# Patient Record
Sex: Female | Born: 1935 | Race: White | Hispanic: No | Smoking: Never smoker
Health system: Southern US, Community
[De-identification: ages and names within clinical notes are randomized; demographics above are authoritative.]

## PROBLEM LIST (undated history)

## (undated) DIAGNOSIS — J45909 Unspecified asthma, uncomplicated: Secondary | ICD-10-CM

## (undated) DIAGNOSIS — M1990 Unspecified osteoarthritis, unspecified site: Secondary | ICD-10-CM

## (undated) DIAGNOSIS — I509 Heart failure, unspecified: Secondary | ICD-10-CM

## (undated) DIAGNOSIS — M25561 Pain in right knee: Secondary | ICD-10-CM

## (undated) DIAGNOSIS — M549 Dorsalgia, unspecified: Secondary | ICD-10-CM

## (undated) DIAGNOSIS — D469 Myelodysplastic syndrome, unspecified: Secondary | ICD-10-CM

## (undated) DIAGNOSIS — E039 Hypothyroidism, unspecified: Secondary | ICD-10-CM

## (undated) DIAGNOSIS — I10 Essential (primary) hypertension: Secondary | ICD-10-CM

## (undated) DIAGNOSIS — I639 Cerebral infarction, unspecified: Secondary | ICD-10-CM

## (undated) DIAGNOSIS — Z8739 Personal history of other diseases of the musculoskeletal system and connective tissue: Secondary | ICD-10-CM

## (undated) DIAGNOSIS — R011 Cardiac murmur, unspecified: Secondary | ICD-10-CM

## (undated) DIAGNOSIS — R12 Heartburn: Secondary | ICD-10-CM

## (undated) DIAGNOSIS — G8929 Other chronic pain: Secondary | ICD-10-CM

## (undated) HISTORY — PX: OTHER SURGICAL HISTORY: SHX169

## (undated) HISTORY — PX: LUMBAR SPINE SURGERY: SHX701

## (undated) HISTORY — PX: TONSILLECTOMY: SUR1361

## (undated) HISTORY — PX: CORONARY ANGIOPLASTY: SHX604

## (undated) HISTORY — DX: Cerebral infarction, unspecified: I63.9

## (undated) HISTORY — PX: KNEE ARTHROSCOPY: SHX127

## (undated) HISTORY — DX: Heartburn: R12

## (undated) HISTORY — DX: Unspecified osteoarthritis, unspecified site: M19.90

---

## 1997-08-12 HISTORY — PX: CORONARY ARTERY BYPASS GRAFT: SHX141

## 1998-01-06 ENCOUNTER — Other Ambulatory Visit: Admission: RE | Admit: 1998-01-06 | Discharge: 1998-01-06 | Payer: Self-pay | Admitting: *Deleted

## 1998-01-09 ENCOUNTER — Inpatient Hospital Stay (HOSPITAL_COMMUNITY): Admission: AD | Admit: 1998-01-09 | Discharge: 1998-01-17 | Payer: Self-pay | Admitting: *Deleted

## 2004-04-09 ENCOUNTER — Encounter: Admission: RE | Admit: 2004-04-09 | Discharge: 2004-04-09 | Payer: Self-pay | Admitting: Neurosurgery

## 2004-04-23 ENCOUNTER — Encounter: Admission: RE | Admit: 2004-04-23 | Discharge: 2004-04-23 | Payer: Self-pay | Admitting: Neurosurgery

## 2004-07-10 ENCOUNTER — Ambulatory Visit: Payer: Self-pay

## 2004-07-13 ENCOUNTER — Ambulatory Visit: Payer: Self-pay

## 2004-08-14 ENCOUNTER — Inpatient Hospital Stay (HOSPITAL_COMMUNITY): Admission: RE | Admit: 2004-08-14 | Discharge: 2004-08-16 | Payer: Self-pay | Admitting: Neurosurgery

## 2005-01-10 ENCOUNTER — Ambulatory Visit: Payer: Self-pay | Admitting: Internal Medicine

## 2005-01-31 ENCOUNTER — Ambulatory Visit: Payer: Self-pay | Admitting: Internal Medicine

## 2005-03-29 ENCOUNTER — Ambulatory Visit: Payer: Self-pay | Admitting: Internal Medicine

## 2005-04-12 ENCOUNTER — Ambulatory Visit: Payer: Self-pay | Admitting: Internal Medicine

## 2005-06-11 ENCOUNTER — Ambulatory Visit: Payer: Self-pay | Admitting: *Deleted

## 2005-08-14 ENCOUNTER — Ambulatory Visit: Payer: Self-pay | Admitting: *Deleted

## 2006-03-25 ENCOUNTER — Ambulatory Visit: Payer: Self-pay | Admitting: Cardiology

## 2006-05-19 ENCOUNTER — Ambulatory Visit: Payer: Self-pay | Admitting: Vascular Surgery

## 2006-05-24 ENCOUNTER — Inpatient Hospital Stay: Payer: Self-pay | Admitting: Internal Medicine

## 2006-05-29 ENCOUNTER — Ambulatory Visit: Payer: Self-pay | Admitting: Internal Medicine

## 2006-07-09 ENCOUNTER — Ambulatory Visit: Payer: Self-pay | Admitting: Internal Medicine

## 2006-07-09 ENCOUNTER — Emergency Department: Payer: Self-pay | Admitting: Emergency Medicine

## 2006-07-09 ENCOUNTER — Other Ambulatory Visit: Payer: Self-pay

## 2006-11-25 ENCOUNTER — Ambulatory Visit: Payer: Self-pay | Admitting: Vascular Surgery

## 2007-05-19 ENCOUNTER — Ambulatory Visit: Payer: Self-pay | Admitting: Vascular Surgery

## 2007-08-07 ENCOUNTER — Ambulatory Visit: Payer: Self-pay | Admitting: Internal Medicine

## 2008-03-25 ENCOUNTER — Ambulatory Visit: Payer: Self-pay | Admitting: Internal Medicine

## 2008-04-25 ENCOUNTER — Encounter: Admission: RE | Admit: 2008-04-25 | Discharge: 2008-04-25 | Payer: Self-pay | Admitting: Neurosurgery

## 2008-06-07 ENCOUNTER — Ambulatory Visit: Payer: Self-pay | Admitting: Vascular Surgery

## 2008-12-17 ENCOUNTER — Emergency Department: Payer: Self-pay | Admitting: Unknown Physician Specialty

## 2009-01-03 ENCOUNTER — Ambulatory Visit: Payer: Self-pay | Admitting: Unknown Physician Specialty

## 2009-01-20 ENCOUNTER — Ambulatory Visit: Payer: Self-pay | Admitting: Internal Medicine

## 2009-01-23 ENCOUNTER — Ambulatory Visit: Payer: Self-pay | Admitting: Pain Medicine

## 2009-01-31 ENCOUNTER — Ambulatory Visit: Payer: Self-pay | Admitting: Pain Medicine

## 2009-02-15 ENCOUNTER — Ambulatory Visit: Payer: Self-pay | Admitting: Physician Assistant

## 2009-02-21 ENCOUNTER — Ambulatory Visit: Payer: Self-pay | Admitting: Pain Medicine

## 2009-03-08 ENCOUNTER — Ambulatory Visit: Payer: Self-pay | Admitting: Physician Assistant

## 2009-03-15 ENCOUNTER — Ambulatory Visit: Payer: Self-pay | Admitting: Pain Medicine

## 2009-03-21 ENCOUNTER — Ambulatory Visit: Payer: Self-pay | Admitting: Pain Medicine

## 2009-04-04 ENCOUNTER — Ambulatory Visit: Payer: Self-pay | Admitting: Physician Assistant

## 2009-04-11 ENCOUNTER — Ambulatory Visit: Payer: Self-pay | Admitting: Pain Medicine

## 2009-04-26 ENCOUNTER — Ambulatory Visit: Payer: Self-pay | Admitting: Physician Assistant

## 2009-06-23 ENCOUNTER — Ambulatory Visit: Payer: Self-pay | Admitting: Vascular Surgery

## 2010-01-17 ENCOUNTER — Ambulatory Visit: Payer: Self-pay | Admitting: Pain Medicine

## 2010-01-25 ENCOUNTER — Ambulatory Visit: Payer: Self-pay | Admitting: Pain Medicine

## 2010-02-21 ENCOUNTER — Ambulatory Visit: Payer: Self-pay | Admitting: Pain Medicine

## 2010-12-28 NOTE — H&P (Signed)
NAMEMARWA, FUHRMAN NO.:  [id number]   MEDICAL RECORD NO.:  [id number]          PATIENT TYPE:  INP   LOCATION:                               FACILITY:  [facility]   PHYSICIAN:  Payton Doughty, M.D.      DATE OF BIRTH:  03-Feb-1929   DATE OF ADMISSION:  08/14/2004  DATE OF DISCHARGE:                                HISTORY & PHYSICAL   ADMITTING DIAGNOSES:  Herniated disk on the right side at L5-S1.   This is a very nice 77 year old right-handed white lady who in early August  in the shower had a funny pain in her back.  Was in her back, down her right  buttock, down her right leg.  She had dorsiflexor weakness in her right leg,  posterior ankle because of foot everters.  Some pain in the left leg.  Bladder has not been affected.  Medical history is remarkable for coronary  artery disease, bypass in 1999.  COPD.  Because of this she underwent  epidural steroids.  Has had an operation which helped transiently.  She now  has back and right leg pain with a 5-1 disk with foraminal component.  Is  admitted for diskectomy.   ALLERGIES:  None.   MEDICATIONS:  1.  Albuterol two puffs per day.  2.  Advair one puff b.i.d.  3.  Astelin spray b.i.d.  4.  Nasonex two sprays.  5.  Singulair.  6.  Fosamax 10 mg a week.  7.  Maxzide once a day.  8.  Lotensin 20 mg a day.  9.  Metoprolol 25 mg a day.  10. Prilosec 20 mg a day.  11. Lipitor 80 mg a day.  12. Flexeril.  13. Percocet one q.6h.   REVIEW OF SYSTEMS:  Remarkable for back and right lower extremity pain.   PHYSICAL EXAMINATION:  HEENT:  Within normal limits.  NECK:  She has reasonable range of motion in her neck.  CHEST:  Clear.  CARDIAC:  Regular rate and rhythm.  ABDOMEN:  Nontender.  No hepatosplenomegaly.  EXTREMITIES:  Without clubbing, cyanosis, edema.  GENITOURINARY:  Deferred.  PERIPHERAL PULSES:  Good.  NEUROLOGIC:  She is awake, alert and oriented.  Cranial nerves are intact.  Motor examination shows  5/5 strength throughout the upper and lower  extremities save for the dorsiflexors of the right foot which are 5-/5 and  she has a right L5 sensory deficits.  Reflexes are 2 at the knees, 1 at the  ankles.  Toes are downgoing bilaterally.  Straight leg raise was negative,  is now positive on the right.   MR shows disk 5-1 with an inferiorly migrated fragment in the right L5  neuroforamen.   CLINICAL IMPRESSION:  Right L5 radiculopathy secondary to 5-1 disk with  foraminal component.  She has failed epidural steroids.  She has cardiac  clearance so plan is for lumbar laminectomy/diskectomy.  The risks and  benefits have been discussed with her and she wished to proceed.        ___________________________________________  Payton Doughty, M.D.  MWR/MEDQ  D:  08/14/2004  T:  08/14/2004  Job:  [id number]

## 2010-12-28 NOTE — Op Note (Signed)
NAMELIYA, STROLLO NO.:  [id number]   MEDICAL RECORD NO.:  [id number]          PATIENT TYPE:  INP   LOCATION:  2899                         FACILITY:  [facility]   PHYSICIAN:  Payton Doughty, M.D.      DATE OF BIRTH:  09/07/1928   DATE OF PROCEDURE:  08/14/2004  DATE OF DISCHARGE:                                 OPERATIVE REPORT   PREOPERATIVE DIAGNOSIS:  Herniated disk, L5-S1 on the right.   POSTOPERATIVE DIAGNOSIS:  Herniated disk, L5-S1 on the right.   PROCEDURE:  Right L5-S1 laminectomy, diskectomy.   SURGEON:  Payton Doughty, M.D.   ANESTHESIA:  General endotracheal.   PREPARATION:  Sterile Betadine prep and scrub with alcohol wipe.   COMPLICATIONS:  None.   BODY OF TEXT:  A 77 year old right-handed white lady with right L5-S1 disk.  She was taken to the operating room and smoothly anesthetized and intubated,  placed prone on the operating table.  Following shave, prep and drape in the  usual sterile fashion, the skin was infiltrated with 1% lidocaine with  1:400,000 epinephrine.  The skin was incised from mid-L4 to mid-S1.  The  underlying laminae were dissected free.  Intraoperative x-ray showed a  marker to be placed under L4.  The diskectomy was carried out in the next  lower lamina, that is, L5.  Hemisemilaminectomy of L5 was carried out on the  right side to the top of the ligamentum flavum that was removed in a  retrograde fashion.  The lateral margin of the S1 root was dissected free  and the disk space identified.  There were some bulging annular fibers that  were divided, and there was also a free fragment of disk lying in the right  neural foramen that was removed without difficulty.  The disk space itself  was explored and all graspable fragments removed.  The wound was irrigated,  hemostasis assured.  The L5 and the S1 nerve roots were explored, found to  be free.  Depo-Medrol-soaked fat was placed in the laminectomy defect.  The  fascia was  reapproximated with 0 Vicryl in interrupted fashion, the  subcutaneous tissue was reapproximated with 0 Vicryl in interrupted fashion.  The skin was closed with 4-0 Vicryl in a running subcuticular fashion.  Benzoin and Steri-Strips were placed, made occlusive with Telfa and OpSite,  and the patient returned to the re in good condition.       MWR/MEDQ  D:  08/14/2004  T:  08/14/2004  Job:  [id number]

## 2011-06-19 ENCOUNTER — Ambulatory Visit: Payer: Self-pay | Admitting: Vascular Surgery

## 2012-02-06 ENCOUNTER — Ambulatory Visit: Payer: Self-pay | Admitting: Pain Medicine

## 2012-03-02 ENCOUNTER — Ambulatory Visit: Payer: Self-pay | Admitting: Pain Medicine

## 2012-03-05 ENCOUNTER — Ambulatory Visit: Payer: Self-pay | Admitting: Pain Medicine

## 2012-03-25 ENCOUNTER — Ambulatory Visit: Payer: Self-pay | Admitting: Pain Medicine

## 2012-03-31 ENCOUNTER — Ambulatory Visit: Payer: Self-pay | Admitting: Pain Medicine

## 2012-04-22 ENCOUNTER — Ambulatory Visit: Payer: Self-pay | Admitting: Pain Medicine

## 2012-05-05 ENCOUNTER — Ambulatory Visit: Payer: Self-pay | Admitting: Pain Medicine

## 2012-05-20 ENCOUNTER — Ambulatory Visit: Payer: Self-pay | Admitting: Pain Medicine

## 2012-06-04 ENCOUNTER — Ambulatory Visit: Payer: Self-pay | Admitting: Pain Medicine

## 2012-06-23 ENCOUNTER — Ambulatory Visit: Payer: Self-pay | Admitting: Pain Medicine

## 2012-07-06 ENCOUNTER — Ambulatory Visit: Payer: Self-pay | Admitting: Pain Medicine

## 2013-04-07 ENCOUNTER — Ambulatory Visit: Payer: Self-pay | Admitting: Ophthalmology

## 2013-04-28 ENCOUNTER — Ambulatory Visit: Payer: Self-pay | Admitting: Ophthalmology

## 2014-07-05 DIAGNOSIS — E039 Hypothyroidism, unspecified: Secondary | ICD-10-CM | POA: Diagnosis present

## 2014-11-01 DIAGNOSIS — G8929 Other chronic pain: Secondary | ICD-10-CM | POA: Diagnosis present

## 2014-11-01 DIAGNOSIS — E7800 Pure hypercholesterolemia, unspecified: Secondary | ICD-10-CM | POA: Insufficient documentation

## 2014-11-01 DIAGNOSIS — M549 Dorsalgia, unspecified: Secondary | ICD-10-CM

## 2014-11-01 DIAGNOSIS — Z951 Presence of aortocoronary bypass graft: Secondary | ICD-10-CM

## 2016-01-10 DIAGNOSIS — I420 Dilated cardiomyopathy: Secondary | ICD-10-CM | POA: Diagnosis present

## 2016-01-10 DIAGNOSIS — Z9889 Other specified postprocedural states: Secondary | ICD-10-CM

## 2016-01-10 DIAGNOSIS — R0602 Shortness of breath: Secondary | ICD-10-CM | POA: Insufficient documentation

## 2016-07-19 ENCOUNTER — Ambulatory Visit (INDEPENDENT_AMBULATORY_CARE_PROVIDER_SITE_OTHER): Payer: Self-pay | Admitting: Vascular Surgery

## 2016-07-19 ENCOUNTER — Encounter (INDEPENDENT_AMBULATORY_CARE_PROVIDER_SITE_OTHER): Payer: Self-pay

## 2016-08-12 DIAGNOSIS — I639 Cerebral infarction, unspecified: Secondary | ICD-10-CM

## 2016-08-12 HISTORY — DX: Cerebral infarction, unspecified: I63.9

## 2016-08-20 ENCOUNTER — Emergency Department (HOSPITAL_COMMUNITY): Payer: Medicare Other

## 2016-08-20 ENCOUNTER — Observation Stay (HOSPITAL_COMMUNITY): Payer: Medicare Other

## 2016-08-20 ENCOUNTER — Encounter (HOSPITAL_COMMUNITY): Payer: Self-pay | Admitting: Emergency Medicine

## 2016-08-20 ENCOUNTER — Inpatient Hospital Stay (HOSPITAL_COMMUNITY)
Admission: EM | Admit: 2016-08-20 | Discharge: 2016-08-22 | DRG: 064 | Disposition: A | Discharge status: Rehabilitation Facility | Payer: Medicare Other | Attending: Internal Medicine | Admitting: Internal Medicine

## 2016-08-20 ENCOUNTER — Inpatient Hospital Stay (HOSPITAL_COMMUNITY): Payer: Medicare Other

## 2016-08-20 DIAGNOSIS — I110 Hypertensive heart disease with heart failure: Secondary | ICD-10-CM | POA: Diagnosis present

## 2016-08-20 DIAGNOSIS — E784 Other hyperlipidemia: Secondary | ICD-10-CM

## 2016-08-20 DIAGNOSIS — I2510 Atherosclerotic heart disease of native coronary artery without angina pectoris: Secondary | ICD-10-CM | POA: Diagnosis present

## 2016-08-20 DIAGNOSIS — I5042 Chronic combined systolic (congestive) and diastolic (congestive) heart failure: Secondary | ICD-10-CM | POA: Diagnosis present

## 2016-08-20 DIAGNOSIS — M4802 Spinal stenosis, cervical region: Secondary | ICD-10-CM | POA: Diagnosis present

## 2016-08-20 DIAGNOSIS — I639 Cerebral infarction, unspecified: Principal | ICD-10-CM

## 2016-08-20 DIAGNOSIS — E785 Hyperlipidemia, unspecified: Secondary | ICD-10-CM | POA: Diagnosis present

## 2016-08-20 DIAGNOSIS — I420 Dilated cardiomyopathy: Secondary | ICD-10-CM | POA: Diagnosis not present

## 2016-08-20 DIAGNOSIS — R402362 Coma scale, best motor response, obeys commands, at arrival to emergency department: Secondary | ICD-10-CM | POA: Diagnosis present

## 2016-08-20 DIAGNOSIS — R4701 Aphasia: Secondary | ICD-10-CM | POA: Diagnosis present

## 2016-08-20 DIAGNOSIS — D72829 Elevated white blood cell count, unspecified: Secondary | ICD-10-CM

## 2016-08-20 DIAGNOSIS — R4781 Slurred speech: Secondary | ICD-10-CM | POA: Diagnosis present

## 2016-08-20 DIAGNOSIS — M810 Age-related osteoporosis without current pathological fracture: Secondary | ICD-10-CM | POA: Insufficient documentation

## 2016-08-20 DIAGNOSIS — J441 Chronic obstructive pulmonary disease with (acute) exacerbation: Secondary | ICD-10-CM | POA: Diagnosis present

## 2016-08-20 DIAGNOSIS — J449 Chronic obstructive pulmonary disease, unspecified: Secondary | ICD-10-CM

## 2016-08-20 DIAGNOSIS — I10 Essential (primary) hypertension: Secondary | ICD-10-CM | POA: Diagnosis present

## 2016-08-20 DIAGNOSIS — R29810 Facial weakness: Secondary | ICD-10-CM | POA: Diagnosis present

## 2016-08-20 DIAGNOSIS — Z951 Presence of aortocoronary bypass graft: Secondary | ICD-10-CM | POA: Diagnosis not present

## 2016-08-20 DIAGNOSIS — Z7982 Long term (current) use of aspirin: Secondary | ICD-10-CM

## 2016-08-20 DIAGNOSIS — R402142 Coma scale, eyes open, spontaneous, at arrival to emergency department: Secondary | ICD-10-CM | POA: Diagnosis present

## 2016-08-20 DIAGNOSIS — Z955 Presence of coronary angioplasty implant and graft: Secondary | ICD-10-CM

## 2016-08-20 DIAGNOSIS — J45909 Unspecified asthma, uncomplicated: Secondary | ICD-10-CM | POA: Diagnosis present

## 2016-08-20 DIAGNOSIS — I509 Heart failure, unspecified: Secondary | ICD-10-CM | POA: Diagnosis not present

## 2016-08-20 DIAGNOSIS — R402222 Coma scale, best verbal response, incomprehensible words, at arrival to emergency department: Secondary | ICD-10-CM | POA: Diagnosis present

## 2016-08-20 DIAGNOSIS — I6350 Cerebral infarction due to unspecified occlusion or stenosis of unspecified cerebral artery: Secondary | ICD-10-CM | POA: Diagnosis not present

## 2016-08-20 DIAGNOSIS — E039 Hypothyroidism, unspecified: Secondary | ICD-10-CM | POA: Diagnosis present

## 2016-08-20 DIAGNOSIS — G8191 Hemiplegia, unspecified affecting right dominant side: Secondary | ICD-10-CM | POA: Diagnosis present

## 2016-08-20 DIAGNOSIS — G8929 Other chronic pain: Secondary | ICD-10-CM | POA: Diagnosis present

## 2016-08-20 DIAGNOSIS — I25810 Atherosclerosis of coronary artery bypass graft(s) without angina pectoris: Secondary | ICD-10-CM

## 2016-08-20 DIAGNOSIS — I5022 Chronic systolic (congestive) heart failure: Secondary | ICD-10-CM | POA: Diagnosis not present

## 2016-08-20 DIAGNOSIS — I69398 Other sequelae of cerebral infarction: Secondary | ICD-10-CM | POA: Diagnosis not present

## 2016-08-20 DIAGNOSIS — Z66 Do not resuscitate: Secondary | ICD-10-CM | POA: Diagnosis present

## 2016-08-20 DIAGNOSIS — R1310 Dysphagia, unspecified: Secondary | ICD-10-CM | POA: Diagnosis present

## 2016-08-20 DIAGNOSIS — R269 Unspecified abnormalities of gait and mobility: Secondary | ICD-10-CM | POA: Diagnosis not present

## 2016-08-20 DIAGNOSIS — D469 Myelodysplastic syndrome, unspecified: Secondary | ICD-10-CM | POA: Diagnosis present

## 2016-08-20 DIAGNOSIS — R471 Dysarthria and anarthria: Secondary | ICD-10-CM | POA: Diagnosis present

## 2016-08-20 DIAGNOSIS — R531 Weakness: Secondary | ICD-10-CM

## 2016-08-20 DIAGNOSIS — I63312 Cerebral infarction due to thrombosis of left middle cerebral artery: Secondary | ICD-10-CM

## 2016-08-20 DIAGNOSIS — D62 Acute posthemorrhagic anemia: Secondary | ICD-10-CM | POA: Diagnosis not present

## 2016-08-20 DIAGNOSIS — E038 Other specified hypothyroidism: Secondary | ICD-10-CM | POA: Diagnosis not present

## 2016-08-20 DIAGNOSIS — M549 Dorsalgia, unspecified: Secondary | ICD-10-CM

## 2016-08-20 DIAGNOSIS — Z9889 Other specified postprocedural states: Secondary | ICD-10-CM

## 2016-08-20 DIAGNOSIS — R739 Hyperglycemia, unspecified: Secondary | ICD-10-CM | POA: Diagnosis present

## 2016-08-20 DIAGNOSIS — I5032 Chronic diastolic (congestive) heart failure: Secondary | ICD-10-CM | POA: Diagnosis not present

## 2016-08-20 DIAGNOSIS — E876 Hypokalemia: Secondary | ICD-10-CM | POA: Diagnosis not present

## 2016-08-20 HISTORY — DX: Unspecified asthma, uncomplicated: J45.909

## 2016-08-20 HISTORY — DX: Other chronic pain: G89.29

## 2016-08-20 HISTORY — DX: Heart failure, unspecified: I50.9

## 2016-08-20 HISTORY — DX: Essential (primary) hypertension: I10

## 2016-08-20 HISTORY — DX: Cardiac murmur, unspecified: R01.1

## 2016-08-20 HISTORY — DX: Myelodysplastic syndrome, unspecified: D46.9

## 2016-08-20 HISTORY — DX: Dorsalgia, unspecified: M54.9

## 2016-08-20 HISTORY — DX: Personal history of other diseases of the musculoskeletal system and connective tissue: Z87.39

## 2016-08-20 HISTORY — DX: Hypothyroidism, unspecified: E03.9

## 2016-08-20 HISTORY — DX: Pain in right knee: M25.561

## 2016-08-20 LAB — RAPID URINE DRUG SCREEN, HOSP PERFORMED
Amphetamines: NOT DETECTED
Barbiturates: NOT DETECTED
Benzodiazepines: NOT DETECTED
Cocaine: NOT DETECTED
Opiates: NOT DETECTED
Tetrahydrocannabinol: NOT DETECTED

## 2016-08-20 LAB — COMPREHENSIVE METABOLIC PANEL
ALT: 12 U/L — ABNORMAL LOW (ref 14–54)
AST: 22 U/L (ref 15–41)
Albumin: 3.8 g/dL (ref 3.5–5.0)
Alkaline Phosphatase: 77 U/L (ref 38–126)
Anion gap: 9 (ref 5–15)
BUN: 8 mg/dL (ref 6–20)
CO2: 26 mmol/L (ref 22–32)
Calcium: 9.4 mg/dL (ref 8.9–10.3)
Chloride: 100 mmol/L — ABNORMAL LOW (ref 101–111)
Creatinine, Ser: 0.9 mg/dL (ref 0.44–1.00)
GFR calc Af Amer: >60 mL/min (ref >60)
GFR calc non Af Amer: 57 mL/min — ABNORMAL LOW (ref >60)
Glucose, Bld: 120 mg/dL — ABNORMAL HIGH (ref 65–99)
Potassium: 3.8 mmol/L (ref 3.5–5.1)
Sodium: 135 mmol/L (ref 135–145)
Total Bilirubin: 0.6 mg/dL (ref 0.3–1.2)
Total Protein: 7.4 g/dL (ref 6.5–8.1)

## 2016-08-20 LAB — URINALYSIS, ROUTINE W REFLEX MICROSCOPIC
Bilirubin Urine: NEGATIVE
Glucose, UA: NEGATIVE mg/dL
Ketones, ur: NEGATIVE mg/dL
Nitrite: NEGATIVE
Protein, ur: NEGATIVE mg/dL
Specific Gravity, Urine: 1.028 (ref 1.005–1.030)
pH: 7 (ref 5.0–8.0)

## 2016-08-20 LAB — ECHOCARDIOGRAM COMPLETE
Height: 61.5 in
Weight: 2288 oz

## 2016-08-20 LAB — CBC WITH DIFFERENTIAL/PLATELET
Basophils Absolute: 0.1 10*3/uL (ref 0.0–0.1)
Basophils Relative: 1 %
Eosinophils Absolute: 0.3 10*3/uL (ref 0.0–0.7)
Eosinophils Relative: 3 %
HCT: 36.1 % (ref 36.0–46.0)
Hemoglobin: 12.3 g/dL (ref 12.0–15.0)
Lymphocytes Relative: 19 %
Lymphs Abs: 2.1 10*3/uL (ref 0.7–4.0)
MCH: 32.9 pg (ref 26.0–34.0)
MCHC: 34.1 g/dL (ref 30.0–36.0)
MCV: 96.5 fL (ref 78.0–100.0)
Monocytes Absolute: 0.8 10*3/uL (ref 0.1–1.0)
Monocytes Relative: 7 %
Neutro Abs: 8 10*3/uL — ABNORMAL HIGH (ref 1.7–7.7)
Neutrophils Relative %: 72 %
Platelets: 209 10*3/uL (ref 150–400)
RBC: 3.74 MIL/uL — ABNORMAL LOW (ref 3.87–5.11)
RDW: 11.9 % (ref 11.5–15.5)
WBC: 11.2 10*3/uL — ABNORMAL HIGH (ref 4.0–10.5)

## 2016-08-20 LAB — APTT: aPTT: 30 seconds (ref 24–36)

## 2016-08-20 LAB — PROTIME-INR
INR: 1.02
INR: 1.01
Prothrombin Time: 13.4 seconds (ref 11.4–15.2)
Prothrombin Time: 13.4 seconds (ref 11.4–15.2)

## 2016-08-20 LAB — I-STAT TROPONIN, ED
Troponin i, poc: 0.01 ng/mL (ref 0.00–0.08)
Troponin i, poc: 0.01 ng/mL (ref 0.00–0.08)

## 2016-08-20 LAB — TROPONIN I: Troponin I: <0.03 ng/mL (ref <0.03)

## 2016-08-20 MED ORDER — ALBUTEROL SULFATE HFA 108 (90 BASE) MCG/ACT IN AERS: 1.0000 | INHALATION_SPRAY | RESPIRATORY_TRACT | Status: DC | PRN

## 2016-08-20 MED ORDER — HEPARIN SODIUM (PORCINE) 5000 UNIT/ML IJ SOLN
5000.0000 [IU] | Freq: Three times a day (TID) | INTRAMUSCULAR | Status: DC
  Administered 2016-08-20 – 2016-08-22 (×6): 5000 [IU] via SUBCUTANEOUS
  Filled 2016-08-20 (×6): qty 1

## 2016-08-20 MED ORDER — PERFLUTREN LIPID MICROSPHERE
1.0000 mL | INTRAVENOUS | Status: AC | PRN
  Administered 2016-08-20: 4 mL via INTRAVENOUS
  Filled 2016-08-20: qty 10

## 2016-08-20 MED ORDER — SODIUM CHLORIDE 0.9 % IV SOLN
INTRAVENOUS | Status: DC
  Administered 2016-08-20: 19:00:00 via INTRAVENOUS

## 2016-08-20 MED ORDER — SIMVASTATIN 40 MG PO TABS
40.0000 mg | ORAL_TABLET | Freq: Every day | ORAL | Status: DC
  Administered 2016-08-21 – 2016-08-22 (×2): 40 mg via ORAL
  Filled 2016-08-20 (×3): qty 1

## 2016-08-20 MED ORDER — LISINOPRIL 20 MG PO TABS
40.0000 mg | ORAL_TABLET | Freq: Every day | ORAL | Status: DC
  Administered 2016-08-21 – 2016-08-22 (×2): 40 mg via ORAL
  Filled 2016-08-20 (×2): qty 2

## 2016-08-20 MED ORDER — SENNOSIDES-DOCUSATE SODIUM 8.6-50 MG PO TABS: 1.0000 | ORAL_TABLET | Freq: Every evening | ORAL | Status: DC | PRN

## 2016-08-20 MED ORDER — LEVOTHYROXINE SODIUM 88 MCG PO TABS
88.0000 ug | ORAL_TABLET | Freq: Every day | ORAL | Status: DC
  Administered 2016-08-21 – 2016-08-22 (×2): 88 ug via ORAL
  Filled 2016-08-20 (×2): qty 1

## 2016-08-20 MED ORDER — ASPIRIN 325 MG PO TABS
325.0000 mg | ORAL_TABLET | Freq: Every day | ORAL | Status: DC
  Administered 2016-08-21: 325 mg via ORAL
  Filled 2016-08-20: qty 1

## 2016-08-20 MED ORDER — FAMOTIDINE 20 MG PO TABS
20.0000 mg | ORAL_TABLET | Freq: Two times a day (BID) | ORAL | Status: DC
  Administered 2016-08-21 – 2016-08-22 (×3): 20 mg via ORAL
  Filled 2016-08-20 (×3): qty 1

## 2016-08-20 MED ORDER — ACETAMINOPHEN 325 MG PO TABS
650.0000 mg | ORAL_TABLET | ORAL | Status: DC | PRN
  Administered 2016-08-22: 650 mg via ORAL
  Filled 2016-08-20: qty 2

## 2016-08-20 MED ORDER — ACETAMINOPHEN 650 MG RE SUPP: 650.0000 mg | RECTAL | Status: DC | PRN

## 2016-08-20 MED ORDER — ACETAMINOPHEN 160 MG/5ML PO SOLN: 650.0000 mg | ORAL | Status: DC | PRN

## 2016-08-20 MED ORDER — IOPAMIDOL (ISOVUE-370) INJECTION 76%
INTRAVENOUS | Status: AC
  Filled 2016-08-20: qty 50

## 2016-08-20 MED ORDER — HYDRALAZINE HCL 20 MG/ML IJ SOLN: 5.0000 mg | Freq: Three times a day (TID) | INTRAMUSCULAR | Status: DC | PRN

## 2016-08-20 MED ORDER — IOPAMIDOL (ISOVUE-370) INJECTION 76%
INTRAVENOUS | Status: AC
  Administered 2016-08-20: 100 mL
  Filled 2016-08-20: qty 50

## 2016-08-20 MED ORDER — STROKE: EARLY STAGES OF RECOVERY BOOK
Freq: Once | Status: AC
  Administered 2016-08-21: 03:00:00
  Filled 2016-08-20: qty 1

## 2016-08-20 MED ORDER — METOPROLOL SUCCINATE ER 50 MG PO TB24
50.0000 mg | ORAL_TABLET | Freq: Every day | ORAL | Status: DC
  Administered 2016-08-21 – 2016-08-22 (×2): 50 mg via ORAL
  Filled 2016-08-20 (×2): qty 1

## 2016-08-20 NOTE — ED Notes (Signed)
  Pt transported to ct 

## 2016-08-20 NOTE — ED Triage Notes (Signed)
To ED via [place] from home -- lives by herself-- called her daughter this am and had slurred speech. On EMS arrival-- right sided weakness, slurred speech, -

## 2016-08-20 NOTE — ED Notes (Signed)
Echo at bedside

## 2016-08-20 NOTE — Care Management Note (Signed)
Case Management Note  Patient Details  Name: Yvonne Parrish MRN: [id number] Date of Birth: 1928-08-13  Subjective/Objective:                  From home. /83 year old female with a past medical history of coronary artery disease status post CABG and stents, systolic heart failure, hypertension, hyperlipidemia who presents today with right-sided weakness and speech deficits.   Action/Plan: Follow for disposition needs. /Admit to INPATIENT (Acute-sided right sided  Weakness with dysarthria and dysphasia,  Suspect Acute CVA); anticipate discharge [place].    Expected Discharge Date:  08/24/16               Expected Discharge Plan:  [place]  In-House Referral:  NA  Discharge planning Services  CM Consult   Status of Service:  In process, will continue to follow    Additional Comments:  Fuller Mandril, RN 08/20/2016, 2:48 PM

## 2016-08-20 NOTE — ED Notes (Signed)
Pt back to CT for CTP/CTA.

## 2016-08-20 NOTE — ED Notes (Signed)
Patient transported to MRI 

## 2016-08-20 NOTE — Progress Notes (Signed)
Patient admitted to the unit from ED. Patient aqlerty and oriented x 4. Tele placed. Patient made comfortable.

## 2016-08-20 NOTE — ED Notes (Signed)
Returned from CT.

## 2016-08-20 NOTE — ED Notes (Addendum)
Placed patient into a gown and on the monitor did ekg and vitals

## 2016-08-20 NOTE — ED Notes (Signed)
Attempted report x1. 

## 2016-08-20 NOTE — ED Provider Notes (Signed)
[facility] DEPT Provider Note   CSN: [id number] Arrival date & time: 08/20/16  1012     History   Chief Complaint Chief Complaint  Patient presents with  . Aphasia  . right sided weakness    HPI Yvonne Parrish is a 83 y.o. female.  The history is provided by the patient and the EMS personnel. The history is limited by the condition of the patient.     83 year old female with a past medical history of coronary artery disease status post CABG and stents, systolic heart failure, hypertension, hyperlipidemia who presents today with right-sided weakness and speech deficits. Patient states symptom onset was this morning when she woke up. Patient's speech is difficult to understand but it sounds like she has had similar strokelike symptoms in the past that seem more consistent with TIA. She denies any chest pain, shortness of breath, numbness. Patient also had concerns for ST elevations in her lateral leads with depressions in 1 and aVL.  History is limited due to the patient's abnormal speech. Daughter is apparently in route and we will try to obtain more history from her on arrival.  Past Medical History:  Diagnosis Date  . Blood dyscrasia   . CHF (congestive heart failure) ([place])   . COPD (chronic obstructive pulmonary disease) ([place])   . Heart murmur   . Hypertension   . Hypothyroidism     Patient Active Problem List   Diagnosis Date Noted  . COPD exacerbation ([place]) 08/20/2016  . Aphasia 08/20/2016  . Asthma without status asthmaticus 08/20/2016  . CHF (congestive heart failure) ([place]) 08/20/2016  . Hyperlipidemia, unspecified 08/20/2016  . Hypertension 08/20/2016  . Myelodysplastic syndrome ([place]) 08/20/2016  . Osteoporosis, post-menopausal 08/20/2016  . Cardiomyopathy, dilated ([place]) 01/10/2016  . H/O cardiac catheterization 01/10/2016  . Chronic back pain 11/01/2014  . History of coronary artery bypass graft 11/01/2014  . Acquired hypothyroidism 07/05/2014    Past  Surgical History:  Procedure Laterality Date  . CORONARY ANGIOPLASTY    . CORONARY ARTERY BYPASS GRAFT  1999  . KNEE ARTHROSCOPY      OB History    No data available       Home Medications    Prior to Admission medications   Not on File    Family History Family History  Problem Relation Age of Onset  . Stroke Mother   . Heart disease Father     Social History Social History  Substance Use Topics  . Smoking status: Never Smoker  . Smokeless tobacco: Never Used  . Alcohol use No     Allergies   Patient has no allergy information on record.   Review of Systems Review of Systems  Constitutional: Negative for chills and fever.  Cardiovascular: Negative for chest pain and leg swelling.  Gastrointestinal: Negative for nausea and vomiting.  Neurological: Positive for speech difficulty and weakness (RUE, RLE). Negative for numbness and headaches.  All other systems reviewed and are negative.    Physical Exam Updated Vital Signs BP (!) 128/41   Pulse 77   Temp 99.1 F (37.3 C) (Oral)   Resp 13   Ht 5' 1.5" (1.562 m)   Wt 64.9 kg   SpO2 97%   BMI 26.58 kg/m   Physical Exam  Constitutional: No distress.  HENT:  Head: Normocephalic and atraumatic.  Right Ear: External ear normal.  Left Ear: External ear normal.  Eyes: Conjunctivae and EOM are normal.  Neck: Neck supple.  Cardiovascular: Normal rate and regular  rhythm.   Pulmonary/Chest: Effort normal and breath sounds normal. No respiratory distress.  Abdominal: Soft. There is no tenderness.  Musculoskeletal: She exhibits no edema.  Neurological: She is alert. No cranial nerve deficit (no clear CN deficit on my exam) or sensory deficit. Coordination (unsteady R finger to nose, L side normal) abnormal. GCS eye subscore is 4. GCS verbal subscore is 2. GCS motor subscore is 6.  2/5 strength in RLE, 3/5 in RUE  Skin: Skin is warm and dry. She is not diaphoretic.  Psychiatric: She has a normal mood and  affect.  Nursing note and vitals reviewed.    ED Treatments / Results  Labs (all labs ordered are listed, but only abnormal results are displayed) Labs Reviewed  CBC WITH DIFFERENTIAL/PLATELET - Abnormal; Notable for the following:       Result Value   WBC 11.2 (*)    RBC 3.74 (*)    Neutro Abs 8.0 (*)    All other components within normal limits  COMPREHENSIVE METABOLIC PANEL - Abnormal; Notable for the following:    Chloride 100 (*)    Glucose, Bld 120 (*)    ALT 12 (*)    GFR calc non Af Amer 57 (*)    All other components within normal limits  CULTURE, BLOOD (ROUTINE X 2)  CULTURE, BLOOD (ROUTINE X 2)  PROTIME-INR  PROTIME-INR  APTT  TROPONIN I  RAPID URINE DRUG SCREEN, HOSP PERFORMED  URINALYSIS, ROUTINE W REFLEX MICROSCOPIC  I-STAT TROPOININ, ED  I-STAT TROPOININ, ED    EKG  EKG Interpretation  Date/Time:  Tuesday August 20 2016 10:19:38 EST Ventricular Rate:  72 PR Interval:    QRS Duration: 138 QT Interval:  423 QTC Calculation: 463 R Axis:   -58 Text Interpretation:  Sinus or ectopic atrial rhythm Borderline prolonged PR interval Left bundle branch block No STEMI Confirmed by Sherry Ruffing MD, CHRISTOPHER [phone]) on 08/20/2016 12:04:34 PM       Radiology Ct Angio Head W Or Wo Contrast  Result Date: 08/20/2016 CLINICAL DATA:  83 year old hypertensive female with right-sided facial droop and right leg weakness. Last seen normal at midnight. Subsequent encounter. EXAM: CT ANGIOGRAPHY HEAD AND NECK. CT PERFUSION BRAIN TECHNIQUE: Multiphase CT imaging of the brain was performed following IV bolus contrast injection. Subsequent parametric perfusion maps were calculated using RAPID software. Multidetector CT imaging of the head and neck was performed using the standard protocol during bolus administration of intravenous contrast. Multiplanar CT image reconstructions and MIPs were obtained to evaluate the vascular anatomy. Carotid stenosis measurements (when applicable)  are obtained utilizing NASCET criteria, using the distal internal carotid diameter as the denominator. CONTRAST:  100 cc Isovue 370. COMPARISON:  08/20/2016 head CT. FINDINGS: CT Brain Perfusion Findings: CBF (<30%) Volume: 9mL Perfusion (Tmax>6.0s) volume: 78mL Mismatch Volume: 55mL Infarction Location:Not identified CT HEAD FINDINGS Brain: No intracranial hemorrhage or CT evidence of large acute infarct. No intracranial mass or abnormal enhancement. No hydrocephalus. Vascular: As below. Skull: Negative. Sinuses: Minimal ethmoid sinus air cell mucosal thickening. Orbits: Post lens replacement without acute abnormality. Review of the MIP images confirms the above findings CTA NECK FINDINGS Aortic arch: 4 vessel arch with left vertebral artery arising directly from the arch. Mild to slightly moderate narrowing origin of the left vertebral artery and left subclavian artery. Ascending thoracic aorta measures up to 3.9 cm. Post CABG. One of the jumped grafts is occluded. Mild dilation at the arch/proximal descending thoracic aortic junction with prominent complex plaque in small ulcerations.  Right carotid system: Plaque and ectasia involving portions of the right common carotid artery and internal carotid artery. Less than 60% diameter narrowing. Left carotid system: Plaque and ectasia involving portions of the left common carotid artery and internal carotid artery. Less than 60% diameter narrowing. Vertebral arteries: Left vertebral artery arises directly from the aortic arch with mild slightly moderate narrowing proximal aspect. Moderate narrowing proximal right subclavian artery with complex plaque. Moderate narrowing origin of the right vertebral artery. Mild narrowing vertebral arteries at the C1-2 level. Skeleton: Cervical spondylotic changes most notable C6-7 followed by the C3-4 level. Remote Schmorl's node deformity and 20% loss of height superior endplate T5. Left lobe of thyroid gland extends slightly  inferiorly but without worrisome mass. Mild symmetric prominence lingual tonsillar tissue with pulled secretions vallecula. Right carotid artery impresses upon the posterior aspect of the pharynx. Upper chest: Scattered small calcified granulomas. Review of the MIP images confirms the above findings CTA HEAD FINDINGS Anterior circulation: Anterior circulation without medium or large size vessel significant stenosis or occlusion. Cavernous segment internal carotid artery calcification ectasia with mild narrowing and dilation without high-grade stenosis. Posterior circulation: Calcified vertebral arteries with mild slightly moderate narrowing at the level the foramen magnum. No significant stenosis of the basilar artery. Moderate narrowing proximal right posterior cerebral artery. Bilateral posterior cerebral artery distal branch vessel mild narrowing and irregularity. Venous sinuses: Patent. Anatomic variants: Fetal contribution to the right posterior cerebral artery. Delayed phase: As above. Review of the MIP images confirms the above findings IMPRESSION: CT Brain Perfusion CBF (<30%) Volume: 97mL Perfusion (Tmax>6.0s) volume: 55mL Mismatch Volume: 95mL CT HEAD No intracranial hemorrhage or CT evidence of large acute infarct. CTA NECK FINDINGS Ascending thoracic aorta measures up to 3.9 cm. Recommend annual imaging followup by CTA or MRA. This recommendation follows 2010 ACCF/AHA/AATS/ACR/ASA/SCA/SCAI/SIR/STS/SVM Guidelines for the Diagnosis and Management of Patients with Thoracic Aortic Disease. Circulation.2010; [id number] Post CABG. One of the jumped grafts is occluded. Mild dilation at the arch/proximal descending thoracic aortic junction with prominent complex plaque with small ULCERATIONS. Plaque and ectasia involving portions both common carotid arteries and both internal carotid arteries. Less than 60% diameter narrowing. Left vertebral artery arises directly from the aortic arch with mild to slightly  moderate narrowing proximal aspect. Moderate narrowing proximal right subclavian artery with complex plaque. Moderate narrowing origin of the right vertebral artery. Mild narrowing vertebral arteries at the C1-2 level. CTA HEAD Anterior circulation without medium or large size vessel significant stenosis or occlusion. Calcified vertebral arteries with mild to slightly moderate narrowing at the level the foramen magnum. No significant stenosis of the basilar artery. Moderate narrowing proximal right posterior cerebral artery. Bilateral posterior cerebral artery distal branch vessel mild narrowing and irregularity. Electronically Signed   By: Genia Del M.D.   On: 08/20/2016 12:34   Ct Head Wo Contrast  Result Date: 08/20/2016 CLINICAL DATA:  Right-sided weakness, slurred speech. EXAM: CT HEAD WITHOUT CONTRAST TECHNIQUE: Contiguous axial images were obtained from the base of the skull through the vertex without intravenous contrast. COMPARISON:  None. FINDINGS: Brain: Minimal chronic ischemic white matter disease is noted. No mass effect or midline shift is noted. Ventricular size is within normal limits. There is no evidence of mass lesion, hemorrhage or acute infarction. Vascular: Atherosclerosis of carotid siphons is noted. Skull: Normal. Negative for fracture or focal lesion. Sinuses/Orbits: No acute finding. Other: None. IMPRESSION: Minimal chronic ischemic white matter disease. No acute intracranial abnormality seen. Electronically Signed   By: Jeneen Rinks  Murlean Caller, M.D.   On: 08/20/2016 10:55   Ct Angio Neck W And/or Wo Contrast  Result Date: 08/20/2016 CLINICAL DATA:  83 year old hypertensive female with right-sided facial droop and right leg weakness. Last seen normal at midnight. Subsequent encounter. EXAM: CT ANGIOGRAPHY HEAD AND NECK. CT PERFUSION BRAIN TECHNIQUE: Multiphase CT imaging of the brain was performed following IV bolus contrast injection. Subsequent parametric perfusion maps were calculated  using RAPID software. Multidetector CT imaging of the head and neck was performed using the standard protocol during bolus administration of intravenous contrast. Multiplanar CT image reconstructions and MIPs were obtained to evaluate the vascular anatomy. Carotid stenosis measurements (when applicable) are obtained utilizing NASCET criteria, using the distal internal carotid diameter as the denominator. CONTRAST:  100 cc Isovue 370. COMPARISON:  08/20/2016 head CT. FINDINGS: CT Brain Perfusion Findings: CBF (<30%) Volume: 21mL Perfusion (Tmax>6.0s) volume: 67mL Mismatch Volume: 43mL Infarction Location:Not identified CT HEAD FINDINGS Brain: No intracranial hemorrhage or CT evidence of large acute infarct. No intracranial mass or abnormal enhancement. No hydrocephalus. Vascular: As below. Skull: Negative. Sinuses: Minimal ethmoid sinus air cell mucosal thickening. Orbits: Post lens replacement without acute abnormality. Review of the MIP images confirms the above findings CTA NECK FINDINGS Aortic arch: 4 vessel arch with left vertebral artery arising directly from the arch. Mild to slightly moderate narrowing origin of the left vertebral artery and left subclavian artery. Ascending thoracic aorta measures up to 3.9 cm. Post CABG. One of the jumped grafts is occluded. Mild dilation at the arch/proximal descending thoracic aortic junction with prominent complex plaque in small ulcerations. Right carotid system: Plaque and ectasia involving portions of the right common carotid artery and internal carotid artery. Less than 60% diameter narrowing. Left carotid system: Plaque and ectasia involving portions of the left common carotid artery and internal carotid artery. Less than 60% diameter narrowing. Vertebral arteries: Left vertebral artery arises directly from the aortic arch with mild slightly moderate narrowing proximal aspect. Moderate narrowing proximal right subclavian artery with complex plaque. Moderate narrowing  origin of the right vertebral artery. Mild narrowing vertebral arteries at the C1-2 level. Skeleton: Cervical spondylotic changes most notable C6-7 followed by the C3-4 level. Remote Schmorl's node deformity and 20% loss of height superior endplate T5. Left lobe of thyroid gland extends slightly inferiorly but without worrisome mass. Mild symmetric prominence lingual tonsillar tissue with pulled secretions vallecula. Right carotid artery impresses upon the posterior aspect of the pharynx. Upper chest: Scattered small calcified granulomas. Review of the MIP images confirms the above findings CTA HEAD FINDINGS Anterior circulation: Anterior circulation without medium or large size vessel significant stenosis or occlusion. Cavernous segment internal carotid artery calcification ectasia with mild narrowing and dilation without high-grade stenosis. Posterior circulation: Calcified vertebral arteries with mild slightly moderate narrowing at the level the foramen magnum. No significant stenosis of the basilar artery. Moderate narrowing proximal right posterior cerebral artery. Bilateral posterior cerebral artery distal branch vessel mild narrowing and irregularity. Venous sinuses: Patent. Anatomic variants: Fetal contribution to the right posterior cerebral artery. Delayed phase: As above. Review of the MIP images confirms the above findings IMPRESSION: CT Brain Perfusion CBF (<30%) Volume: 79mL Perfusion (Tmax>6.0s) volume: 40mL Mismatch Volume: 58mL CT HEAD No intracranial hemorrhage or CT evidence of large acute infarct. CTA NECK FINDINGS Ascending thoracic aorta measures up to 3.9 cm. Recommend annual imaging followup by CTA or MRA. This recommendation follows 2010 ACCF/AHA/AATS/ACR/ASA/SCA/SCAI/SIR/STS/SVM Guidelines for the Diagnosis and Management of Patients with Thoracic Aortic Disease. Circulation.2010; 121: [id number]  Post CABG. One of the jumped grafts is occluded. Mild dilation at the arch/proximal descending  thoracic aortic junction with prominent complex plaque with small ULCERATIONS. Plaque and ectasia involving portions both common carotid arteries and both internal carotid arteries. Less than 60% diameter narrowing. Left vertebral artery arises directly from the aortic arch with mild to slightly moderate narrowing proximal aspect. Moderate narrowing proximal right subclavian artery with complex plaque. Moderate narrowing origin of the right vertebral artery. Mild narrowing vertebral arteries at the C1-2 level. CTA HEAD Anterior circulation without medium or large size vessel significant stenosis or occlusion. Calcified vertebral arteries with mild to slightly moderate narrowing at the level the foramen magnum. No significant stenosis of the basilar artery. Moderate narrowing proximal right posterior cerebral artery. Bilateral posterior cerebral artery distal branch vessel mild narrowing and irregularity. Electronically Signed   By: Genia Del M.D.   On: 08/20/2016 12:34   Ct Cerebral Perfusion W Contrast  Result Date: 08/20/2016 CLINICAL DATA:  83 year old hypertensive female with right-sided facial droop and right leg weakness. Last seen normal at midnight. Subsequent encounter. EXAM: CT ANGIOGRAPHY HEAD AND NECK. CT PERFUSION BRAIN TECHNIQUE: Multiphase CT imaging of the brain was performed following IV bolus contrast injection. Subsequent parametric perfusion maps were calculated using RAPID software. Multidetector CT imaging of the head and neck was performed using the standard protocol during bolus administration of intravenous contrast. Multiplanar CT image reconstructions and MIPs were obtained to evaluate the vascular anatomy. Carotid stenosis measurements (when applicable) are obtained utilizing NASCET criteria, using the distal internal carotid diameter as the denominator. CONTRAST:  100 cc Isovue 370. COMPARISON:  08/20/2016 head CT. FINDINGS: CT Brain Perfusion Findings: CBF (<30%) Volume: 51mL  Perfusion (Tmax>6.0s) volume: 28mL Mismatch Volume: 64mL Infarction Location:Not identified CT HEAD FINDINGS Brain: No intracranial hemorrhage or CT evidence of large acute infarct. No intracranial mass or abnormal enhancement. No hydrocephalus. Vascular: As below. Skull: Negative. Sinuses: Minimal ethmoid sinus air cell mucosal thickening. Orbits: Post lens replacement without acute abnormality. Review of the MIP images confirms the above findings CTA NECK FINDINGS Aortic arch: 4 vessel arch with left vertebral artery arising directly from the arch. Mild to slightly moderate narrowing origin of the left vertebral artery and left subclavian artery. Ascending thoracic aorta measures up to 3.9 cm. Post CABG. One of the jumped grafts is occluded. Mild dilation at the arch/proximal descending thoracic aortic junction with prominent complex plaque in small ulcerations. Right carotid system: Plaque and ectasia involving portions of the right common carotid artery and internal carotid artery. Less than 60% diameter narrowing. Left carotid system: Plaque and ectasia involving portions of the left common carotid artery and internal carotid artery. Less than 60% diameter narrowing. Vertebral arteries: Left vertebral artery arises directly from the aortic arch with mild slightly moderate narrowing proximal aspect. Moderate narrowing proximal right subclavian artery with complex plaque. Moderate narrowing origin of the right vertebral artery. Mild narrowing vertebral arteries at the C1-2 level. Skeleton: Cervical spondylotic changes most notable C6-7 followed by the C3-4 level. Remote Schmorl's node deformity and 20% loss of height superior endplate T5. Left lobe of thyroid gland extends slightly inferiorly but without worrisome mass. Mild symmetric prominence lingual tonsillar tissue with pulled secretions vallecula. Right carotid artery impresses upon the posterior aspect of the pharynx. Upper chest: Scattered small calcified  granulomas. Review of the MIP images confirms the above findings CTA HEAD FINDINGS Anterior circulation: Anterior circulation without medium or large size vessel significant stenosis or occlusion. Cavernous segment internal  carotid artery calcification ectasia with mild narrowing and dilation without high-grade stenosis. Posterior circulation: Calcified vertebral arteries with mild slightly moderate narrowing at the level the foramen magnum. No significant stenosis of the basilar artery. Moderate narrowing proximal right posterior cerebral artery. Bilateral posterior cerebral artery distal branch vessel mild narrowing and irregularity. Venous sinuses: Patent. Anatomic variants: Fetal contribution to the right posterior cerebral artery. Delayed phase: As above. Review of the MIP images confirms the above findings IMPRESSION: CT Brain Perfusion CBF (<30%) Volume: 14mL Perfusion (Tmax>6.0s) volume: 62mL Mismatch Volume: 30mL CT HEAD No intracranial hemorrhage or CT evidence of large acute infarct. CTA NECK FINDINGS Ascending thoracic aorta measures up to 3.9 cm. Recommend annual imaging followup by CTA or MRA. This recommendation follows 2010 ACCF/AHA/AATS/ACR/ASA/SCA/SCAI/SIR/STS/SVM Guidelines for the Diagnosis and Management of Patients with Thoracic Aortic Disease. Circulation.2010; [id number] Post CABG. One of the jumped grafts is occluded. Mild dilation at the arch/proximal descending thoracic aortic junction with prominent complex plaque with small ULCERATIONS. Plaque and ectasia involving portions both common carotid arteries and both internal carotid arteries. Less than 60% diameter narrowing. Left vertebral artery arises directly from the aortic arch with mild to slightly moderate narrowing proximal aspect. Moderate narrowing proximal right subclavian artery with complex plaque. Moderate narrowing origin of the right vertebral artery. Mild narrowing vertebral arteries at the C1-2 level. CTA HEAD Anterior  circulation without medium or large size vessel significant stenosis or occlusion. Calcified vertebral arteries with mild to slightly moderate narrowing at the level the foramen magnum. No significant stenosis of the basilar artery. Moderate narrowing proximal right posterior cerebral artery. Bilateral posterior cerebral artery distal branch vessel mild narrowing and irregularity. Electronically Signed   By: Genia Del M.D.   On: 08/20/2016 12:34   Dg Chest Portable 1 View  Result Date: 08/20/2016 CLINICAL DATA:  Stroke-like symptoms EXAM: PORTABLE CHEST 1 VIEW COMPARISON:  None. FINDINGS: Cardiac shadow is mildly enlarged. Postsurgical changes are noted. The lungs are well aerated bilaterally without focal infiltrate or sizable effusion. No acute bony abnormality is noted. IMPRESSION: No acute abnormality seen. Electronically Signed   By: Inez Catalina M.D.   On: 08/20/2016 12:08    Procedures Procedures (including critical care time)  Medications Ordered in ED Medications  iopamidol (ISOVUE-370) 76 % injection (not administered)   stroke: mapping our early stages of recovery book (not administered)  0.9 %  sodium chloride infusion (not administered)  acetaminophen (TYLENOL) tablet 650 mg (not administered)    Or  acetaminophen (TYLENOL) solution 650 mg (not administered)    Or  acetaminophen (TYLENOL) suppository 650 mg (not administered)  senna-docusate (Senokot-S) tablet 1 tablet (not administered)  heparin injection 5,000 Units (not administered)  hydrALAZINE (APRESOLINE) injection 5-10 mg (not administered)  albuterol (PROVENTIL HFA;VENTOLIN HFA) 108 (90 Base) MCG/ACT inhaler 1-2 puff (not administered)  aspirin tablet 325 mg (not administered)  lisinopril (PRINIVIL,ZESTRIL) tablet 40 mg (not administered)  levothyroxine (SYNTHROID, LEVOTHROID) tablet 88 mcg (not administered)  metoprolol succinate (TOPROL-XL) 24 hr tablet 50 mg (not administered)  simvastatin (ZOCOR) tablet 40 mg  (not administered)  famotidine (PEPCID) tablet 20 mg (not administered)  iopamidol (ISOVUE-370) 76 % injection (100 mLs  Contrast Given 08/20/16 1126)     Initial Impression / Assessment and Plan / ED Course  I have reviewed the triage vital signs and the nursing notes.  Pertinent labs & imaging results that were available during my care of the patient were reviewed by me and considered in my medical decision making (  see chart for details).  Clinical Course     83 year old female presents today with a aphasia and right-sided weakness. On exam the patient has significant speech deficits with right lower worse than right upper extremity weakness. She is outside the TPA window given her last seen normal was last night. CT head was obtained that did not show any acute findings. Discussed with neurology who recommends CT angiogram and perfusion studies to evaluate whether or not patient is an embolectomy candidate.  Reviewed CT angiogram results, no obvious clots that could be retrieved. At this point neurology recommends inpatient stroke workup including MR studies. Discussed with medicine and they're agreeable to admit the patient. She'll go and its inpatient as she will likely need rehabilitation. Patient is stable at the time of admission.  Final Clinical Impressions(s) / ED Diagnoses   Final diagnoses:  Slurred speech  Right sided weakness    New Prescriptions New Prescriptions   No medications on file     Theodosia Quay, MD 08/20/16 [place], MD 08/21/16 2253

## 2016-08-20 NOTE — Consult Note (Signed)
NEURO HOSPITALIST CONSULT NOTE   Requestig physician: Dr. Marily Memos   Reason for Consult: dysarthria and R sided weakness   History obtained from:  Patient   And family  HPI:                                                                                                                                          Yvonne Parrish is an 83 yo female with hx of HTN and CHF presents for wake up stroke an symptoms of difficulty speaking and R sided weakness. Last known normal yesterday evening.  Past Medical History:  Diagnosis Date  . Hypertension     Past Surgical History:  Procedure Laterality Date  . CORONARY ARTERY BYPASS GRAFT  1999  . KNEE ARTHROSCOPY      No family history on file.    Social History:  reports that she has never smoked. She has never used smokeless tobacco. She reports that she does not drink alcohol or use drugs.  Not on File  MEDICATIONS:                                                                                                                     I have reviewed the patient's current medications.   ROS:                                                                                                                                       History obtained from chart review and the patient  General ROS: negative for - chills, fatigue, fever, night sweats, weight gain or weight loss Psychological ROS: negative for - behavioral disorder, hallucinations, memory difficulties, mood swings or suicidal ideation  Ophthalmic ROS: negative for - blurry vision, double vision, eye pain or loss of vision ENT ROS: negative for - epistaxis, nasal discharge, oral lesions, sore throat, tinnitus or vertigo Allergy and Immunology ROS: negative for - hives or itchy/watery eyes Hematological and Lymphatic ROS: negative for - bleeding problems, bruising or swollen lymph nodes Endocrine ROS: negative for - galactorrhea, hair pattern changes,  polydipsia/polyuria or temperature intolerance Respiratory ROS: negative for - cough, hemoptysis, shortness of breath or wheezing Cardiovascular ROS: negative for - chest pain, dyspnea on exertion, edema or irregular heartbeat Gastrointestinal ROS: negative for - abdominal pain, diarrhea, hematemesis, nausea/vomiting or stool incontinence Genito-Urinary ROS: negative for - dysuria, hematuria, incontinence or urinary frequency/urgency Musculoskeletal ROS: negative for - joint swelling or muscular weakness Neurological ROS: as noted in HPI Dermatological ROS: negative for rash and skin lesion changes   Blood pressure 159/62, pulse 70, temperature 99.1 F (37.3 C), temperature source Oral, resp. rate 18, height 5' 1.5" (1.562 m), weight 64.9 kg (143 lb), SpO2 96 %.   Neurologic Examination:                                                                                                      HEENT-  Normocephalic, no lesions, without obvious abnormality.  Normal external eye and conjunctiva.  Normal TM's bilaterally.  Normal auditory canals and external ears. Normal external nose, mucus membranes and septum.  Normal pharynx. Cardiovascular- regular rate and rhythm, S1, S2 normal, no murmur, click, rub or gallop, pulses palpable throughout   Lungs- chest clear, no wheezing, rales, normal symmetric air entry, Heart exam - S1, S2 normal, no murmur, no gallop, rate regular Abdomen- soft, non-tender; bowel sounds normal; no masses,  no organomegaly   Neurological Examination Mental Status: Alert, oriented, thought content appropriate.  Follows all commands. Mild dysarthria with some word finding difficulty/expressive aphasia?  Cranial Nerves: II: Discs flat bilaterally; Visual fields grossly normal, pupils equal, round, reactive to light and accommodation III,IV, VI: ptosis not present, extra-ocular motions intact bilaterally V,VII: R facial droop VIII: hearing normal bilaterally IX,X: uvula  rises symmetrically XI: bilateral shoulder shrug XII: midline tongue extension Motor: Right : Upper extremity   3/5    Left:     Upper extremity   5/5  Lower extremity   2/5     Lower extremity   5/5 Tone and bulk:normal tone throughout; no atrophy noted Sensory: Pinprick and light touch intact throughout, bilaterally        Lab Results: Basic Metabolic Panel:  Recent Labs Lab 08/20/16 1023  NA 135  K 3.8  CL 100*  CO2 26  GLUCOSE 120*  BUN 8  CREATININE 0.90  CALCIUM 9.4    Liver Function Tests:  Recent Labs Lab 08/20/16 1023  AST 22  ALT 12*  ALKPHOS 77  BILITOT 0.6  PROT 7.4  ALBUMIN 3.8   No results for input(s): LIPASE, AMYLASE in the last 168 hours. No results for input(s): AMMONIA in the last 168 hours.  CBC:  Recent Labs Lab 08/20/16 1023  WBC 11.2*  NEUTROABS 8.0*  HGB 12.3  HCT 36.1  MCV 96.5  PLT 209    Cardiac Enzymes: No results for input(s): CKTOTAL, CKMB, CKMBINDEX, TROPONINI in the last 168 hours.  Lipid Panel: No results for input(s): CHOL, TRIG, HDL, CHOLHDL, VLDL, LDLCALC in the last 168 hours.  CBG: No results for input(s): GLUCAP in the last 168 hours.  Microbiology: No results found for this or any previous visit.  Coagulation Studies:  Recent Labs  08/20/16 1023  LABPROT 13.4  INR 1.02    Imaging: Ct Head Wo Contrast  Result Date: 08/20/2016 CLINICAL DATA:  Right-sided weakness, slurred speech. EXAM: CT HEAD WITHOUT CONTRAST TECHNIQUE: Contiguous axial images were obtained from the base of the skull through the vertex without intravenous contrast. COMPARISON:  None. FINDINGS: Brain: Minimal chronic ischemic white matter disease is noted. No mass effect or midline shift is noted. Ventricular size is within normal limits. There is no evidence of mass lesion, hemorrhage or acute infarction. Vascular: Atherosclerosis of carotid siphons is noted. Skull: Normal. Negative for fracture or focal lesion. Sinuses/Orbits: No  acute finding. Other: None. IMPRESSION: Minimal chronic ischemic white matter disease. No acute intracranial abnormality seen. Electronically Signed   By: Marijo Conception, M.D.   On: 08/20/2016 10:55        Assessment/Plan:  83 yo female with hx of HTN and CHF presents for wake up stroke an symptoms of difficulty speaking and R sided weakness. Last known normal yesterday evening.  - CTH negative for bleed  - Not TPA candidate due to wake up stroke  - STAT CTA and CTP for possible intervention in case of large vessel occlusion. If negative proceed as below  1. HgbA1c, fasting lipid panel 2. MRI, MRA  of the brain without contrast 3. PT consult, OT consult, Speech consult 4. Echocardiogram 5. Carotid dopplers 6. Prophylactic therapy-Antiplatelet med: Aspirin - dose 325mg  7. Risk factor modification 8. Telemetry monitoring 9. Frequent neuro checks 10 NPO until passes stroke swallow screen 11 please page stroke NP  Or  PA  Or MD from 8am -4 pm  as this patient from this time will be  followed by the stroke.   You can look them up on [email or web address]  Password [email or web address]

## 2016-08-20 NOTE — ED Notes (Signed)
Patient physically moved from Trauma B to D34; patient placed on monitor, continuous pulse oximetry and blood pressure cuff; visitors at bedside

## 2016-08-20 NOTE — Progress Notes (Signed)
83 yo female with hx of HTN and CHF presents via [facility] for wake up stroke and symptoms of difficulty speaking and R sided weakness. Pt went to sleep last night around midnight in her normal state of health. She states that around 0100 she woke up to go to the bathroom and was unable to get out of bed d/t RLE weakness. Upon arrival to [facility] ED, the pt was taken for a head CT which showed no acute intracranial abnormality. CTA and CTP were also obtained. tPA not given d/t outside of the window. IR considered but ruled out d/t NIHSS of 5 and lack of evidence of an occlusion per CTA and CTP. Upon assessment at [facility], mild dysarthria with some word finding difficulty/expressive aphasia was appreciated. The patient also had some drift in her RLE with difficulty moving it against gravity and some mild right lower facial droop. NIHSS 5. See EMR for NIHSS details. Bedside handoff with ED RN Santiago Glad.

## 2016-08-20 NOTE — ED Notes (Signed)
Hooked patient back up to the monitor patient is resting 

## 2016-08-20 NOTE — H&P (Signed)
History and Physical    Yvonne Parrish [id number] DOB: [birth year] DOA: 08/20/2016   PCP: Tracie Harrier, MD   Patient coming from:  Home    Chief Complaint: Right sided weakness   HPI: Yvonne Parrish is a 83 y.o. female with medical history significant for HTN,CAD s/p CABG in 1995,with stents, HLD,  Systolic CHF presenting with right sided weakness and dysphasia. Patient  reports waking up with this symptoms, preceded by R eye pain 30 minutes prior to attempting to getting up from bed. Patient reports having some TIA symptoms about 6 months ago, but was quickly resolved and did not seek medical attention, Last known normal yesterday evening.  Denies vertigo dizziness or vision changes. Denies headaches, or dysphagia. No confusion or seizures. Denies any chest pain, or shortness of breath. Denies any fever or chills, or night sweats. No tobacco. No new meds or hormonal supplements. Does take a regular ASA a day, with no other antiplatelets or anticoagulants. Denies any recent long distance trips or recent surgeries. No sick contacts. No new stressors present in personal life Patient is compliant with his medications. .Patient is very sedentary, ambulates with a walker prior to this event.  She is not a diabetic. Family history of stroke in her mother Patient was not administered TPA as is beyond time window for treatment consideration. Will admit for further evaluation and treatment. No tobacco and ETOH or recreational drugs.    ED Course:  BP (!) 128/41   Pulse 77   Temp 99.1 F (37.3 C) (Oral)   Resp 13   Ht 5' 1.5" (1.562 m)   Wt 64.9 kg (143 lb)   SpO2 97%   BMI 26.58 kg/m  Tmax 99   CT angiography and CTP were also obtained,IR considered but ruled out due to and I H SSO 5, and lack of evidence of an occlusion  CT of the head showed no acute intracranial abnormality EKG showed Sinus or ectopic atrial rhythm Tn negative  WBC 11.2   Glu 120  CXR negative for acute findings    Review of Systems: As per HPI otherwise 10 point review of systems negative.   Past Medical History:  Diagnosis Date  . Blood dyscrasia   . CHF (congestive heart failure) ([place])   . COPD (chronic obstructive pulmonary disease) ([place])   . Heart murmur   . Hypertension   . Hypothyroidism     Past Surgical History:  Procedure Laterality Date  . CORONARY ANGIOPLASTY    . CORONARY ARTERY BYPASS GRAFT  1999  . KNEE ARTHROSCOPY      Social History Social History   Social History  . Marital status: Widowed    Spouse name: N/A  . Number of children: N/A  . Years of education: N/A   Occupational History  . Not on file.   Social History Main Topics  . Smoking status: Never Smoker  . Smokeless tobacco: Never Used  . Alcohol use No  . Drug use: No  . Sexual activity: No   Other Topics Concern  . Not on file   Social History Narrative  . No narrative on file     Not on File  Family History  Problem Relation Age of Onset  . Stroke Mother   . Heart disease Father       Prior to Admission medications   Not on File    Physical Exam:    Vitals:   08/20/16 1400 08/20/16 1415 08/20/16 1427 08/20/16 1430  BP: (!) 147/112 112/80 112/80 (!) 128/41  Pulse: 78 78 78 77  Resp: 21 16 16 13   Temp:      TempSrc:      SpO2: 99% 100% 100% 97%  Weight:      Height:           Constitutional: NAD, calm, comfortable   Vitals:   08/20/16 1400 08/20/16 1415 08/20/16 1427 08/20/16 1430  BP: (!) 147/112 112/80 112/80 (!) 128/41  Pulse: 78 78 78 77  Resp: 21 16 16 13   Temp:      TempSrc:      SpO2: 99% 100% 100% 97%  Weight:      Height:       Eyes: PERRL, lids and conjunctivae normal ENMT: Mucous membranes are moist. Posterior pharynx clear of any exudate or lesions.Normal dentition.  Neck: normal, supple, no masses, no thyromegaly Respiratory: clear to auscultation bilaterally, no wheezing, no crackles. Normal respiratory effort. No accessory muscle use.   Cardiovascular: Regular rate and rhythm, 2/6 harsh  murmurs / rubs / gallops. No extremity edema. 2+ pedal pulses. No carotid bruits.  Abdomen: no tenderness, no masses palpated. No hepatosplenomegaly. Bowel sounds positive.  Musculoskeletal: no clubbing / cyanosis. No joint deformity upper and lower extremities. Good ROM, no contractures. Normal muscle tone.  Skin: no rashes, lesions, ulcers.  Neurologic: dysarthria, aphasia on the Right, right leg 2/5, RUE 3/5, left side normal. Sensation normal      Labs on Admission: I have personally reviewed following labs and imaging studies  CBC:  Recent Labs Lab 08/20/16 1023  WBC 11.2*  NEUTROABS 8.0*  HGB 12.3  HCT 36.1  MCV 96.5  PLT [id number]    Basic Metabolic Panel:  Recent Labs Lab 08/20/16 1023  NA 135  K 3.8  CL 100*  CO2 26  GLUCOSE 120*  BUN 8  CREATININE 0.90  CALCIUM 9.4    GFR: Estimated Creatinine Clearance: 40 mL/min (by C-G formula based on SCr of 0.9 mg/dL).  Liver Function Tests:  Recent Labs Lab 08/20/16 1023  AST 22  ALT 12*  ALKPHOS 77  BILITOT 0.6  PROT 7.4  ALBUMIN 3.8   No results for input(s): LIPASE, AMYLASE in the last 168 hours. No results for input(s): AMMONIA in the last 168 hours.  Coagulation Profile:  Recent Labs Lab 08/20/16 1023  INR 1.02    Cardiac Enzymes: No results for input(s): CKTOTAL, CKMB, CKMBINDEX, TROPONINI in the last 168 hours.  BNP (last 3 results) No results for input(s): PROBNP in the last 8760 hours.  HbA1C: No results for input(s): HGBA1C in the last 72 hours.  CBG: No results for input(s): GLUCAP in the last 168 hours.  Lipid Profile: No results for input(s): CHOL, HDL, LDLCALC, TRIG, CHOLHDL, LDLDIRECT in the last 72 hours.  Thyroid Function Tests: No results for input(s): TSH, T4TOTAL, FREET4, T3FREE, THYROIDAB in the last 72 hours.  Anemia Panel: No results for input(s): VITAMINB12, FOLATE, FERRITIN, TIBC, IRON, RETICCTPCT in the last 72  hours.  Urine analysis: No results found for: COLORURINE, APPEARANCEUR, LABSPEC, PHURINE, GLUCOSEU, HGBUR, BILIRUBINUR, KETONESUR, PROTEINUR, UROBILINOGEN, NITRITE, LEUKOCYTESUR  Sepsis Labs: @LABRCNTIP (procalcitonin:4,lacticidven:4) )No results found for this or any previous visit (from the past 240 hour(s)).   Radiological Exams on Admission: Ct Angio Head W Or Wo Contrast  Result Date: 08/20/2016 CLINICAL DATA:  83 year old hypertensive female with right-sided facial droop and right leg weakness. Last seen normal at midnight. Subsequent encounter. EXAM: CT ANGIOGRAPHY HEAD AND NECK.  CT PERFUSION BRAIN TECHNIQUE: Multiphase CT imaging of the brain was performed following IV bolus contrast injection. Subsequent parametric perfusion maps were calculated using RAPID software. Multidetector CT imaging of the head and neck was performed using the standard protocol during bolus administration of intravenous contrast. Multiplanar CT image reconstructions and MIPs were obtained to evaluate the vascular anatomy. Carotid stenosis measurements (when applicable) are obtained utilizing NASCET criteria, using the distal internal carotid diameter as the denominator. CONTRAST:  100 cc Isovue 370. COMPARISON:  08/20/2016 head CT. FINDINGS: CT Brain Perfusion Findings: CBF (<30%) Volume: 81mL Perfusion (Tmax>6.0s) volume: 29mL Mismatch Volume: 84mL Infarction Location:Not identified CT HEAD FINDINGS Brain: No intracranial hemorrhage or CT evidence of large acute infarct. No intracranial mass or abnormal enhancement. No hydrocephalus. Vascular: As below. Skull: Negative. Sinuses: Minimal ethmoid sinus air cell mucosal thickening. Orbits: Post lens replacement without acute abnormality. Review of the MIP images confirms the above findings CTA NECK FINDINGS Aortic arch: 4 vessel arch with left vertebral artery arising directly from the arch. Mild to slightly moderate narrowing origin of the left vertebral artery and left  subclavian artery. Ascending thoracic aorta measures up to 3.9 cm. Post CABG. One of the jumped grafts is occluded. Mild dilation at the arch/proximal descending thoracic aortic junction with prominent complex plaque in small ulcerations. Right carotid system: Plaque and ectasia involving portions of the right common carotid artery and internal carotid artery. Less than 60% diameter narrowing. Left carotid system: Plaque and ectasia involving portions of the left common carotid artery and internal carotid artery. Less than 60% diameter narrowing. Vertebral arteries: Left vertebral artery arises directly from the aortic arch with mild slightly moderate narrowing proximal aspect. Moderate narrowing proximal right subclavian artery with complex plaque. Moderate narrowing origin of the right vertebral artery. Mild narrowing vertebral arteries at the C1-2 level. Skeleton: Cervical spondylotic changes most notable C6-7 followed by the C3-4 level. Remote Schmorl's node deformity and 20% loss of height superior endplate T5. Left lobe of thyroid gland extends slightly inferiorly but without worrisome mass. Mild symmetric prominence lingual tonsillar tissue with pulled secretions vallecula. Right carotid artery impresses upon the posterior aspect of the pharynx. Upper chest: Scattered small calcified granulomas. Review of the MIP images confirms the above findings CTA HEAD FINDINGS Anterior circulation: Anterior circulation without medium or large size vessel significant stenosis or occlusion. Cavernous segment internal carotid artery calcification ectasia with mild narrowing and dilation without high-grade stenosis. Posterior circulation: Calcified vertebral arteries with mild slightly moderate narrowing at the level the foramen magnum. No significant stenosis of the basilar artery. Moderate narrowing proximal right posterior cerebral artery. Bilateral posterior cerebral artery distal branch vessel mild narrowing and  irregularity. Venous sinuses: Patent. Anatomic variants: Fetal contribution to the right posterior cerebral artery. Delayed phase: As above. Review of the MIP images confirms the above findings IMPRESSION: CT Brain Perfusion CBF (<30%) Volume: 54mL Perfusion (Tmax>6.0s) volume: 70mL Mismatch Volume: 71mL CT HEAD No intracranial hemorrhage or CT evidence of large acute infarct. CTA NECK FINDINGS Ascending thoracic aorta measures up to 3.9 cm. Recommend annual imaging followup by CTA or MRA. This recommendation follows 2010 ACCF/AHA/AATS/ACR/ASA/SCA/SCAI/SIR/STS/SVM Guidelines for the Diagnosis and Management of Patients with Thoracic Aortic Disease. Circulation.2010; [id number] Post CABG. One of the jumped grafts is occluded. Mild dilation at the arch/proximal descending thoracic aortic junction with prominent complex plaque with small ULCERATIONS. Plaque and ectasia involving portions both common carotid arteries and both internal carotid arteries. Less than 60% diameter narrowing. Left vertebral artery arises  directly from the aortic arch with mild to slightly moderate narrowing proximal aspect. Moderate narrowing proximal right subclavian artery with complex plaque. Moderate narrowing origin of the right vertebral artery. Mild narrowing vertebral arteries at the C1-2 level. CTA HEAD Anterior circulation without medium or large size vessel significant stenosis or occlusion. Calcified vertebral arteries with mild to slightly moderate narrowing at the level the foramen magnum. No significant stenosis of the basilar artery. Moderate narrowing proximal right posterior cerebral artery. Bilateral posterior cerebral artery distal branch vessel mild narrowing and irregularity. Electronically Signed   By: Genia Del M.D.   On: 08/20/2016 12:34   Ct Head Wo Contrast  Result Date: 08/20/2016 CLINICAL DATA:  Right-sided weakness, slurred speech. EXAM: CT HEAD WITHOUT CONTRAST TECHNIQUE: Contiguous axial images were  obtained from the base of the skull through the vertex without intravenous contrast. COMPARISON:  None. FINDINGS: Brain: Minimal chronic ischemic white matter disease is noted. No mass effect or midline shift is noted. Ventricular size is within normal limits. There is no evidence of mass lesion, hemorrhage or acute infarction. Vascular: Atherosclerosis of carotid siphons is noted. Skull: Normal. Negative for fracture or focal lesion. Sinuses/Orbits: No acute finding. Other: None. IMPRESSION: Minimal chronic ischemic white matter disease. No acute intracranial abnormality seen. Electronically Signed   By: Marijo Conception, M.D.   On: 08/20/2016 10:55   Ct Angio Neck W And/or Wo Contrast  Result Date: 08/20/2016 CLINICAL DATA:  83 year old hypertensive female with right-sided facial droop and right leg weakness. Last seen normal at midnight. Subsequent encounter. EXAM: CT ANGIOGRAPHY HEAD AND NECK. CT PERFUSION BRAIN TECHNIQUE: Multiphase CT imaging of the brain was performed following IV bolus contrast injection. Subsequent parametric perfusion maps were calculated using RAPID software. Multidetector CT imaging of the head and neck was performed using the standard protocol during bolus administration of intravenous contrast. Multiplanar CT image reconstructions and MIPs were obtained to evaluate the vascular anatomy. Carotid stenosis measurements (when applicable) are obtained utilizing NASCET criteria, using the distal internal carotid diameter as the denominator. CONTRAST:  100 cc Isovue 370. COMPARISON:  08/20/2016 head CT. FINDINGS: CT Brain Perfusion Findings: CBF (<30%) Volume: 30mL Perfusion (Tmax>6.0s) volume: 9mL Mismatch Volume: 8mL Infarction Location:Not identified CT HEAD FINDINGS Brain: No intracranial hemorrhage or CT evidence of large acute infarct. No intracranial mass or abnormal enhancement. No hydrocephalus. Vascular: As below. Skull: Negative. Sinuses: Minimal ethmoid sinus air cell mucosal  thickening. Orbits: Post lens replacement without acute abnormality. Review of the MIP images confirms the above findings CTA NECK FINDINGS Aortic arch: 4 vessel arch with left vertebral artery arising directly from the arch. Mild to slightly moderate narrowing origin of the left vertebral artery and left subclavian artery. Ascending thoracic aorta measures up to 3.9 cm. Post CABG. One of the jumped grafts is occluded. Mild dilation at the arch/proximal descending thoracic aortic junction with prominent complex plaque in small ulcerations. Right carotid system: Plaque and ectasia involving portions of the right common carotid artery and internal carotid artery. Less than 60% diameter narrowing. Left carotid system: Plaque and ectasia involving portions of the left common carotid artery and internal carotid artery. Less than 60% diameter narrowing. Vertebral arteries: Left vertebral artery arises directly from the aortic arch with mild slightly moderate narrowing proximal aspect. Moderate narrowing proximal right subclavian artery with complex plaque. Moderate narrowing origin of the right vertebral artery. Mild narrowing vertebral arteries at the C1-2 level. Skeleton: Cervical spondylotic changes most notable C6-7 followed by the C3-4 level. Remote  Schmorl's node deformity and 20% loss of height superior endplate T5. Left lobe of thyroid gland extends slightly inferiorly but without worrisome mass. Mild symmetric prominence lingual tonsillar tissue with pulled secretions vallecula. Right carotid artery impresses upon the posterior aspect of the pharynx. Upper chest: Scattered small calcified granulomas. Review of the MIP images confirms the above findings CTA HEAD FINDINGS Anterior circulation: Anterior circulation without medium or large size vessel significant stenosis or occlusion. Cavernous segment internal carotid artery calcification ectasia with mild narrowing and dilation without high-grade stenosis.  Posterior circulation: Calcified vertebral arteries with mild slightly moderate narrowing at the level the foramen magnum. No significant stenosis of the basilar artery. Moderate narrowing proximal right posterior cerebral artery. Bilateral posterior cerebral artery distal branch vessel mild narrowing and irregularity. Venous sinuses: Patent. Anatomic variants: Fetal contribution to the right posterior cerebral artery. Delayed phase: As above. Review of the MIP images confirms the above findings IMPRESSION: CT Brain Perfusion CBF (<30%) Volume: 52mL Perfusion (Tmax>6.0s) volume: 34mL Mismatch Volume: 73mL CT HEAD No intracranial hemorrhage or CT evidence of large acute infarct. CTA NECK FINDINGS Ascending thoracic aorta measures up to 3.9 cm. Recommend annual imaging followup by CTA or MRA. This recommendation follows 2010 ACCF/AHA/AATS/ACR/ASA/SCA/SCAI/SIR/STS/SVM Guidelines for the Diagnosis and Management of Patients with Thoracic Aortic Disease. Circulation.2010; [id number] Post CABG. One of the jumped grafts is occluded. Mild dilation at the arch/proximal descending thoracic aortic junction with prominent complex plaque with small ULCERATIONS. Plaque and ectasia involving portions both common carotid arteries and both internal carotid arteries. Less than 60% diameter narrowing. Left vertebral artery arises directly from the aortic arch with mild to slightly moderate narrowing proximal aspect. Moderate narrowing proximal right subclavian artery with complex plaque. Moderate narrowing origin of the right vertebral artery. Mild narrowing vertebral arteries at the C1-2 level. CTA HEAD Anterior circulation without medium or large size vessel significant stenosis or occlusion. Calcified vertebral arteries with mild to slightly moderate narrowing at the level the foramen magnum. No significant stenosis of the basilar artery. Moderate narrowing proximal right posterior cerebral artery. Bilateral posterior cerebral  artery distal branch vessel mild narrowing and irregularity. Electronically Signed   By: Genia Del M.D.   On: 08/20/2016 12:34   Ct Cerebral Perfusion W Contrast  Result Date: 08/20/2016 CLINICAL DATA:  83 year old hypertensive female with right-sided facial droop and right leg weakness. Last seen normal at midnight. Subsequent encounter. EXAM: CT ANGIOGRAPHY HEAD AND NECK. CT PERFUSION BRAIN TECHNIQUE: Multiphase CT imaging of the brain was performed following IV bolus contrast injection. Subsequent parametric perfusion maps were calculated using RAPID software. Multidetector CT imaging of the head and neck was performed using the standard protocol during bolus administration of intravenous contrast. Multiplanar CT image reconstructions and MIPs were obtained to evaluate the vascular anatomy. Carotid stenosis measurements (when applicable) are obtained utilizing NASCET criteria, using the distal internal carotid diameter as the denominator. CONTRAST:  100 cc Isovue 370. COMPARISON:  08/20/2016 head CT. FINDINGS: CT Brain Perfusion Findings: CBF (<30%) Volume: 39mL Perfusion (Tmax>6.0s) volume: 5mL Mismatch Volume: 61mL Infarction Location:Not identified CT HEAD FINDINGS Brain: No intracranial hemorrhage or CT evidence of large acute infarct. No intracranial mass or abnormal enhancement. No hydrocephalus. Vascular: As below. Skull: Negative. Sinuses: Minimal ethmoid sinus air cell mucosal thickening. Orbits: Post lens replacement without acute abnormality. Review of the MIP images confirms the above findings CTA NECK FINDINGS Aortic arch: 4 vessel arch with left vertebral artery arising directly from the arch. Mild to slightly moderate narrowing origin  of the left vertebral artery and left subclavian artery. Ascending thoracic aorta measures up to 3.9 cm. Post CABG. One of the jumped grafts is occluded. Mild dilation at the arch/proximal descending thoracic aortic junction with prominent complex plaque in small  ulcerations. Right carotid system: Plaque and ectasia involving portions of the right common carotid artery and internal carotid artery. Less than 60% diameter narrowing. Left carotid system: Plaque and ectasia involving portions of the left common carotid artery and internal carotid artery. Less than 60% diameter narrowing. Vertebral arteries: Left vertebral artery arises directly from the aortic arch with mild slightly moderate narrowing proximal aspect. Moderate narrowing proximal right subclavian artery with complex plaque. Moderate narrowing origin of the right vertebral artery. Mild narrowing vertebral arteries at the C1-2 level. Skeleton: Cervical spondylotic changes most notable C6-7 followed by the C3-4 level. Remote Schmorl's node deformity and 20% loss of height superior endplate T5. Left lobe of thyroid gland extends slightly inferiorly but without worrisome mass. Mild symmetric prominence lingual tonsillar tissue with pulled secretions vallecula. Right carotid artery impresses upon the posterior aspect of the pharynx. Upper chest: Scattered small calcified granulomas. Review of the MIP images confirms the above findings CTA HEAD FINDINGS Anterior circulation: Anterior circulation without medium or large size vessel significant stenosis or occlusion. Cavernous segment internal carotid artery calcification ectasia with mild narrowing and dilation without high-grade stenosis. Posterior circulation: Calcified vertebral arteries with mild slightly moderate narrowing at the level the foramen magnum. No significant stenosis of the basilar artery. Moderate narrowing proximal right posterior cerebral artery. Bilateral posterior cerebral artery distal branch vessel mild narrowing and irregularity. Venous sinuses: Patent. Anatomic variants: Fetal contribution to the right posterior cerebral artery. Delayed phase: As above. Review of the MIP images confirms the above findings IMPRESSION: CT Brain Perfusion CBF  (<30%) Volume: 87mL Perfusion (Tmax>6.0s) volume: 68mL Mismatch Volume: 73mL CT HEAD No intracranial hemorrhage or CT evidence of large acute infarct. CTA NECK FINDINGS Ascending thoracic aorta measures up to 3.9 cm. Recommend annual imaging followup by CTA or MRA. This recommendation follows 2010 ACCF/AHA/AATS/ACR/ASA/SCA/SCAI/SIR/STS/SVM Guidelines for the Diagnosis and Management of Patients with Thoracic Aortic Disease. Circulation.2010; [id number] Post CABG. One of the jumped grafts is occluded. Mild dilation at the arch/proximal descending thoracic aortic junction with prominent complex plaque with small ULCERATIONS. Plaque and ectasia involving portions both common carotid arteries and both internal carotid arteries. Less than 60% diameter narrowing. Left vertebral artery arises directly from the aortic arch with mild to slightly moderate narrowing proximal aspect. Moderate narrowing proximal right subclavian artery with complex plaque. Moderate narrowing origin of the right vertebral artery. Mild narrowing vertebral arteries at the C1-2 level. CTA HEAD Anterior circulation without medium or large size vessel significant stenosis or occlusion. Calcified vertebral arteries with mild to slightly moderate narrowing at the level the foramen magnum. No significant stenosis of the basilar artery. Moderate narrowing proximal right posterior cerebral artery. Bilateral posterior cerebral artery distal branch vessel mild narrowing and irregularity. Electronically Signed   By: Genia Del M.D.   On: 08/20/2016 12:34   Dg Chest Portable 1 View  Result Date: 08/20/2016 CLINICAL DATA:  Stroke-like symptoms EXAM: PORTABLE CHEST 1 VIEW COMPARISON:  None. FINDINGS: Cardiac shadow is mildly enlarged. Postsurgical changes are noted. The lungs are well aerated bilaterally without focal infiltrate or sizable effusion. No acute bony abnormality is noted. IMPRESSION: No acute abnormality seen. Electronically Signed   By: Inez Catalina M.D.   On: 08/20/2016 12:08    EKG:  Independently reviewed.  Assessment/Plan Active Problems:   COPD exacerbation (HCC)   Aphasia   Acquired hypothyroidism   Asthma without status asthmaticus   Cardiomyopathy, dilated (HCC)   CHF (congestive heart failure) (HCC)   Chronic back pain   History of coronary artery bypass graft   H/O cardiac catheterization   Hyperlipidemia, unspecified   Hypertension   Myelodysplastic syndrome ([place])     Acute-sided right sided  Weakness with dysarthria and dysphasia,  Suspect Acute CVA Not a TPA candidate as  last known normal was the night prior to hospitalization. CT head negative for acute intracranial abnormalities.CT angiography and CTP were also obtained, IR considered but ruled out due to  lack of evidence of an occlusion . Risk factors include age, HDL, HTN, CAD, CHF , sedentary lifestyle, obesity   Admit to Tele / Inpatient Stroke order set  MRI/MRA brain   MRI/MRA neck Allow permissive HTN  2 DEcho  PT/OT/SLP  lipid panel A1C Aspirin  Neuro  following, awaiting further recommendations pending on the findings  Social Work, likely to need inpatient rehab  Leukocytosis, in the setting of MDS followed at [facility].  WBC 11.2   CXR NAD  Tmax 99    IVF  UA and  BCx   Repeat CBC in AM   Hypertension BP 146/67 Pulse 78   Hold anti hypertensives for now  Including lisinopril and Toprol XL  Add Hydralazine Q6 hours as needed for BP 210/110  Hyperlipidemia Continue home Zocor   CAD, s/p CABG 1995, card stent in early 2000s, valvular disease treated medically EKG showed Sinus or ectopic atrial rhythm. patient is cardiac pain free at this time. Last 2 D echo  Continue ASA, and resume meds  after permissive HTN lifted.   COPD without exacerbation Osats  CXR NAD   WBC  11.2   Continue inhaler  and O2 prn   Hypothyroidism: -Continue home Synthroid     DVT prophylaxis:  Heparin  Code Status:  DNR   Family Communication:   Discussed with patient Disposition Plan: Expect patient to be discharged to home after condition improves Consults called:   Neuro Admission status:  Inpatient     [facility] E, PA-C [facility]   08/20/2016, 2:33 PM

## 2016-08-20 NOTE — ED Notes (Signed)
ED Provider at bedside. 

## 2016-08-21 ENCOUNTER — Inpatient Hospital Stay (HOSPITAL_COMMUNITY): Payer: Medicare Other

## 2016-08-21 DIAGNOSIS — E038 Other specified hypothyroidism: Secondary | ICD-10-CM

## 2016-08-21 DIAGNOSIS — R4789 Other speech disturbances: Secondary | ICD-10-CM

## 2016-08-21 DIAGNOSIS — I509 Heart failure, unspecified: Secondary | ICD-10-CM

## 2016-08-21 DIAGNOSIS — D72829 Elevated white blood cell count, unspecified: Secondary | ICD-10-CM

## 2016-08-21 DIAGNOSIS — I5032 Chronic diastolic (congestive) heart failure: Secondary | ICD-10-CM

## 2016-08-21 DIAGNOSIS — E039 Hypothyroidism, unspecified: Secondary | ICD-10-CM

## 2016-08-21 DIAGNOSIS — R4701 Aphasia: Secondary | ICD-10-CM

## 2016-08-21 DIAGNOSIS — I10 Essential (primary) hypertension: Secondary | ICD-10-CM

## 2016-08-21 DIAGNOSIS — G8191 Hemiplegia, unspecified affecting right dominant side: Secondary | ICD-10-CM

## 2016-08-21 DIAGNOSIS — I6350 Cerebral infarction due to unspecified occlusion or stenosis of unspecified cerebral artery: Secondary | ICD-10-CM

## 2016-08-21 DIAGNOSIS — I638 Other cerebral infarction: Secondary | ICD-10-CM

## 2016-08-21 DIAGNOSIS — I25810 Atherosclerosis of coronary artery bypass graft(s) without angina pectoris: Secondary | ICD-10-CM

## 2016-08-21 DIAGNOSIS — R7309 Other abnormal glucose: Secondary | ICD-10-CM

## 2016-08-21 DIAGNOSIS — R739 Hyperglycemia, unspecified: Secondary | ICD-10-CM

## 2016-08-21 DIAGNOSIS — R4781 Slurred speech: Secondary | ICD-10-CM

## 2016-08-21 DIAGNOSIS — J449 Chronic obstructive pulmonary disease, unspecified: Secondary | ICD-10-CM

## 2016-08-21 DIAGNOSIS — E785 Hyperlipidemia, unspecified: Secondary | ICD-10-CM

## 2016-08-21 LAB — VAS US CAROTID
LEFT ECA DIAS: -20 cm/s
LEFT VERTEBRAL DIAS: -15 cm/s
Left CCA dist dias: 10 cm/s
Left CCA dist sys: 53 cm/s
Left CCA prox dias: 11 cm/s
Left CCA prox sys: 51 cm/s
Left ICA dist dias: -15 cm/s
Left ICA dist sys: -76 cm/s
Left ICA prox dias: 17 cm/s
Left ICA prox sys: 97 cm/s
RIGHT ECA DIAS: -14 cm/s
RIGHT VERTEBRAL DIAS: -13 cm/s
Right CCA prox dias: -11 cm/s
Right CCA prox sys: -60 cm/s
Right cca dist sys: -149 cm/s

## 2016-08-21 LAB — LIPID PANEL
Cholesterol: 168 mg/dL (ref 0–200)
HDL: 57 mg/dL (ref >40)
LDL Cholesterol: 94 mg/dL (ref 0–99)
Total CHOL/HDL Ratio: 2.9 RATIO
Triglycerides: 87 mg/dL (ref <150)
VLDL: 17 mg/dL (ref 0–40)

## 2016-08-21 MED ORDER — ASPIRIN 81 MG PO CHEW
81.0000 mg | CHEWABLE_TABLET | Freq: Every day | ORAL | Status: DC
  Administered 2016-08-22: 81 mg via ORAL
  Filled 2016-08-21: qty 1

## 2016-08-21 MED ORDER — ASPIRIN-DIPYRIDAMOLE ER 25-200 MG PO CP12
1.0000 | ORAL_CAPSULE | Freq: Every day | ORAL | Status: DC
  Administered 2016-08-21: 1 via ORAL
  Filled 2016-08-21: qty 1

## 2016-08-21 MED ORDER — ASPIRIN-DIPYRIDAMOLE ER 25-200 MG PO CP12: 1.0000 | ORAL_CAPSULE | Freq: Two times a day (BID) | ORAL | Status: DC

## 2016-08-21 NOTE — Progress Notes (Signed)
STROKE TEAM PROGRESS NOTE   HISTORY OF PRESENT ILLNESS (per record) Yvonne Parrish is an 83 yo female with hx of HTN and CHF presents for wake up stroke an symptoms of difficulty speaking and R sided weakness. Last known normal yesterday evening. Patient was not administered IV t-PA secondary to delay in arrival. She was admitted for further evaluation and treatment.   SUBJECTIVE (INTERVAL HISTORY) No family is at the bedside.  Overall she feels her condition is stable. She is not a fan of plavix. Agreeable to try Aggrenox.She had neurological worsening after admission yesterday but overnight symptoms have remained stable   OBJECTIVE Temp:  [98.2 F (36.8 C)-99.1 F (37.3 C)] 98.2 F (36.8 C) (01/10 0643) Pulse Rate:  [68-81] 77 (01/10 0643) Cardiac Rhythm: Heart block (01/10 0700) Resp:  [13-22] 16 (01/10 0643) BP: (112-178)/(41-112) 152/72 (01/10 0643) SpO2:  [88 %-100 %] 98 % (01/10 0643) Weight:  [64.9 kg (143 lb)-67.2 kg (148 lb 1.6 oz)] 67.2 kg (148 lb 1.6 oz) (01/09 1841)  CBC:  Recent Labs Lab 08/20/16 1023  WBC 11.2*  NEUTROABS 8.0*  HGB 12.3  HCT 36.1  MCV 96.5  PLT [id number]    Basic Metabolic Panel:  Recent Labs Lab 08/20/16 1023  NA 135  K 3.8  CL 100*  CO2 26  GLUCOSE 120*  BUN 8  CREATININE 0.90  CALCIUM 9.4    Lipid Panel:    Component Value Date/Time   CHOL 168 08/21/2016 0628   TRIG 87 08/21/2016 0628   HDL 57 08/21/2016 0628   CHOLHDL 2.9 08/21/2016 0628   VLDL 17 08/21/2016 0628   LDLCALC 94 08/21/2016 0628   HgbA1c: No results found for: HGBA1C Urine Drug Screen:    Component Value Date/Time   LABOPIA NONE DETECTED 08/20/2016 1422   COCAINSCRNUR NONE DETECTED 08/20/2016 1422   LABBENZ NONE DETECTED 08/20/2016 1422   AMPHETMU NONE DETECTED 08/20/2016 1422   THCU NONE DETECTED 08/20/2016 1422   LABBARB NONE DETECTED 08/20/2016 1422      IMAGING  Ct Angio Head W Or Wo Contrast  Result Date: 08/20/2016 CLINICAL DATA:  83 year old  hypertensive female with right-sided facial droop and right leg weakness. Last seen normal at midnight. Subsequent encounter. EXAM: CT ANGIOGRAPHY HEAD AND NECK. CT PERFUSION BRAIN TECHNIQUE: Multiphase CT imaging of the brain was performed following IV bolus contrast injection. Subsequent parametric perfusion maps were calculated using RAPID software. Multidetector CT imaging of the head and neck was performed using the standard protocol during bolus administration of intravenous contrast. Multiplanar CT image reconstructions and MIPs were obtained to evaluate the vascular anatomy. Carotid stenosis measurements (when applicable) are obtained utilizing NASCET criteria, using the distal internal carotid diameter as the denominator. CONTRAST:  100 cc Isovue 370. COMPARISON:  08/20/2016 head CT. FINDINGS: CT Brain Perfusion Findings: CBF (<30%) Volume: 48mL Perfusion (Tmax>6.0s) volume: 17mL Mismatch Volume: 57mL Infarction Location:Not identified CT HEAD FINDINGS Brain: No intracranial hemorrhage or CT evidence of large acute infarct. No intracranial mass or abnormal enhancement. No hydrocephalus. Vascular: As below. Skull: Negative. Sinuses: Minimal ethmoid sinus air cell mucosal thickening. Orbits: Post lens replacement without acute abnormality. Review of the MIP images confirms the above findings CTA NECK FINDINGS Aortic arch: 4 vessel arch with left vertebral artery arising directly from the arch. Mild to slightly moderate narrowing origin of the left vertebral artery and left subclavian artery. Ascending thoracic aorta measures up to 3.9 cm. Post CABG. One of the jumped grafts is occluded. Mild dilation  at the arch/proximal descending thoracic aortic junction with prominent complex plaque in small ulcerations. Right carotid system: Plaque and ectasia involving portions of the right common carotid artery and internal carotid artery. Less than 60% diameter narrowing. Left carotid system: Plaque and ectasia involving  portions of the left common carotid artery and internal carotid artery. Less than 60% diameter narrowing. Vertebral arteries: Left vertebral artery arises directly from the aortic arch with mild slightly moderate narrowing proximal aspect. Moderate narrowing proximal right subclavian artery with complex plaque. Moderate narrowing origin of the right vertebral artery. Mild narrowing vertebral arteries at the C1-2 level. Skeleton: Cervical spondylotic changes most notable C6-7 followed by the C3-4 level. Remote Schmorl's node deformity and 20% loss of height superior endplate T5. Left lobe of thyroid gland extends slightly inferiorly but without worrisome mass. Mild symmetric prominence lingual tonsillar tissue with pulled secretions vallecula. Right carotid artery impresses upon the posterior aspect of the pharynx. Upper chest: Scattered small calcified granulomas. Review of the MIP images confirms the above findings CTA HEAD FINDINGS Anterior circulation: Anterior circulation without medium or large size vessel significant stenosis or occlusion. Cavernous segment internal carotid artery calcification ectasia with mild narrowing and dilation without high-grade stenosis. Posterior circulation: Calcified vertebral arteries with mild slightly moderate narrowing at the level the foramen magnum. No significant stenosis of the basilar artery. Moderate narrowing proximal right posterior cerebral artery. Bilateral posterior cerebral artery distal branch vessel mild narrowing and irregularity. Venous sinuses: Patent. Anatomic variants: Fetal contribution to the right posterior cerebral artery. Delayed phase: As above. Review of the MIP images confirms the above findings IMPRESSION: CT Brain Perfusion CBF (<30%) Volume: 78mL Perfusion (Tmax>6.0s) volume: 62mL Mismatch Volume: 73mL CT HEAD No intracranial hemorrhage or CT evidence of large acute infarct. CTA NECK FINDINGS Ascending thoracic aorta measures up to 3.9 cm. Recommend  annual imaging followup by CTA or MRA. This recommendation follows 2010 ACCF/AHA/AATS/ACR/ASA/SCA/SCAI/SIR/STS/SVM Guidelines for the Diagnosis and Management of Patients with Thoracic Aortic Disease. Circulation.2010; [id number] Post CABG. One of the jumped grafts is occluded. Mild dilation at the arch/proximal descending thoracic aortic junction with prominent complex plaque with small ULCERATIONS. Plaque and ectasia involving portions both common carotid arteries and both internal carotid arteries. Less than 60% diameter narrowing. Left vertebral artery arises directly from the aortic arch with mild to slightly moderate narrowing proximal aspect. Moderate narrowing proximal right subclavian artery with complex plaque. Moderate narrowing origin of the right vertebral artery. Mild narrowing vertebral arteries at the C1-2 level. CTA HEAD Anterior circulation without medium or large size vessel significant stenosis or occlusion. Calcified vertebral arteries with mild to slightly moderate narrowing at the level the foramen magnum. No significant stenosis of the basilar artery. Moderate narrowing proximal right posterior cerebral artery. Bilateral posterior cerebral artery distal branch vessel mild narrowing and irregularity. Electronically Signed   By: Genia Del M.D.   On: 08/20/2016 12:34   Ct Head Wo Contrast  Result Date: 08/20/2016 CLINICAL DATA:  Right-sided weakness, slurred speech. EXAM: CT HEAD WITHOUT CONTRAST TECHNIQUE: Contiguous axial images were obtained from the base of the skull through the vertex without intravenous contrast. COMPARISON:  None. FINDINGS: Brain: Minimal chronic ischemic white matter disease is noted. No mass effect or midline shift is noted. Ventricular size is within normal limits. There is no evidence of mass lesion, hemorrhage or acute infarction. Vascular: Atherosclerosis of carotid siphons is noted. Skull: Normal. Negative for fracture or focal lesion. Sinuses/Orbits: No  acute finding. Other: None. IMPRESSION: Minimal chronic ischemic  white matter disease. No acute intracranial abnormality seen. Electronically Signed   By: Marijo Conception, M.D.   On: 08/20/2016 10:55   Ct Angio Neck W And/or Wo Contrast  Result Date: 08/20/2016 CLINICAL DATA:  83 year old hypertensive female with right-sided facial droop and right leg weakness. Last seen normal at midnight. Subsequent encounter. EXAM: CT ANGIOGRAPHY HEAD AND NECK. CT PERFUSION BRAIN TECHNIQUE: Multiphase CT imaging of the brain was performed following IV bolus contrast injection. Subsequent parametric perfusion maps were calculated using RAPID software. Multidetector CT imaging of the head and neck was performed using the standard protocol during bolus administration of intravenous contrast. Multiplanar CT image reconstructions and MIPs were obtained to evaluate the vascular anatomy. Carotid stenosis measurements (when applicable) are obtained utilizing NASCET criteria, using the distal internal carotid diameter as the denominator. CONTRAST:  100 cc Isovue 370. COMPARISON:  08/20/2016 head CT. FINDINGS: CT Brain Perfusion Findings: CBF (<30%) Volume: 76mL Perfusion (Tmax>6.0s) volume: 48mL Mismatch Volume: 34mL Infarction Location:Not identified CT HEAD FINDINGS Brain: No intracranial hemorrhage or CT evidence of large acute infarct. No intracranial mass or abnormal enhancement. No hydrocephalus. Vascular: As below. Skull: Negative. Sinuses: Minimal ethmoid sinus air cell mucosal thickening. Orbits: Post lens replacement without acute abnormality. Review of the MIP images confirms the above findings CTA NECK FINDINGS Aortic arch: 4 vessel arch with left vertebral artery arising directly from the arch. Mild to slightly moderate narrowing origin of the left vertebral artery and left subclavian artery. Ascending thoracic aorta measures up to 3.9 cm. Post CABG. One of the jumped grafts is occluded. Mild dilation at the arch/proximal  descending thoracic aortic junction with prominent complex plaque in small ulcerations. Right carotid system: Plaque and ectasia involving portions of the right common carotid artery and internal carotid artery. Less than 60% diameter narrowing. Left carotid system: Plaque and ectasia involving portions of the left common carotid artery and internal carotid artery. Less than 60% diameter narrowing. Vertebral arteries: Left vertebral artery arises directly from the aortic arch with mild slightly moderate narrowing proximal aspect. Moderate narrowing proximal right subclavian artery with complex plaque. Moderate narrowing origin of the right vertebral artery. Mild narrowing vertebral arteries at the C1-2 level. Skeleton: Cervical spondylotic changes most notable C6-7 followed by the C3-4 level. Remote Schmorl's node deformity and 20% loss of height superior endplate T5. Left lobe of thyroid gland extends slightly inferiorly but without worrisome mass. Mild symmetric prominence lingual tonsillar tissue with pulled secretions vallecula. Right carotid artery impresses upon the posterior aspect of the pharynx. Upper chest: Scattered small calcified granulomas. Review of the MIP images confirms the above findings CTA HEAD FINDINGS Anterior circulation: Anterior circulation without medium or large size vessel significant stenosis or occlusion. Cavernous segment internal carotid artery calcification ectasia with mild narrowing and dilation without high-grade stenosis. Posterior circulation: Calcified vertebral arteries with mild slightly moderate narrowing at the level the foramen magnum. No significant stenosis of the basilar artery. Moderate narrowing proximal right posterior cerebral artery. Bilateral posterior cerebral artery distal branch vessel mild narrowing and irregularity. Venous sinuses: Patent. Anatomic variants: Fetal contribution to the right posterior cerebral artery. Delayed phase: As above. Review of the MIP  images confirms the above findings IMPRESSION: CT Brain Perfusion CBF (<30%) Volume: 25mL Perfusion (Tmax>6.0s) volume: 82mL Mismatch Volume: 36mL CT HEAD No intracranial hemorrhage or CT evidence of large acute infarct. CTA NECK FINDINGS Ascending thoracic aorta measures up to 3.9 cm. Recommend annual imaging followup by CTA or MRA. This recommendation follows 2010 ACCF/AHA/AATS/ACR/ASA/SCA/SCAI/SIR/STS/SVM  Guidelines for the Diagnosis and Management of Patients with Thoracic Aortic Disease. Circulation.2010; [id number] Post CABG. One of the jumped grafts is occluded. Mild dilation at the arch/proximal descending thoracic aortic junction with prominent complex plaque with small ULCERATIONS. Plaque and ectasia involving portions both common carotid arteries and both internal carotid arteries. Less than 60% diameter narrowing. Left vertebral artery arises directly from the aortic arch with mild to slightly moderate narrowing proximal aspect. Moderate narrowing proximal right subclavian artery with complex plaque. Moderate narrowing origin of the right vertebral artery. Mild narrowing vertebral arteries at the C1-2 level. CTA HEAD Anterior circulation without medium or large size vessel significant stenosis or occlusion. Calcified vertebral arteries with mild to slightly moderate narrowing at the level the foramen magnum. No significant stenosis of the basilar artery. Moderate narrowing proximal right posterior cerebral artery. Bilateral posterior cerebral artery distal branch vessel mild narrowing and irregularity. Electronically Signed   By: Genia Del M.D.   On: 08/20/2016 12:34   Mr Brain Wo Contrast  Result Date: 08/20/2016 EXAM: MRI HEAD WITHOUT CONTRAST MRA HEAD WITHOUT CONTRAST TECHNIQUE: Multiplanar, multiecho pulse sequences of the brain and surrounding structures were obtained without intravenous contrast. Angiographic images of the head were obtained using MRA technique without contrast. COMPARISON:   CT 08/20/2016. FINDINGS: MRI HEAD FINDINGS Brain: Acute nonhemorrhagic left paracentral pontine infarct. Remote tiny left caudate head infarct. Remote tiny right cerebellar infarct. Mild chronic microvascular changes. Mild global atrophy without hydrocephalus. Prominent choroid cysts incidentally noted. No intracranial mass lesion noted on this unenhanced exam. Vascular: As below. Skull and upper cervical spine: C3-4 protrusion with moderate spinal stenosis with mild to moderate cord flattening. Sinuses/Orbits: Post lens replacement without acute abnormality. Visualized sinuses are clear. Other: Negative MRA HEAD FINDINGS Mild to slightly moderate narrowing distal vertebral arteries bilaterally. Mild narrowing mid basilar artery. Only proximal aspect of the right posterior inferior cerebellar artery is well delineated with narrowed vessel beyond this region. Mild to moderate narrowing proximal left posterior inferior cerebellar artery. Nonvisualized anterior inferior cerebellar artery bilaterally. Marked narrowing proximal right posterior cerebral artery with moderate to marked narrowing distal right posterior cerebral artery branches. Moderate tandem stenosis mid aspect left posterior cerebral artery. Mild to moderate tandem stenosis right superior cerebellar artery. Moderate narrowing distal M1 segment left middle cerebral artery. Ectatic slightly irregular cavernous segment internal carotid artery bilaterally without significant stenosis. Fetal contribution to the right posterior cerebral artery. IMPRESSION: MRI HEAD Acute nonhemorrhagic left paracentral pontine infarct. Remote tiny left caudate head infarct. Remote tiny right cerebellar infarct. Mild chronic microvascular changes. C3-4 protrusion with moderate spinal stenosis with mild to moderate cord flattening. MRA HEAD Intracranial atherosclerotic changes more notable involving posterior circulation as detailed above. These results were called by telephone  at the time of interpretation on 08/20/2016 at 4:24 pm to Dr. Sharene Butters , who verbally acknowledged these results. Electronically Signed   By: Genia Del M.D.   On: 08/20/2016 17:08   Ct Cerebral Perfusion W Contrast  Result Date: 08/20/2016 CLINICAL DATA:  83 year old hypertensive female with right-sided facial droop and right leg weakness. Last seen normal at midnight. Subsequent encounter. EXAM: CT ANGIOGRAPHY HEAD AND NECK. CT PERFUSION BRAIN TECHNIQUE: Multiphase CT imaging of the brain was performed following IV bolus contrast injection. Subsequent parametric perfusion maps were calculated using RAPID software. Multidetector CT imaging of the head and neck was performed using the standard protocol during bolus administration of intravenous contrast. Multiplanar CT image reconstructions and MIPs were obtained to evaluate  the vascular anatomy. Carotid stenosis measurements (when applicable) are obtained utilizing NASCET criteria, using the distal internal carotid diameter as the denominator. CONTRAST:  100 cc Isovue 370. COMPARISON:  08/20/2016 head CT. FINDINGS: CT Brain Perfusion Findings: CBF (<30%) Volume: 69mL Perfusion (Tmax>6.0s) volume: 44mL Mismatch Volume: 56mL Infarction Location:Not identified CT HEAD FINDINGS Brain: No intracranial hemorrhage or CT evidence of large acute infarct. No intracranial mass or abnormal enhancement. No hydrocephalus. Vascular: As below. Skull: Negative. Sinuses: Minimal ethmoid sinus air cell mucosal thickening. Orbits: Post lens replacement without acute abnormality. Review of the MIP images confirms the above findings CTA NECK FINDINGS Aortic arch: 4 vessel arch with left vertebral artery arising directly from the arch. Mild to slightly moderate narrowing origin of the left vertebral artery and left subclavian artery. Ascending thoracic aorta measures up to 3.9 cm. Post CABG. One of the jumped grafts is occluded. Mild dilation at the arch/proximal descending thoracic  aortic junction with prominent complex plaque in small ulcerations. Right carotid system: Plaque and ectasia involving portions of the right common carotid artery and internal carotid artery. Less than 60% diameter narrowing. Left carotid system: Plaque and ectasia involving portions of the left common carotid artery and internal carotid artery. Less than 60% diameter narrowing. Vertebral arteries: Left vertebral artery arises directly from the aortic arch with mild slightly moderate narrowing proximal aspect. Moderate narrowing proximal right subclavian artery with complex plaque. Moderate narrowing origin of the right vertebral artery. Mild narrowing vertebral arteries at the C1-2 level. Skeleton: Cervical spondylotic changes most notable C6-7 followed by the C3-4 level. Remote Schmorl's node deformity and 20% loss of height superior endplate T5. Left lobe of thyroid gland extends slightly inferiorly but without worrisome mass. Mild symmetric prominence lingual tonsillar tissue with pulled secretions vallecula. Right carotid artery impresses upon the posterior aspect of the pharynx. Upper chest: Scattered small calcified granulomas. Review of the MIP images confirms the above findings CTA HEAD FINDINGS Anterior circulation: Anterior circulation without medium or large size vessel significant stenosis or occlusion. Cavernous segment internal carotid artery calcification ectasia with mild narrowing and dilation without high-grade stenosis. Posterior circulation: Calcified vertebral arteries with mild slightly moderate narrowing at the level the foramen magnum. No significant stenosis of the basilar artery. Moderate narrowing proximal right posterior cerebral artery. Bilateral posterior cerebral artery distal branch vessel mild narrowing and irregularity. Venous sinuses: Patent. Anatomic variants: Fetal contribution to the right posterior cerebral artery. Delayed phase: As above. Review of the MIP images confirms the  above findings IMPRESSION: CT Brain Perfusion CBF (<30%) Volume: 46mL Perfusion (Tmax>6.0s) volume: 85mL Mismatch Volume: 73mL CT HEAD No intracranial hemorrhage or CT evidence of large acute infarct. CTA NECK FINDINGS Ascending thoracic aorta measures up to 3.9 cm. Recommend annual imaging followup by CTA or MRA. This recommendation follows 2010 ACCF/AHA/AATS/ACR/ASA/SCA/SCAI/SIR/STS/SVM Guidelines for the Diagnosis and Management of Patients with Thoracic Aortic Disease. Circulation.2010; [id number] Post CABG. One of the jumped grafts is occluded. Mild dilation at the arch/proximal descending thoracic aortic junction with prominent complex plaque with small ULCERATIONS. Plaque and ectasia involving portions both common carotid arteries and both internal carotid arteries. Less than 60% diameter narrowing. Left vertebral artery arises directly from the aortic arch with mild to slightly moderate narrowing proximal aspect. Moderate narrowing proximal right subclavian artery with complex plaque. Moderate narrowing origin of the right vertebral artery. Mild narrowing vertebral arteries at the C1-2 level. CTA HEAD Anterior circulation without medium or large size vessel significant stenosis or occlusion. Calcified vertebral arteries with  mild to slightly moderate narrowing at the level the foramen magnum. No significant stenosis of the basilar artery. Moderate narrowing proximal right posterior cerebral artery. Bilateral posterior cerebral artery distal branch vessel mild narrowing and irregularity. Electronically Signed   By: Genia Del M.D.   On: 08/20/2016 12:34   Dg Chest Portable 1 View  Result Date: 08/20/2016 CLINICAL DATA:  Stroke-like symptoms EXAM: PORTABLE CHEST 1 VIEW COMPARISON:  None. FINDINGS: Cardiac shadow is mildly enlarged. Postsurgical changes are noted. The lungs are well aerated bilaterally without focal infiltrate or sizable effusion. No acute bony abnormality is noted. IMPRESSION: No acute  abnormality seen. Electronically Signed   By: Inez Catalina M.D.   On: 08/20/2016 12:08   Mr Jodene Nam Head/brain [id number] Cm  Result Date: 08/20/2016 EXAM: MRI HEAD WITHOUT CONTRAST MRA HEAD WITHOUT CONTRAST TECHNIQUE: Multiplanar, multiecho pulse sequences of the brain and surrounding structures were obtained without intravenous contrast. Angiographic images of the head were obtained using MRA technique without contrast. COMPARISON:  CT 08/20/2016. FINDINGS: MRI HEAD FINDINGS Brain: Acute nonhemorrhagic left paracentral pontine infarct. Remote tiny left caudate head infarct. Remote tiny right cerebellar infarct. Mild chronic microvascular changes. Mild global atrophy without hydrocephalus. Prominent choroid cysts incidentally noted. No intracranial mass lesion noted on this unenhanced exam. Vascular: As below. Skull and upper cervical spine: C3-4 protrusion with moderate spinal stenosis with mild to moderate cord flattening. Sinuses/Orbits: Post lens replacement without acute abnormality. Visualized sinuses are clear. Other: Negative MRA HEAD FINDINGS Mild to slightly moderate narrowing distal vertebral arteries bilaterally. Mild narrowing mid basilar artery. Only proximal aspect of the right posterior inferior cerebellar artery is well delineated with narrowed vessel beyond this region. Mild to moderate narrowing proximal left posterior inferior cerebellar artery. Nonvisualized anterior inferior cerebellar artery bilaterally. Marked narrowing proximal right posterior cerebral artery with moderate to marked narrowing distal right posterior cerebral artery branches. Moderate tandem stenosis mid aspect left posterior cerebral artery. Mild to moderate tandem stenosis right superior cerebellar artery. Moderate narrowing distal M1 segment left middle cerebral artery. Ectatic slightly irregular cavernous segment internal carotid artery bilaterally without significant stenosis. Fetal contribution to the right posterior cerebral  artery. IMPRESSION: MRI HEAD Acute nonhemorrhagic left paracentral pontine infarct. Remote tiny left caudate head infarct. Remote tiny right cerebellar infarct. Mild chronic microvascular changes. C3-4 protrusion with moderate spinal stenosis with mild to moderate cord flattening. MRA HEAD Intracranial atherosclerotic changes more notable involving posterior circulation as detailed above. These results were called by telephone at the time of interpretation on 08/20/2016 at 4:24 pm to Dr. Sharene Butters , who verbally acknowledged these results. Electronically Signed   By: Genia Del M.D.   On: 08/20/2016 17:08    Carotid Doppler   There is 1-39% bilateral ICA stenosis. Vertebral artery flow is antegrade.    2D Echocardiogram  - Left ventricle: The cavity size was normal. There was moderate focal basal hypertrophy. Systolic function was normal. The estimated ejection fraction was in the range of 55% to 60%. Wall motion was normal; there were no regional wall motion abnormalities. Doppler parameters are consistent with abnormal left ventricular relaxation (grade 1 diastolic dysfunction). Doppler parameters are consistent with high ventricular filling pressure. - Aortic valve: Severely calcified annulus. Trileaflet; normal thickness, moderately calcified leaflets. There was moderate regurgitation. Regurgitation pressure half-time: 378 ms. - Aorta: Ascending aorta diameter: 37 mm (ED). - Ascending aorta: The ascending aorta was mildly dilated. - Mitral valve: Severely calcified annulus. There was mild regurgitation. - Pulmonary arteries: PA peak pressure:  46 mm Hg (S). Impressions:   The right ventricular systolic pressure was increased consistent with moderate pulmonary hypertension.   PHYSICAL EXAM  pleasant frail elderly Caucasian lady currently not in distress. . Afebrile. Head is nontraumatic. Neck is supple without bruit.    Cardiac exam no murmur or gallop. Lungs are clear to auscultation. Distal  pulses are well felt. Neurological Exam ;  Awake  Alert oriented x 3.Dysarthric speech and right lower face weakness..eye movements full without nystagmus.fundi were not visualized. Vision acuity and fields appear normal. Hearing is normal. Palatal movements are normal. Face symmetric. Tongue midline. Normal strength, tone, reflexes and coordination l sensation on left side and grade 3/5 right UE and 4/5 RLE weakness.. Gait deferred.  ASSESSMENT/PLAN [name] is a 83 y.o. female with history of HTN,CAD s/p CABG in 1995,with stents, HLD,  Systolic CHF presenting with R sided weakness. She did not receive IV t-PA due to delay in arrival.   Stroke:  L paracentral pontine and L caudate head infarct, felt to be secondary to small vessel disease   Resultant  R hemiparesis  CTA H&N no large infarct, ascending thoracic arota 3.9cm, post CABG grafts occluded, descending thoracic aortic jxn w/ complex plaque w/ sm ulcerations, plaque both CCA, RSCA complex plaque  CTP mismatch 69mL  MRI  L paracentral pontine infarct. Tiny L caudate head infarct. Old R cerebellar infarct. C3-4 mod spinal stenosis and cord flattening  MRA  Posterior circulation atherosclerosis  Carotid Doppler  No significant stenosis   2D Echo  EF 55-60%. No source of embolus  LDL 94  HgbA1c pending  Heparin 5000 units sq tid for VTE prophylaxis  Diet NPO time specified  aspirin 81 mg daily prior to admission, now on aspirin 325 mg daily. Pt prefers not to be changed to plavix, so will Change to  dipyridamole SR 250 mg/aspirin 25 mg orally twice a day for secondary stroke prevention. To prevent headache, most common side effect of Aggrenox, will start Aggrenox q hs x 2 weeks then increase Aggrenox to bid.  Until then, aspirin 81 mg q am x 2 weeks, then discontinue. May take Tylenol 650 mg 1 hr prior to Aggrenox for the first week, then discontinue.    Patient counseled to be compliant with her antithrombotic  medications  Ongoing aggressive stroke risk factor management  Therapy recommendations:  CIR  Disposition:  pending   Hypertension  Stable  Permissive hypertension (OK if < 220/120) but gradually normalize in 5-7 days  Long-term BP goal normotensive  Hyperlipidemia  Home meds:  zocor 40 mg daily, resumed in hospital  LDL 94, goal < 70  Consider increase zocor or change to lipitor for better control  Continue statin at discharge  Other Stroke Risk Factors  Advanced age  Family hx stroke (mother)  Coronary artery disease s/p CABG  Other Active Problems  hypothyroidism  Hospital day # [place] for Pager information 08/21/2016 3:14 PM  I have personally examined this patient, reviewed notes, independently viewed imaging studies, participated in medical decision making and plan of care.ROS completed by me personally and pertinent positives fully documented  I have made any additions or clarifications directly to the above note. Agree with note above. She presented with dysarthria and right hemiparesis secondary to left pontine infarct from small vessel disease. Agree with changing aspirin to Aggrenox for secondary stroke prevention and aggressive risk factor modification. Physical occupational therapy and rehabilitation  consults. Greater than 50% time during this 35 minute visit was spent on counseling and coordination of care about recurrent stroke risk, prevention and treatment  Antony Contras, MD Medical Director [facility] Pager: [phone] 08/21/2016 3:24 PM  To contact Stroke Continuity provider, please refer to [email or web address] After hours, contact General Neurology

## 2016-08-21 NOTE — Progress Notes (Addendum)
Patient ID: Yvonne Parrish, female   DOB: [birth year], 83 y.o.   MRN: [id number]  PROGRESS NOTE    Yvonne Parrish  [name] DOB: November 20, 1928 DOA: 08/20/2016  PCP: Tracie Harrier, MD   Brief Narrative:  83 y.o. female with medical history significant for hypertension, coronary artery disease status post CABG in 1995 with stents, dyslipidemia who presented with right-sided weakness and dysphagia. Patient reported waking up on the day of the admission with right eye pain which was then followed by right side weakness. Patient reported no dizziness or vision changes. No headaches. No seizures or confusion. Patient takes aspirin daily. On admission, patient was hemodynamically stable. Blood work demonstrated white blood cell count of 11.2, normal creatinine, normal troponin. Patient was found to have acute nonhemorrhagic pontine infarct. Patient seen by neurology in consultation.    Assessment & Plan:   Acute nonhemorrhagic left paracentral pontine infarct / Right side weakness Stroke work up initiated:  - Aspirin daily, aggrenox BID - MRI brain / MRA brain - Acute nonhemorrhagic left paracentral pontine infarct. Remote tiny left caudate head infarct. Remote tiny right cerebellar infarct. Mild chronic microvascular changes. C3-4 protrusion with moderate spinal stenosis with mild to moderate cord flattening. - CT head - no acute intracranial findings  - CTA neck - One of the jumped grafts is occluded. Mild dilation at the arch/proximal descending thoracic aortic junction with prominent complex plaque with small ulcerations. Plaque and ectasia involving portions both common carotid arteries and both internal carotid arteries. Less than 60% diameter narrowing. Left vertebral artery arises directly from the aortic arch with mild to slightly moderate narrowing proximal aspect. Moderate narrowing proximal right subclavian artery with complex plaque. Moderate narrowing origin of the right vertebral artery.  Mild narrowing vertebral arteries at the C1-2 level. CTA HEAD Anterior circulation without medium or large size vessel significant stenosis or occlusion. Calcified vertebral arteries with mild to slightly moderate narrowing at the level the foramen magnum. No significant stenosis of the basilar artery. Moderate narrowing proximal right posterior cerebral artery. Bilateral posterior cerebral artery distal branch vessel mild narrowing and irregularity.  - 2D ECHO - EF 55-60% with grade 1 DD - Carotid doppler - Preliminary findings: Bilateral 1-39% ICA stenosis, antegrade vertebral flow. - HgbA1c is pending - LDL 94, goal less than 100; pt on simvastatin 40 mg at bedtime  - Diet: Nper SLP dysphagia 3  - Therapy: PT/OT - Appreciate neurology following  Dyslipidemia - Continue statin therapy   Essential hypertension - Continue lisinopril, metoprolol  Hypothyroidism - Continue Synthroid 88 g daily   DVT prophylaxis: Heparin subQ Code Status: DNR/DNI Family Communication: no family at the bedside this am Disposition Plan: home once cleared by neurology    Consultants:   SLP  PT/OT  Neurology   Procedures:   Carotid doppler - Preliminary findings: Bilateral 1-39% ICA stenosis, antegrade vertebral flow.  Echo - ef 55-60% WITH GRADE 1 dd  Antimicrobials:   None    Subjective: No overnight events.   Objective: Vitals:   08/21/16 0045 08/21/16 0245 08/21/16 0445 08/21/16 0643  BP: (!) 142/57 (!) 157/55 (!) 154/60 (!) 152/72  Pulse: 78 79 80 77  Resp: 16 16 16 16   Temp: 98.8 F (37.1 C) 98.5 F (36.9 C) 98.7 F (37.1 C) 98.2 F (36.8 C)  TempSrc: Oral Oral Oral Oral  SpO2: 98% 98% 99% 98%  Weight:      Height:        Intake/Output Summary (Last 24 hours) at  08/21/16 1223 Last data filed at 08/21/16 0817  Gross per 24 hour  Intake              600 ml  Output                0 ml  Net              600 ml   Filed Weights   08/20/16 1024 08/20/16 1841  Weight:  64.9 kg (143 lb) 67.2 kg (148 lb 1.6 oz)    Examination:  General exam: Appears calm and comfortable  Respiratory system: Clear to auscultation. Respiratory effort normal. Cardiovascular system: S1 & S2 heard, RRR. SEM +2 appreciated  Gastrointestinal system: Abdomen is nondistended, soft and nontender. No organomegaly or masses felt. Normal bowel sounds heard. Central nervous system: No focal neurological deficits. Extremities: Symmetric 5 x 5 power. (+1) LE pitting edema  Skin: No rashes, lesions or ulcers Psychiatry: Judgement and insight appear normal. Mood & affect appropriate.   Data Reviewed: I have personally reviewed following labs and imaging studies  CBC:  Recent Labs Lab 08/20/16 1023  WBC 11.2*  NEUTROABS 8.0*  HGB 12.3  HCT 36.1  MCV 96.5  PLT [id number]   Basic Metabolic Panel:  Recent Labs Lab 08/20/16 1023  NA 135  K 3.8  CL 100*  CO2 26  GLUCOSE 120*  BUN 8  CREATININE 0.90  CALCIUM 9.4   GFR: Estimated Creatinine Clearance: 40.6 mL/min (by C-G formula based on SCr of 0.9 mg/dL). Liver Function Tests:  Recent Labs Lab 08/20/16 1023  AST 22  ALT 12*  ALKPHOS 77  BILITOT 0.6  PROT 7.4  ALBUMIN 3.8   No results for input(s): LIPASE, AMYLASE in the last 168 hours. No results for input(s): AMMONIA in the last 168 hours. Coagulation Profile:  Recent Labs Lab 08/20/16 1023 08/20/16 1741  INR 1.02 1.01   Cardiac Enzymes:  Recent Labs Lab 08/20/16 1741  TROPONINI <0.03   BNP (last 3 results) No results for input(s): PROBNP in the last 8760 hours. HbA1C: No results for input(s): HGBA1C in the last 72 hours. CBG: No results for input(s): GLUCAP in the last 168 hours. Lipid Profile:  Recent Labs  08/21/16 0628  CHOL 168  HDL 57  LDLCALC 94  TRIG 87  CHOLHDL 2.9   Thyroid Function Tests: No results for input(s): TSH, T4TOTAL, FREET4, T3FREE, THYROIDAB in the last 72 hours. Anemia Panel: No results for input(s): VITAMINB12,  FOLATE, FERRITIN, TIBC, IRON, RETICCTPCT in the last 72 hours. Urine analysis:    Component Value Date/Time   COLORURINE YELLOW 08/20/2016 1422   APPEARANCEUR CLEAR 08/20/2016 1422   LABSPEC 1.028 08/20/2016 1422   PHURINE 7.0 08/20/2016 1422   GLUCOSEU NEGATIVE 08/20/2016 1422   HGBUR SMALL (A) 08/20/2016 1422   BILIRUBINUR NEGATIVE 08/20/2016 1422   KETONESUR NEGATIVE 08/20/2016 1422   PROTEINUR NEGATIVE 08/20/2016 1422   NITRITE NEGATIVE 08/20/2016 1422   LEUKOCYTESUR MODERATE (A) 08/20/2016 1422   Sepsis Labs: @LABRCNTIP (procalcitonin:4,lacticidven:4)   Culture, blood (routine x 2)     Status: None (Preliminary result)   Collection Time: 08/20/16  5:41 PM  Result Value Ref Range Status   Specimen Description BLOOD RIGHT HAND  Final   Special Requests IN PEDIATRIC BOTTLE 3CC  Final   Culture NO GROWTH < 24 HOURS  Final   Report Status PENDING  Incomplete  Culture, blood (routine x 2)     Status: None (Preliminary result)   Collection  Time: 08/20/16  5:41 PM  Result Value Ref Range Status   Specimen Description BLOOD LEFT HAND  Final   Special Requests IN PEDIATRIC BOTTLE 2CC  Final   Culture NO GROWTH < 24 HOURS  Final   Report Status PENDING  Incomplete      Radiology Studies: Ct Angio Head W Or Wo Contrast Result Date: 08/20/2016 CT Brain Perfusion CBF (<30%) Volume: 42mL Perfusion (Tmax>6.0s) volume: 13mL Mismatch Volume: 25mL CT HEAD No intracranial hemorrhage or CT evidence of large acute infarct. CTA NECK FINDINGS Ascending thoracic aorta measures up to 3.9 cm. Recommend annual imaging followup by CTA or MRA. This recommendation follows 2010 ACCF/AHA/AATS/ACR/ASA/SCA/SCAI/SIR/STS/SVM Guidelines for the Diagnosis and Management of Patients with Thoracic Aortic Disease. Circulation.2010; [id number] Post CABG. One of the jumped grafts is occluded. Mild dilation at the arch/proximal descending thoracic aortic junction with prominent complex plaque with small ULCERATIONS.  Plaque and ectasia involving portions both common carotid arteries and both internal carotid arteries. Less than 60% diameter narrowing. Left vertebral artery arises directly from the aortic arch with mild to slightly moderate narrowing proximal aspect. Moderate narrowing proximal right subclavian artery with complex plaque. Moderate narrowing origin of the right vertebral artery. Mild narrowing vertebral arteries at the C1-2 level. CTA HEAD Anterior circulation without medium or large size vessel significant stenosis or occlusion. Calcified vertebral arteries with mild to slightly moderate narrowing at the level the foramen magnum. No significant stenosis of the basilar artery. Moderate narrowing proximal right posterior cerebral artery. Bilateral posterior cerebral artery distal branch vessel mild narrowing and irregularity. Electronically Signed   By: Genia Del M.D.   On: 08/20/2016 12:34   Ct Head Wo Contrast Result Date: 08/20/2016 Minimal chronic ischemic white matter disease. No acute intracranial abnormality seen.   Ct Angio Neck W And/or Wo Contrast Result Date: 08/20/2016 CT Brain Perfusion CBF (<30%) Volume: 54mL Perfusion (Tmax>6.0s) volume: 76mL Mismatch Volume: 63mL CT HEAD No intracranial hemorrhage or CT evidence of large acute infarct. CTA NECK FINDINGS Ascending thoracic aorta measures up to 3.9 cm. Recommend annual imaging followup by CTA or MRA. This recommendation follows 2010 ACCF/AHA/AATS/ACR/ASA/SCA/SCAI/SIR/STS/SVM Guidelines for the Diagnosis and Management of Patients with Thoracic Aortic Disease. Circulation.2010; [id number] Post CABG. One of the jumped grafts is occluded. Mild dilation at the arch/proximal descending thoracic aortic junction with prominent complex plaque with small ULCERATIONS. Plaque and ectasia involving portions both common carotid arteries and both internal carotid arteries. Less than 60% diameter narrowing. Left vertebral artery arises directly from the  aortic arch with mild to slightly moderate narrowing proximal aspect. Moderate narrowing proximal right subclavian artery with complex plaque. Moderate narrowing origin of the right vertebral artery. Mild narrowing vertebral arteries at the C1-2 level. CTA HEAD Anterior circulation without medium or large size vessel significant stenosis or occlusion. Calcified vertebral arteries with mild to slightly moderate narrowing at the level the foramen magnum. No significant stenosis of the basilar artery. Moderate narrowing proximal right posterior cerebral artery. Bilateral posterior cerebral artery distal branch vessel mild narrowing and irregularity. Electronically Signed   By: Genia Del M.D.   On: 08/20/2016 12:34   Mr Brain Wo Contrast Result Date: 08/20/2016 MRI HEAD Acute nonhemorrhagic left paracentral pontine infarct. Remote tiny left caudate head infarct. Remote tiny right cerebellar infarct. Mild chronic microvascular changes. C3-4 protrusion with moderate spinal stenosis with mild to moderate cord flattening. MRA HEAD Intracranial atherosclerotic changes more notable involving posterior circulation as detailed above. These results were called by telephone at  the time of interpretation on 08/20/2016 at 4:24 pm to Dr. Sharene Butters , who verbally acknowledged these results. Electronically Signed   By: Genia Del M.D.   On: 08/20/2016 17:08   Ct Cerebral Perfusion W Contrast Result Date: 08/20/2016 CT Brain Perfusion CBF (<30%) Volume: 57mL Perfusion (Tmax>6.0s) volume: 23mL Mismatch Volume: 13mL CT HEAD No intracranial hemorrhage or CT evidence of large acute infarct. CTA NECK FINDINGS Ascending thoracic aorta measures up to 3.9 cm. Recommend annual imaging followup by CTA or MRA. This recommendation follows 2010 ACCF/AHA/AATS/ACR/ASA/SCA/SCAI/SIR/STS/SVM Guidelines for the Diagnosis and Management of Patients with Thoracic Aortic Disease. Circulation.2010; [id number] Post CABG. One of the jumped grafts  is occluded. Mild dilation at the arch/proximal descending thoracic aortic junction with prominent complex plaque with small ULCERATIONS. Plaque and ectasia involving portions both common carotid arteries and both internal carotid arteries. Less than 60% diameter narrowing. Left vertebral artery arises directly from the aortic arch with mild to slightly moderate narrowing proximal aspect. Moderate narrowing proximal right subclavian artery with complex plaque. Moderate narrowing origin of the right vertebral artery. Mild narrowing vertebral arteries at the C1-2 level. CTA HEAD Anterior circulation without medium or large size vessel significant stenosis or occlusion. Calcified vertebral arteries with mild to slightly moderate narrowing at the level the foramen magnum. No significant stenosis of the basilar artery. Moderate narrowing proximal right posterior cerebral artery. Bilateral posterior cerebral artery distal branch vessel mild narrowing and irregularity. Electronically Signed   By: Genia Del M.D.   On: 08/20/2016 12:34   Dg Chest Portable 1 View Result Date: 08/20/2016 No acute abnormality seen. Electronically Signed   By: Inez Catalina M.D.   On: 08/20/2016 12:08   Mr Jodene Nam Head/brain [id number] Cm Result Date: 08/20/2016 MRI HEAD Acute nonhemorrhagic left paracentral pontine infarct. Remote tiny left caudate head infarct. Remote tiny right cerebellar infarct. Mild chronic microvascular changes. C3-4 protrusion with moderate spinal stenosis with mild to moderate cord flattening. MRA HEAD Intracranial atherosclerotic changes more notable involving posterior circulation as detailed above. These results were called by telephone at the time of interpretation on 08/20/2016 at 4:24 pm to Dr. Sharene Butters , who verbally acknowledged these results. Electronically Signed   By: Genia Del M.D.   On: 08/20/2016 17:08     Scheduled Meds: . [START ON 08/22/2016] aspirin  81 mg Oral Daily  . dipyridamole-aspirin  1  capsule Oral QHS   Followed by  . [START ON 09/04/2016] dipyridamole-aspirin  1 capsule Oral BID  . famotidine  20 mg Oral BID  . heparin  5,000 Units Subcutaneous Q8H  . levothyroxine  88 mcg Oral QAC breakfast  . lisinopril  40 mg Oral Daily  . metoprolol succinate  50 mg Oral Daily  . simvastatin  40 mg Oral q1800   Continuous Infusions: . sodium chloride 50 mL/hr at 08/20/16 1848     LOS: 1 day    Time spent: 25 minutes  Greater than 50% of the time spent on counseling and coordinating the care.   Leisa Lenz, MD [facility] Pager [phone]  If 7PM-7AM, please contact night-coverage [email or web address] Password [facility] 08/21/2016, 12:23 PM

## 2016-08-21 NOTE — Consult Note (Signed)
Physical Medicine and Rehabilitation Consult Reason for Consult: Left paracentral pontine left caudate head infarct Referring Physician: [facility]   HPI: Yvonne Parrish is a 83 y.o. right handed female with history of diastolic congestive heart failure, COPD, hypertension, CAD with CABG maintain on aspirin. Per chart review and daughter, patient lives alone and was independent with assistive device prior to admission. Daughter states that someone will be available on discharge for assistance. One level home with one step to entry. Family is working to arrange 24-hour assistance. Presented 08/20/2016 with right-sided weakness and slurred speech. CT/MRI showed acute nonhemorrhagic left paracentral pontine infarct. Remote tiny left caudate head infarct. MRA of the head with atherosclerotic type changes. CT of the head with no significant stenosis or occlusion. Echocardiogram with ejection fraction of [id number] grade 1 diastolic dysfunction. Patient did not receive TPA. Carotid Doppler no significant stenosis. Neurology consulted presently on Aggrenox for CVA prophylaxis. Subcutaneous heparin for DVT prophylaxis. Presently on a dysphagia #3 honey thick liquid diet.   Review of Systems  Constitutional: Negative for chills and fever.  HENT: Negative for hearing loss and tinnitus.   Eyes: Negative for blurred vision and double vision.  Respiratory: Negative for cough and shortness of breath.   Cardiovascular: Negative for chest pain, palpitations and leg swelling.  Gastrointestinal: Positive for constipation. Negative for nausea and vomiting.  Genitourinary: Negative for dysuria and hematuria.  Musculoskeletal: Positive for myalgias.  Skin: Negative for rash.  Neurological: Positive for speech change and weakness. Negative for seizures.  All other systems reviewed and are negative.  Past Medical History:  Diagnosis Date  . Blood dyscrasia   . CHF (congestive heart failure) ([place])   . COPD (chronic  obstructive pulmonary disease) ([place])   . Heart murmur   . Hypertension   . Hypothyroidism    Past Surgical History:  Procedure Laterality Date  . CORONARY ANGIOPLASTY    . CORONARY ARTERY BYPASS GRAFT  1999  . KNEE ARTHROSCOPY     Family History  Problem Relation Age of Onset  . Stroke Mother   . Heart disease Father    Social History:  reports that she has never smoked. She has never used smokeless tobacco. She reports that she does not drink alcohol or use drugs. Allergies: No Known Allergies Medications Prior to Admission  Medication Sig Dispense Refill  . albuterol (PROVENTIL HFA;VENTOLIN HFA) 108 (90 Base) MCG/ACT inhaler Inhale 1-2 puffs into the lungs every 4 (four) hours as needed for wheezing.    Marland Kitchen aspirin EC 81 MG tablet Take 81 mg by mouth daily.    Marland Kitchen azelastine (ASTELIN) 0.1 % nasal spray Place 2 sprays into both nostrils daily as needed for rhinitis or allergies.     . Calcium Carbonate-Vit D-Min (CALTRATE 600+D PLUS PO) Take 1 tablet by mouth 2 (two) times daily.    . Cholecalciferol (VITAMIN D-3) 1000 units CAPS Take 1,000 Units by mouth daily.    . cyclobenzaprine (FLEXERIL) 10 MG tablet Take 10 mg by mouth 2 (two) times daily. MORNING AND BEDTIME    . docusate sodium (COLACE) 100 MG capsule Take 100 mg by mouth 2 (two) times daily as needed for mild constipation.    Marland Kitchen levothyroxine (SYNTHROID, LEVOTHROID) 88 MCG tablet Take 88 mcg by mouth daily before breakfast.    . lisinopril (PRINIVIL,ZESTRIL) 40 MG tablet Take 40 mg by mouth daily.    Marland Kitchen loratadine (CLARITIN) 10 MG tablet Take 10 mg by mouth daily.    Marland Kitchen  metoprolol succinate (TOPROL-XL) 50 MG 24 hr tablet Take 50 mg by mouth daily.    . naproxen sodium (ANAPROX) 220 MG tablet Take 1-2 tablets by mouth 2 (two) times daily as needed for pain.    . polyethylene glycol powder (GLYCOLAX/MIRALAX) powder Take 17 g by mouth daily.    . pseudoephedrine-guaifenesin (MUCINEX D) 60-600 MG 12 hr tablet Take 1 tablet by mouth  at bedtime.    . ranitidine (ZANTAC) 150 MG tablet Take 150 mg by mouth 2 (two) times daily.    . simvastatin (ZOCOR) 40 MG tablet Take 40 mg by mouth at bedtime.      Home: Home Living Family/patient expects to be discharged to:: Private residence Living Arrangements: Alone Available Help at Discharge: Family, Available PRN/intermittently (daughter reports working on setting up 24/7 assist.) Type of Home: House Home Access: Stairs to enter Technical brewer of Steps: 1 Entrance Stairs-Rails: None Home Layout: One level Bathroom Shower/Tub: Multimedia programmer: Handicapped height [place]: Environmental consultant - 4 wheels, Shower seat, Hand held shower head, Grab bars - tub/shower  Functional History: Prior Function Level of Independence: Independent with assistive device(s) Comments: history of R LE sciatica with screws in R knee Functional Status:  Mobility: Bed Mobility Overal bed mobility: Needs Assistance Bed Mobility: Supine to Sit Supine to sit: HOB elevated, Min assist General bed mobility comments: for R LE off EOB, increased time and effort to scoot to EOB Transfers Overall transfer level: Needs assistance Equipment used: Rolling walker (2 wheeled) Transfers: Sit to/from Stand Sit to Stand: Mod assist, +2 physical assistance General transfer comment: lifting assist from EOB, increased time and assist for hand placement on walker Ambulation/Gait Ambulation/Gait assistance: Mod assist, +2 physical assistance Ambulation Distance (Feet): 2 Feet Assistive device: Rolling walker (2 wheeled) Gait Pattern/deviations: Step-to pattern, Decreased step length - right, Shuffle, Decreased dorsiflexion - right General Gait Details: assist to progress R LE, difficulty progressing L due to R knee buckling; after couple of steps unable to continue due to weakness so brought chair up behind pt    ADL:    Cognition: Cognition Overall Cognitive Status: Impaired/Different  from baseline Orientation Level: Oriented X4 Cognition Arousal/Alertness: Awake/alert Behavior During Therapy: WFL for tasks assessed/performed Overall Cognitive Status: Impaired/Different from baseline Area of Impairment: Attention, Orientation, Awareness Orientation Level: Disoriented to, Time Current Attention Level: Sustained Awareness: Intellectual General Comments: some decreased R side awareness  Blood pressure (!) 128/46, pulse 76, temperature 98.1 F (36.7 C), temperature source Oral, resp. rate 15, height 5' 1.5" (1.562 m), weight 67.2 kg (148 lb 1.6 oz), SpO2 98 %. Physical Exam  Vitals reviewed. Constitutional: She is oriented to person, place, and time. She appears well-developed and well-nourished.  HENT:  Head: Normocephalic and atraumatic.  Eyes: Conjunctivae and EOM are normal.  Neck: Normal range of motion. Neck supple. No thyromegaly present.  Cardiovascular: Normal rate and regular rhythm.   Respiratory: Effort normal and breath sounds normal. No respiratory distress. She has no wheezes.  GI: Soft. Bowel sounds are normal. She exhibits no distension.  Musculoskeletal: She exhibits no edema or tenderness.  Neurological: She is alert and oriented to person, place, and time.  Makes eye contact with examiner.  Speech is dysarthric but intelligible.  DTRs symmetric Sensation diminished to light touch RLE>RUE Motor: LUE: 5/5 proximal to distal LLE: 4+/5 proximally, 5/5 distally RUE: 2/5 proximal to distal RLE: 4-/5 proximal to distal  Skin: Skin is warm and dry.  Psychiatric: She has a normal mood  and affect. Her behavior is normal.    Results for orders placed or performed during the hospital encounter of 08/20/16 (from the past 24 hour(s))  Protime-INR     Status: None   Collection Time: 08/20/16  5:41 PM  Result Value Ref Range   Prothrombin Time 13.4 11.4 - 15.2 seconds   INR 1.01   APTT     Status: None   Collection Time: 08/20/16  5:41 PM  Result  Value Ref Range   aPTT 30 24 - 36 seconds  Troponin I     Status: None   Collection Time: 08/20/16  5:41 PM  Result Value Ref Range   Troponin I <0.03 <0.03 ng/mL  Culture, blood (routine x 2)     Status: None (Preliminary result)   Collection Time: 08/20/16  5:41 PM  Result Value Ref Range   Specimen Description BLOOD RIGHT HAND    Special Requests IN PEDIATRIC BOTTLE 3CC    Culture NO GROWTH < 24 HOURS    Report Status PENDING   Culture, blood (routine x 2)     Status: None (Preliminary result)   Collection Time: 08/20/16  5:41 PM  Result Value Ref Range   Specimen Description BLOOD LEFT HAND    Special Requests IN PEDIATRIC BOTTLE 2CC    Culture NO GROWTH < 24 HOURS    Report Status PENDING   Lipid panel     Status: None   Collection Time: 08/21/16  6:28 AM  Result Value Ref Range   Cholesterol 168 0 - 200 mg/dL   Triglycerides 87 <150 mg/dL   HDL 57 >40 mg/dL   Total CHOL/HDL Ratio 2.9 RATIO   VLDL 17 0 - 40 mg/dL   LDL Cholesterol 94 0 - 99 mg/dL   Ct Angio Head W Or Wo Contrast  Result Date: 08/20/2016 CLINICAL DATA:  83 year old hypertensive female with right-sided facial droop and right leg weakness. Last seen normal at midnight. Subsequent encounter. EXAM: CT ANGIOGRAPHY HEAD AND NECK. CT PERFUSION BRAIN TECHNIQUE: Multiphase CT imaging of the brain was performed following IV bolus contrast injection. Subsequent parametric perfusion maps were calculated using RAPID software. Multidetector CT imaging of the head and neck was performed using the standard protocol during bolus administration of intravenous contrast. Multiplanar CT image reconstructions and MIPs were obtained to evaluate the vascular anatomy. Carotid stenosis measurements (when applicable) are obtained utilizing NASCET criteria, using the distal internal carotid diameter as the denominator. CONTRAST:  100 cc Isovue 370. COMPARISON:  08/20/2016 head CT. FINDINGS: CT Brain Perfusion Findings: CBF (<30%) Volume: 39mL  Perfusion (Tmax>6.0s) volume: 75mL Mismatch Volume: 36mL Infarction Location:Not identified CT HEAD FINDINGS Brain: No intracranial hemorrhage or CT evidence of large acute infarct. No intracranial mass or abnormal enhancement. No hydrocephalus. Vascular: As below. Skull: Negative. Sinuses: Minimal ethmoid sinus air cell mucosal thickening. Orbits: Post lens replacement without acute abnormality. Review of the MIP images confirms the above findings CTA NECK FINDINGS Aortic arch: 4 vessel arch with left vertebral artery arising directly from the arch. Mild to slightly moderate narrowing origin of the left vertebral artery and left subclavian artery. Ascending thoracic aorta measures up to 3.9 cm. Post CABG. One of the jumped grafts is occluded. Mild dilation at the arch/proximal descending thoracic aortic junction with prominent complex plaque in small ulcerations. Right carotid system: Plaque and ectasia involving portions of the right common carotid artery and internal carotid artery. Less than 60% diameter narrowing. Left carotid system: Plaque and ectasia involving portions  of the left common carotid artery and internal carotid artery. Less than 60% diameter narrowing. Vertebral arteries: Left vertebral artery arises directly from the aortic arch with mild slightly moderate narrowing proximal aspect. Moderate narrowing proximal right subclavian artery with complex plaque. Moderate narrowing origin of the right vertebral artery. Mild narrowing vertebral arteries at the C1-2 level. Skeleton: Cervical spondylotic changes most notable C6-7 followed by the C3-4 level. Remote Schmorl's node deformity and 20% loss of height superior endplate T5. Left lobe of thyroid gland extends slightly inferiorly but without worrisome mass. Mild symmetric prominence lingual tonsillar tissue with pulled secretions vallecula. Right carotid artery impresses upon the posterior aspect of the pharynx. Upper chest: Scattered small calcified  granulomas. Review of the MIP images confirms the above findings CTA HEAD FINDINGS Anterior circulation: Anterior circulation without medium or large size vessel significant stenosis or occlusion. Cavernous segment internal carotid artery calcification ectasia with mild narrowing and dilation without high-grade stenosis. Posterior circulation: Calcified vertebral arteries with mild slightly moderate narrowing at the level the foramen magnum. No significant stenosis of the basilar artery. Moderate narrowing proximal right posterior cerebral artery. Bilateral posterior cerebral artery distal branch vessel mild narrowing and irregularity. Venous sinuses: Patent. Anatomic variants: Fetal contribution to the right posterior cerebral artery. Delayed phase: As above. Review of the MIP images confirms the above findings IMPRESSION: CT Brain Perfusion CBF (<30%) Volume: 69mL Perfusion (Tmax>6.0s) volume: 22mL Mismatch Volume: 6mL CT HEAD No intracranial hemorrhage or CT evidence of large acute infarct. CTA NECK FINDINGS Ascending thoracic aorta measures up to 3.9 cm. Recommend annual imaging followup by CTA or MRA. This recommendation follows 2010 ACCF/AHA/AATS/ACR/ASA/SCA/SCAI/SIR/STS/SVM Guidelines for the Diagnosis and Management of Patients with Thoracic Aortic Disease. Circulation.2010; [id number] Post CABG. One of the jumped grafts is occluded. Mild dilation at the arch/proximal descending thoracic aortic junction with prominent complex plaque with small ULCERATIONS. Plaque and ectasia involving portions both common carotid arteries and both internal carotid arteries. Less than 60% diameter narrowing. Left vertebral artery arises directly from the aortic arch with mild to slightly moderate narrowing proximal aspect. Moderate narrowing proximal right subclavian artery with complex plaque. Moderate narrowing origin of the right vertebral artery. Mild narrowing vertebral arteries at the C1-2 level. CTA HEAD Anterior  circulation without medium or large size vessel significant stenosis or occlusion. Calcified vertebral arteries with mild to slightly moderate narrowing at the level the foramen magnum. No significant stenosis of the basilar artery. Moderate narrowing proximal right posterior cerebral artery. Bilateral posterior cerebral artery distal branch vessel mild narrowing and irregularity. Electronically Signed   By: Genia Del M.D.   On: 08/20/2016 12:34   Ct Head Wo Contrast  Result Date: 08/20/2016 CLINICAL DATA:  Right-sided weakness, slurred speech. EXAM: CT HEAD WITHOUT CONTRAST TECHNIQUE: Contiguous axial images were obtained from the base of the skull through the vertex without intravenous contrast. COMPARISON:  None. FINDINGS: Brain: Minimal chronic ischemic white matter disease is noted. No mass effect or midline shift is noted. Ventricular size is within normal limits. There is no evidence of mass lesion, hemorrhage or acute infarction. Vascular: Atherosclerosis of carotid siphons is noted. Skull: Normal. Negative for fracture or focal lesion. Sinuses/Orbits: No acute finding. Other: None. IMPRESSION: Minimal chronic ischemic white matter disease. No acute intracranial abnormality seen. Electronically Signed   By: Marijo Conception, M.D.   On: 08/20/2016 10:55   Ct Angio Neck W And/or Wo Contrast  Result Date: 08/20/2016 CLINICAL DATA:  83 year old hypertensive female with right-sided facial droop  and right leg weakness. Last seen normal at midnight. Subsequent encounter. EXAM: CT ANGIOGRAPHY HEAD AND NECK. CT PERFUSION BRAIN TECHNIQUE: Multiphase CT imaging of the brain was performed following IV bolus contrast injection. Subsequent parametric perfusion maps were calculated using RAPID software. Multidetector CT imaging of the head and neck was performed using the standard protocol during bolus administration of intravenous contrast. Multiplanar CT image reconstructions and MIPs were obtained to evaluate  the vascular anatomy. Carotid stenosis measurements (when applicable) are obtained utilizing NASCET criteria, using the distal internal carotid diameter as the denominator. CONTRAST:  100 cc Isovue 370. COMPARISON:  08/20/2016 head CT. FINDINGS: CT Brain Perfusion Findings: CBF (<30%) Volume: 57mL Perfusion (Tmax>6.0s) volume: 38mL Mismatch Volume: 58mL Infarction Location:Not identified CT HEAD FINDINGS Brain: No intracranial hemorrhage or CT evidence of large acute infarct. No intracranial mass or abnormal enhancement. No hydrocephalus. Vascular: As below. Skull: Negative. Sinuses: Minimal ethmoid sinus air cell mucosal thickening. Orbits: Post lens replacement without acute abnormality. Review of the MIP images confirms the above findings CTA NECK FINDINGS Aortic arch: 4 vessel arch with left vertebral artery arising directly from the arch. Mild to slightly moderate narrowing origin of the left vertebral artery and left subclavian artery. Ascending thoracic aorta measures up to 3.9 cm. Post CABG. One of the jumped grafts is occluded. Mild dilation at the arch/proximal descending thoracic aortic junction with prominent complex plaque in small ulcerations. Right carotid system: Plaque and ectasia involving portions of the right common carotid artery and internal carotid artery. Less than 60% diameter narrowing. Left carotid system: Plaque and ectasia involving portions of the left common carotid artery and internal carotid artery. Less than 60% diameter narrowing. Vertebral arteries: Left vertebral artery arises directly from the aortic arch with mild slightly moderate narrowing proximal aspect. Moderate narrowing proximal right subclavian artery with complex plaque. Moderate narrowing origin of the right vertebral artery. Mild narrowing vertebral arteries at the C1-2 level. Skeleton: Cervical spondylotic changes most notable C6-7 followed by the C3-4 level. Remote Schmorl's node deformity and 20% loss of height  superior endplate T5. Left lobe of thyroid gland extends slightly inferiorly but without worrisome mass. Mild symmetric prominence lingual tonsillar tissue with pulled secretions vallecula. Right carotid artery impresses upon the posterior aspect of the pharynx. Upper chest: Scattered small calcified granulomas. Review of the MIP images confirms the above findings CTA HEAD FINDINGS Anterior circulation: Anterior circulation without medium or large size vessel significant stenosis or occlusion. Cavernous segment internal carotid artery calcification ectasia with mild narrowing and dilation without high-grade stenosis. Posterior circulation: Calcified vertebral arteries with mild slightly moderate narrowing at the level the foramen magnum. No significant stenosis of the basilar artery. Moderate narrowing proximal right posterior cerebral artery. Bilateral posterior cerebral artery distal branch vessel mild narrowing and irregularity. Venous sinuses: Patent. Anatomic variants: Fetal contribution to the right posterior cerebral artery. Delayed phase: As above. Review of the MIP images confirms the above findings IMPRESSION: CT Brain Perfusion CBF (<30%) Volume: 61mL Perfusion (Tmax>6.0s) volume: 77mL Mismatch Volume: 75mL CT HEAD No intracranial hemorrhage or CT evidence of large acute infarct. CTA NECK FINDINGS Ascending thoracic aorta measures up to 3.9 cm. Recommend annual imaging followup by CTA or MRA. This recommendation follows 2010 ACCF/AHA/AATS/ACR/ASA/SCA/SCAI/SIR/STS/SVM Guidelines for the Diagnosis and Management of Patients with Thoracic Aortic Disease. Circulation.2010; [id number] Post CABG. One of the jumped grafts is occluded. Mild dilation at the arch/proximal descending thoracic aortic junction with prominent complex plaque with small ULCERATIONS. Plaque and ectasia involving portions both  common carotid arteries and both internal carotid arteries. Less than 60% diameter narrowing. Left vertebral  artery arises directly from the aortic arch with mild to slightly moderate narrowing proximal aspect. Moderate narrowing proximal right subclavian artery with complex plaque. Moderate narrowing origin of the right vertebral artery. Mild narrowing vertebral arteries at the C1-2 level. CTA HEAD Anterior circulation without medium or large size vessel significant stenosis or occlusion. Calcified vertebral arteries with mild to slightly moderate narrowing at the level the foramen magnum. No significant stenosis of the basilar artery. Moderate narrowing proximal right posterior cerebral artery. Bilateral posterior cerebral artery distal branch vessel mild narrowing and irregularity. Electronically Signed   By: Genia Del M.D.   On: 08/20/2016 12:34   Mr Brain Wo Contrast  Result Date: 08/20/2016 EXAM: MRI HEAD WITHOUT CONTRAST MRA HEAD WITHOUT CONTRAST TECHNIQUE: Multiplanar, multiecho pulse sequences of the brain and surrounding structures were obtained without intravenous contrast. Angiographic images of the head were obtained using MRA technique without contrast. COMPARISON:  CT 08/20/2016. FINDINGS: MRI HEAD FINDINGS Brain: Acute nonhemorrhagic left paracentral pontine infarct. Remote tiny left caudate head infarct. Remote tiny right cerebellar infarct. Mild chronic microvascular changes. Mild global atrophy without hydrocephalus. Prominent choroid cysts incidentally noted. No intracranial mass lesion noted on this unenhanced exam. Vascular: As below. Skull and upper cervical spine: C3-4 protrusion with moderate spinal stenosis with mild to moderate cord flattening. Sinuses/Orbits: Post lens replacement without acute abnormality. Visualized sinuses are clear. Other: Negative MRA HEAD FINDINGS Mild to slightly moderate narrowing distal vertebral arteries bilaterally. Mild narrowing mid basilar artery. Only proximal aspect of the right posterior inferior cerebellar artery is well delineated with narrowed vessel  beyond this region. Mild to moderate narrowing proximal left posterior inferior cerebellar artery. Nonvisualized anterior inferior cerebellar artery bilaterally. Marked narrowing proximal right posterior cerebral artery with moderate to marked narrowing distal right posterior cerebral artery branches. Moderate tandem stenosis mid aspect left posterior cerebral artery. Mild to moderate tandem stenosis right superior cerebellar artery. Moderate narrowing distal M1 segment left middle cerebral artery. Ectatic slightly irregular cavernous segment internal carotid artery bilaterally without significant stenosis. Fetal contribution to the right posterior cerebral artery. IMPRESSION: MRI HEAD Acute nonhemorrhagic left paracentral pontine infarct. Remote tiny left caudate head infarct. Remote tiny right cerebellar infarct. Mild chronic microvascular changes. C3-4 protrusion with moderate spinal stenosis with mild to moderate cord flattening. MRA HEAD Intracranial atherosclerotic changes more notable involving posterior circulation as detailed above. These results were called by telephone at the time of interpretation on 08/20/2016 at 4:24 pm to Dr. Sharene Butters , who verbally acknowledged these results. Electronically Signed   By: Genia Del M.D.   On: 08/20/2016 17:08   Ct Cerebral Perfusion W Contrast  Result Date: 08/20/2016 CLINICAL DATA:  83 year old hypertensive female with right-sided facial droop and right leg weakness. Last seen normal at midnight. Subsequent encounter. EXAM: CT ANGIOGRAPHY HEAD AND NECK. CT PERFUSION BRAIN TECHNIQUE: Multiphase CT imaging of the brain was performed following IV bolus contrast injection. Subsequent parametric perfusion maps were calculated using RAPID software. Multidetector CT imaging of the head and neck was performed using the standard protocol during bolus administration of intravenous contrast. Multiplanar CT image reconstructions and MIPs were obtained to evaluate the  vascular anatomy. Carotid stenosis measurements (when applicable) are obtained utilizing NASCET criteria, using the distal internal carotid diameter as the denominator. CONTRAST:  100 cc Isovue 370. COMPARISON:  08/20/2016 head CT. FINDINGS: CT Brain Perfusion Findings: CBF (<30%) Volume: 30mL Perfusion (Tmax>6.0s) volume:  52mL Mismatch Volume: 60mL Infarction Location:Not identified CT HEAD FINDINGS Brain: No intracranial hemorrhage or CT evidence of large acute infarct. No intracranial mass or abnormal enhancement. No hydrocephalus. Vascular: As below. Skull: Negative. Sinuses: Minimal ethmoid sinus air cell mucosal thickening. Orbits: Post lens replacement without acute abnormality. Review of the MIP images confirms the above findings CTA NECK FINDINGS Aortic arch: 4 vessel arch with left vertebral artery arising directly from the arch. Mild to slightly moderate narrowing origin of the left vertebral artery and left subclavian artery. Ascending thoracic aorta measures up to 3.9 cm. Post CABG. One of the jumped grafts is occluded. Mild dilation at the arch/proximal descending thoracic aortic junction with prominent complex plaque in small ulcerations. Right carotid system: Plaque and ectasia involving portions of the right common carotid artery and internal carotid artery. Less than 60% diameter narrowing. Left carotid system: Plaque and ectasia involving portions of the left common carotid artery and internal carotid artery. Less than 60% diameter narrowing. Vertebral arteries: Left vertebral artery arises directly from the aortic arch with mild slightly moderate narrowing proximal aspect. Moderate narrowing proximal right subclavian artery with complex plaque. Moderate narrowing origin of the right vertebral artery. Mild narrowing vertebral arteries at the C1-2 level. Skeleton: Cervical spondylotic changes most notable C6-7 followed by the C3-4 level. Remote Schmorl's node deformity and 20% loss of height superior  endplate T5. Left lobe of thyroid gland extends slightly inferiorly but without worrisome mass. Mild symmetric prominence lingual tonsillar tissue with pulled secretions vallecula. Right carotid artery impresses upon the posterior aspect of the pharynx. Upper chest: Scattered small calcified granulomas. Review of the MIP images confirms the above findings CTA HEAD FINDINGS Anterior circulation: Anterior circulation without medium or large size vessel significant stenosis or occlusion. Cavernous segment internal carotid artery calcification ectasia with mild narrowing and dilation without high-grade stenosis. Posterior circulation: Calcified vertebral arteries with mild slightly moderate narrowing at the level the foramen magnum. No significant stenosis of the basilar artery. Moderate narrowing proximal right posterior cerebral artery. Bilateral posterior cerebral artery distal branch vessel mild narrowing and irregularity. Venous sinuses: Patent. Anatomic variants: Fetal contribution to the right posterior cerebral artery. Delayed phase: As above. Review of the MIP images confirms the above findings IMPRESSION: CT Brain Perfusion CBF (<30%) Volume: 53mL Perfusion (Tmax>6.0s) volume: 32mL Mismatch Volume: 37mL CT HEAD No intracranial hemorrhage or CT evidence of large acute infarct. CTA NECK FINDINGS Ascending thoracic aorta measures up to 3.9 cm. Recommend annual imaging followup by CTA or MRA. This recommendation follows 2010 ACCF/AHA/AATS/ACR/ASA/SCA/SCAI/SIR/STS/SVM Guidelines for the Diagnosis and Management of Patients with Thoracic Aortic Disease. Circulation.2010; [id number] Post CABG. One of the jumped grafts is occluded. Mild dilation at the arch/proximal descending thoracic aortic junction with prominent complex plaque with small ULCERATIONS. Plaque and ectasia involving portions both common carotid arteries and both internal carotid arteries. Less than 60% diameter narrowing. Left vertebral artery arises  directly from the aortic arch with mild to slightly moderate narrowing proximal aspect. Moderate narrowing proximal right subclavian artery with complex plaque. Moderate narrowing origin of the right vertebral artery. Mild narrowing vertebral arteries at the C1-2 level. CTA HEAD Anterior circulation without medium or large size vessel significant stenosis or occlusion. Calcified vertebral arteries with mild to slightly moderate narrowing at the level the foramen magnum. No significant stenosis of the basilar artery. Moderate narrowing proximal right posterior cerebral artery. Bilateral posterior cerebral artery distal branch vessel mild narrowing and irregularity. Electronically Signed   By: Alcide Evener.D.  On: 08/20/2016 12:34   Dg Chest Portable 1 View  Result Date: 08/20/2016 CLINICAL DATA:  Stroke-like symptoms EXAM: PORTABLE CHEST 1 VIEW COMPARISON:  None. FINDINGS: Cardiac shadow is mildly enlarged. Postsurgical changes are noted. The lungs are well aerated bilaterally without focal infiltrate or sizable effusion. No acute bony abnormality is noted. IMPRESSION: No acute abnormality seen. Electronically Signed   By: Inez Catalina M.D.   On: 08/20/2016 12:08   Mr Jodene Nam Head/brain [id number] Cm  Result Date: 08/20/2016 EXAM: MRI HEAD WITHOUT CONTRAST MRA HEAD WITHOUT CONTRAST TECHNIQUE: Multiplanar, multiecho pulse sequences of the brain and surrounding structures were obtained without intravenous contrast. Angiographic images of the head were obtained using MRA technique without contrast. COMPARISON:  CT 08/20/2016. FINDINGS: MRI HEAD FINDINGS Brain: Acute nonhemorrhagic left paracentral pontine infarct. Remote tiny left caudate head infarct. Remote tiny right cerebellar infarct. Mild chronic microvascular changes. Mild global atrophy without hydrocephalus. Prominent choroid cysts incidentally noted. No intracranial mass lesion noted on this unenhanced exam. Vascular: As below. Skull and upper cervical spine:  C3-4 protrusion with moderate spinal stenosis with mild to moderate cord flattening. Sinuses/Orbits: Post lens replacement without acute abnormality. Visualized sinuses are clear. Other: Negative MRA HEAD FINDINGS Mild to slightly moderate narrowing distal vertebral arteries bilaterally. Mild narrowing mid basilar artery. Only proximal aspect of the right posterior inferior cerebellar artery is well delineated with narrowed vessel beyond this region. Mild to moderate narrowing proximal left posterior inferior cerebellar artery. Nonvisualized anterior inferior cerebellar artery bilaterally. Marked narrowing proximal right posterior cerebral artery with moderate to marked narrowing distal right posterior cerebral artery branches. Moderate tandem stenosis mid aspect left posterior cerebral artery. Mild to moderate tandem stenosis right superior cerebellar artery. Moderate narrowing distal M1 segment left middle cerebral artery. Ectatic slightly irregular cavernous segment internal carotid artery bilaterally without significant stenosis. Fetal contribution to the right posterior cerebral artery. IMPRESSION: MRI HEAD Acute nonhemorrhagic left paracentral pontine infarct. Remote tiny left caudate head infarct. Remote tiny right cerebellar infarct. Mild chronic microvascular changes. C3-4 protrusion with moderate spinal stenosis with mild to moderate cord flattening. MRA HEAD Intracranial atherosclerotic changes more notable involving posterior circulation as detailed above. These results were called by telephone at the time of interpretation on 08/20/2016 at 4:24 pm to Dr. Sharene Butters , who verbally acknowledged these results. Electronically Signed   By: Genia Del M.D.   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  Records reviewed and summated above. Stroke: Continue secondary stroke prophylaxis and Risk Factor  Modification listed below:   Antiplatelet therapy:   Blood Pressure Management:  Continue current medication with prn's with permisive HTN per primary team Statin Agent:   ?Diabetes management:  HbA1c pending Right sided hemiparesis: fit for orthosis to prevent contractures (resting hand splint for day, wrist cock up splint at night, PRAFO, etc) Motor recovery: Fluoxetine  1. Does the need for close, 24 hr/day medical supervision in concert with the patient's rehab needs make it unreasonable for this patient to be served in a less intensive setting? Yes  2. Co-Morbidities requiring supervision/potential complications: diastolic congestive heart failure (monitor for signs/symptoms of fluid overload), COPD (monitor RR and O2 sats with increased physical activity), HTN (monitor and provide prns in accordance with increased physical exertion and pain), CAD with CABG (cont meds), post-stroke dysphagia (advance diet as tolerated), hypothyroidism (ensure mood and affect do not limit therapies), leukocytosis (cont to monitor for signs and  symptoms of infection, further workup if indicated), hyperglycemia (HbA1c pending, monitor in accordance with exercise and adjust meds as necessary) 3. Due to safety, disease management and patient education, does the patient require 24 hr/day rehab nursing? Yes 4. Does the patient require coordinated care of a physician, rehab nurse, PT (1-2 hrs/day, 5 days/week), OT (1-2 hrs/day, 5 days/week) and SLP (1-2 hrs/day, 5 days/week) to address physical and functional deficits in the context of the above medical diagnosis(es)? Yes Addressing deficits in the following areas: balance, endurance, locomotion, strength, transferring, bathing, dressing, toileting, speech, swallowing and psychosocial support 5. Can the patient actively participate in an intensive therapy program of at least 3 hrs of therapy per day at least 5 days per week? Yes 6. The potential for patient to make  measurable gains while on inpatient rehab is excellent 7. Anticipated functional outcomes upon discharge from inpatient rehab are supervision  with PT, supervision and min assist with OT, independent and modified independent with SLP. 8. Estimated rehab length of stay to reach the above functional goals is: 13-18 days. 9. Does the patient have adequate social supports and living environment to accommodate these discharge functional goals? Yes 10. Anticipated D/C setting: Home 11. Anticipated post D/C treatments: HH therapy and Home excercise program 12. Overall Rehab/Functional Prognosis: good  RECOMMENDATIONS: This patient's condition is appropriate for continued rehabilitative care in the following setting: CIR Patient has agreed to participate in recommended program. Yes Note that insurance prior authorization may be required for reimbursement for recommended care.  Comment: Rehab Admissions Coordinator to follow up.  Cathlyn Parsons., PA-C 08/21/2016  Delice Lesch, MD, [facility]rown

## 2016-08-21 NOTE — Progress Notes (Signed)
Pt had difficulty swallowing her Aggrenox capsule, Pharmacy notified who suggested that med be opened up and the content given with apple sauce, same done, but however advised if med can be changed to an alternative by the attending, will pass on in the morning, pt reassured, will continue to monitor. [place]-As[place], Henley Boettner Efe

## 2016-08-21 NOTE — Progress Notes (Signed)
*  PRELIMINARY RESULTS* Vascular Ultrasound Carotid Duplex (Doppler) has been completed.  Preliminary findings: Bilateral 1-39% ICA stenosis, antegrade vertebral flow.   Yvonne Parrish 08/21/2016, 10:08 AM

## 2016-08-21 NOTE — Progress Notes (Signed)
Inpatient Rehabilitation  Order has been written for IP Rehab consult.  Admissions coordinator will follow up after completion of consult.    [facility] Admissions Coordinator Cell [phone] Office [phone]

## 2016-08-21 NOTE — Evaluation (Signed)
Occupational Therapy Evaluation Patient Details Name: Yvonne Parrish MRN: [id number] DOB: 1928/09/29 Today's Date: 08/21/2016    History of Present Illness 83 y.o. female with medical history significant for hypertension, coronary artery disease status post CABG in 1995 with stents, dyslipidemia who presented with right-sided weakness and dysphagia.  MRI brain / MRA brain - Acute nonhemorrhagic left paracentral pontine infarct. Remote tiny left caudate head infarct. Remote tiny right cerebellar infarct. Mild chronic microvascular changes. C3-4 protrusion with moderate spinal stenosis with mild to moderate cord flattening.   Clinical Impression   PTA, pt was independent with rollator for ADL and functional mobility and was living alone. Pt presents with decreased decreased R UE strength, AROM, and sensation for light touch and currently requires mod assist for UB ADL and max assist for LB ADL. She is able to isolate some finger movement and nearly complete composite fist. Noted extensor lag with digit extension. Pt demonstrated ability to initiate hand to mouth movements and is highly motivated to use her R hand for functional tasks. Additionally she demonstrates decreased orientation to time. Pt would benefit from CIR placement post-acute D/C in order to improve independence with ADL and maximize return to PLOF. OT will continue to follow acutely with focus on improving ADL independence and R UE functional use.    Follow Up Recommendations  CIR    Equipment Recommendations  Other (comment) (TBD at next venue of care)    Recommendations for Other Services       Precautions / Restrictions Precautions Precautions: Fall Restrictions Weight Bearing Restrictions: No      Mobility Bed Mobility Overal bed mobility: Needs Assistance Bed Mobility: Supine to Sit     Supine to sit: HOB elevated;Min assist     General bed mobility comments: for R LE off EOB, increased time and effort to scoot  to EOB  Transfers Overall transfer level: Needs assistance Equipment used: Rolling walker (2 wheeled) Transfers: Sit to/from Stand Sit to Stand: Mod assist;+2 physical assistance         General transfer comment: lifting assist from EOB, increased time and assist for hand placement on walker    Balance Overall balance assessment: Needs assistance Sitting-balance support: No upper extremity supported;Feet supported Sitting balance-Leahy Scale: Fair   Postural control: Posterior lean Standing balance support: Bilateral upper extremity supported Standing balance-Leahy Scale: Poor Standing balance comment: UE support and assist for balance                            ADL Overall ADL's : Needs assistance/impaired Eating/Feeding: Supervision/ safety;Sitting   Grooming: Sitting;Moderate assistance (for bilateral tasks)   Upper Body Bathing: Moderate assistance;Sitting   Lower Body Bathing: Maximal assistance;Sit to/from stand   Upper Body Dressing : Moderate assistance;Sitting   Lower Body Dressing: Maximal assistance;Sit to/from stand   Toilet Transfer: Moderate assistance;+2 for physical assistance;+2 for safety/equipment;RW   Toileting- Clothing Manipulation and Hygiene: Maximal assistance;Sitting/lateral lean       Functional mobility during ADLs: Moderate assistance;+2 for physical assistance;+2 for safety/equipment;Rolling walker General ADL Comments: Pt and daughter educated on techniques to improve safety with self-feeding tasks.     Vision Vision Assessment?: No apparent visual deficits   Perception     Praxis      Pertinent Vitals/Pain Pain Assessment: Faces Faces Pain Scale: Hurts a little bit Pain Location: R side Pain Descriptors / Indicators: Discomfort Pain Intervention(s): Repositioned;Monitored during session     Hand Dominance  Right   Extremity/Trunk Assessment Upper Extremity Assessment Upper Extremity Assessment: RUE  deficits/detail;LUE deficits/detail RUE Deficits / Details: Pt demonstrates decreased strength and AROM of RUE grossly. Brunstrom level IV R hand and arm. Pt demonstrates some finger isolation and nearly complete composite grasp. Some extensor lag noted with digit extension. Pt motivated to use R hand for functional tasks as she is R handed. Able to initiate hand to mouth movements but unable to complete full ROM. RUE Sensation: decreased light touch;decreased proprioception RUE Coordination: decreased fine motor;decreased gross motor LUE Deficits / Details: Generalized weakness   Lower Extremity Assessment Lower Extremity Assessment: Defer to PT evaluation RLE Deficits / Details: history of foot drop from sciatica previously; notes had some numbness, but now more, strength hip flexion  RLE Sensation: decreased light touch   Cervical / Trunk Assessment Cervical / Trunk Assessment: Kyphotic   Communication Communication Communication: Expressive difficulties   Cognition Arousal/Alertness: Awake/alert Behavior During Therapy: WFL for tasks assessed/performed Overall Cognitive Status: Impaired/Different from baseline Area of Impairment: Attention;Orientation;Awareness Orientation Level: Disoriented to;Time Current Attention Level: Sustained       Awareness: Intellectual   General Comments: Pt reporting the year as 2020. some decreased R side awareness   General Comments       Exercises       Shoulder Instructions      Home Living Family/patient expects to be discharged to:: Private residence Living Arrangements: Alone Available Help at Discharge: Family;Available PRN/intermittently (daughter reports working on setting up 24/7 assist.) Type of Home: House Home Access: Stairs to enter Technical brewer of Steps: 1 Entrance Stairs-Rails: None Home Layout: One level     Bathroom Shower/Tub: Occupational psychologist: Handicapped height     Home Equipment:  Environmental consultant - 4 wheels;Shower seat;Hand held shower head;Grab bars - tub/shower          Prior Functioning/Environment Level of Independence: Independent with assistive device(s)        Comments: history of R LE sciatica with screws in R knee        OT Problem List: Decreased strength;Decreased range of motion;Decreased activity tolerance;Impaired balance (sitting and/or standing);Decreased safety awareness;Decreased knowledge of use of DME or AE;Decreased knowledge of precautions;Pain   OT Treatment/Interventions: Self-care/ADL training;Therapeutic exercise;Energy conservation;Therapeutic activities;Patient/family education;Balance training;DME and/or AE instruction;Neuromuscular education;Splinting;Cognitive remediation/compensation    OT Goals(Current goals can be found in the care plan section) Acute Rehab OT Goals Patient Stated Goal: To be independent as possible OT Goal Formulation: With patient Time For Goal Achievement: 08/28/16 Potential to Achieve Goals: Good ADL Goals Pt Will Perform Eating: with adaptive utensils;sitting;with min assist (with elbow supported) Pt Will Perform Grooming: standing;with min guard assist Pt Will Perform Upper Body Bathing: with min guard assist;sitting Pt Will Perform Lower Body Bathing: with min assist;sit to/from stand Pt Will Perform Upper Body Dressing: with min guard assist;sitting Pt Will Perform Lower Body Dressing: with min assist;sit to/from stand Pt Will Transfer to Toilet: with min assist;ambulating;bedside commode (over toilet) Pt Will Perform Toileting - Clothing Manipulation and hygiene: with min assist;sit to/from stand Additional ADL Goal #1: Pt will incorporate L UE as functional assist during grooming tasks with min guard assist while sitting at EOB as precursor to ADL independence.  OT Frequency: Min 3X/week   Barriers to D/C:            Co-evaluation PT/OT/SLP Co-Evaluation/Treatment: Yes Reason for Co-Treatment: For  patient/therapist safety PT goals addressed during session: Mobility/safety with mobility;Balance OT goals addressed during session:  ADL's and self-care      End of Session Equipment Utilized During Treatment: Gait belt;Rolling walker  Activity Tolerance: Patient tolerated treatment well Patient left: in chair;with call bell/phone within reach;with chair alarm set;with family/visitor present   Time: [id number] OT Time Calculation (min): 34 min Charges:  OT General Charges $OT Visit: 1 Procedure OT Evaluation $OT Eval Moderate Complexity: 1 Procedure  Norman Herrlich, OTR/L [phone] 08/21/2016, 3:55 PM

## 2016-08-21 NOTE — Evaluation (Signed)
Physical Therapy Evaluation Patient Details Name: Yvonne Parrish MRN: [id number] DOB: 12-16-1928 Today's Date: 08/21/2016   History of Present Illness  83 y.o. female with medical history significant for hypertension, coronary artery disease status post CABG in 1995 with stents, dyslipidemia who presented with right-sided weakness and dysphagia.  MRI brain / MRA brain - Acute nonhemorrhagic left paracentral pontine infarct. Remote tiny left caudate head infarct. Remote tiny right cerebellar infarct. Mild chronic microvascular changes. C3-4 protrusion with moderate spinal stenosis with mild to moderate cord flattening.  Clinical Impression  Patient presents with decreased independence with mobility due to deficits listed in PT problem list.  She will benefit from skilled PT in the acute setting to address issues, mainly R LE weakness, decreased sensation and R side awareness, to allow return home with family support following CIR level rehab.     Follow Up Recommendations CIR    Equipment Recommendations  None recommended by PT    Recommendations for Other Services       Precautions / Restrictions Precautions Precautions: Fall      Mobility  Bed Mobility Overal bed mobility: Needs Assistance Bed Mobility: Supine to Sit     Supine to sit: HOB elevated;Min assist     General bed mobility comments: for R LE off EOB, increased time and effort to scoot to EOB  Transfers Overall transfer level: Needs assistance Equipment used: Rolling walker (2 wheeled) Transfers: Sit to/from Stand Sit to Stand: Mod assist;+2 physical assistance         General transfer comment: lifting assist from EOB, increased time and assist for hand placement on walker  Ambulation/Gait Ambulation/Gait assistance: Mod assist;+2 physical assistance Ambulation Distance (Feet): 2 Feet Assistive device: Rolling walker (2 wheeled) Gait Pattern/deviations: Step-to pattern;Decreased step length -  right;Shuffle;Decreased dorsiflexion - right     General Gait Details: assist to progress R LE, difficulty progressing L due to R knee buckling; after couple of steps unable to continue due to weakness so brought chair up behind pt  Stairs            Wheelchair Mobility    Modified Rankin (Stroke Patients Only) Modified Rankin (Stroke Patients Only) Pre-Morbid Rankin Score: Slight disability Modified Rankin: Moderately severe disability     Balance Overall balance assessment: Needs assistance   Sitting balance-Leahy Scale: Fair     Standing balance support: Bilateral upper extremity supported Standing balance-Leahy Scale: Poor Standing balance comment: UE support and assist for balance                             Pertinent Vitals/Pain Pain Assessment: Faces Faces Pain Scale: Hurts a little bit Pain Location: R side Pain Descriptors / Indicators: Discomfort Pain Intervention(s): Repositioned;Monitored during session    Home Living Family/patient expects to be discharged to:: Private residence Living Arrangements: Alone Available Help at Discharge: Family;Available PRN/intermittently (daughter reports working on setting up 24/7 assist) Type of Home: House Home Access: Stairs to enter Entrance Stairs-Rails: None Entrance Stairs-Number of Steps: 1 Home Layout: One level Home Equipment: Environmental consultant - 4 wheels;Shower seat;Hand held shower head;Grab bars - tub/shower      Prior Function Level of Independence: Independent with assistive device(s)         Comments: history of R LE sciatica with screws in R knee     Hand Dominance        Extremity/Trunk Assessment   Upper Extremity Assessment Upper Extremity Assessment: Defer to  OT evaluation    Lower Extremity Assessment Lower Extremity Assessment: RLE deficits/detail RLE Deficits / Details: history of foot drop from sciatica previously; notes had some numbness, but now more, strength hip flexion   RLE Sensation: decreased light touch    Cervical / Trunk Assessment Cervical / Trunk Assessment: Kyphotic  Communication   Communication: Expressive difficulties  Cognition Arousal/Alertness: Awake/alert Behavior During Therapy: WFL for tasks assessed/performed Overall Cognitive Status: Impaired/Different from baseline Area of Impairment: Attention;Orientation;Awareness Orientation Level: Disoriented to;Time Current Attention Level: Sustained       Awareness: Intellectual   General Comments: some decreased R side awareness    General Comments General comments (skin integrity, edema, etc.): daughter in room and receptive to inpatient rehab, reports will get with her sister to plan assist once d/c home.  Educated daughter about PT POC and curent diet recommendations per SLP.    Exercises     Assessment/Plan    PT Assessment Patient needs continued PT services  PT Problem List Decreased strength;Decreased balance;Decreased knowledge of use of DME;Impaired sensation;Decreased mobility;Decreased safety awareness          PT Treatment Interventions DME instruction;Balance training;Gait training;Therapeutic exercise;Patient/family education;Functional mobility training;Therapeutic activities;Neuromuscular re-education    PT Goals (Current goals can be found in the Care Plan section)  Acute Rehab PT Goals Patient Stated Goal: To be independent as possible PT Goal Formulation: With patient/family Time For Goal Achievement: 08/28/16 Potential to Achieve Goals: Good    Frequency Min 4X/week   Barriers to discharge        Co-evaluation PT/OT/SLP Co-Evaluation/Treatment: Yes Reason for Co-Treatment: For patient/therapist safety PT goals addressed during session: Mobility/safety with mobility;Balance         End of Session Equipment Utilized During Treatment: Gait belt Activity Tolerance: Patient tolerated treatment well Patient left: in chair;with call bell/phone  within reach;with chair alarm set;with family/visitor present           Time: [id number] PT Time Calculation (min) (ACUTE ONLY): 28 min   Charges:   PT Evaluation $PT Eval Moderate Complexity: 1 Procedure     PT G CodesReginia Naas 19-Sep-2016, 1:40 PM  Magda Kiel, [place] 09/19/16

## 2016-08-21 NOTE — Evaluation (Signed)
Clinical/Bedside Swallow Evaluation Patient Details  Name: Yvonne Parrish MRN: [id number] Date of Birth: 01-Sep-1928  Today's Date: 08/21/2016 Time: SLP Start Time (ACUTE ONLY): 1042 SLP Stop Time (ACUTE ONLY): 1120 SLP Time Calculation (min) (ACUTE ONLY): 38 min  Past Medical History:  Past Medical History:  Diagnosis Date  . Blood dyscrasia   . CHF (congestive heart failure) ([place])   . COPD (chronic obstructive pulmonary disease) ([place])   . Heart murmur   . Hypertension   . Hypothyroidism    Past Surgical History:  Past Surgical History:  Procedure Laterality Date  . CORONARY ANGIOPLASTY    . CORONARY ARTERY BYPASS GRAFT  1999  . KNEE ARTHROSCOPY     HPI:  Yvonne R Jonesis a 83 y.o.femalｅwith medical history significant for COPD, HTN, CAD s/p CABG in 1995,with stents, TIA symptoms 6 months ago, HLD, systolic CHF presenting with right sided weakness and dysphasia. MRI acute nonhemorrhagic left paracentral pontine infarct. Remote tiny left caudate head infarct. Remote tiny right cerebellar infarct. Mild chronic microvascular changes. CXR no acute abnormality seen. MD note the patient also with C3-C4 protrusion with moderate spinal stenosis with mild to moderate cord flattening.   Assessment / Plan / Recommendation Clinical Impression  Delayed cough following thin via straw/cup, with chin tuck and nectar thick. Suspect delayed swallow initiation, 2 swallows. Indications of aspiration not detected following honey thick. Mild pocketing in non affected left buccal cavity. Objective evaluatio necessary to fully assess within increased risk given pons CVA. Plan for MBS next date and recommend initation of Dys 3, honey thick, no straw, sit upright.     Aspiration Risk  Moderate aspiration risk    Diet Recommendation Dysphagia 3 (Mech soft);Honey-thick liquid   Liquid Administration via: Cup;No straw Medication Administration: Crushed with puree Supervision: Patient able to self  feed;Intermittent supervision to cue for compensatory strategies Compensations: Slow rate;Small sips/bites;Lingual sweep for clearance of pocketing Postural Changes: Seated upright at 90 degrees    Other  Recommendations Oral Care Recommendations: Oral care BID   Follow up Recommendations  (TBD)      Frequency and Duration            Prognosis        Swallow Study   General HPI: Yvonne R Jonesis a 83 y.o.femalｅwith medical history significant for COPD, HTN, CAD s/p CABG in 1995,with stents, TIA symptoms 6 months ago, HLD, systolic CHF presenting with right sided weakness and dysphasia. MRI acute nonhemorrhagic left paracentral pontine infarct. Remote tiny left caudate head infarct. Remote tiny right cerebellar infarct. Mild chronic microvascular changes. CXR no acute abnormality seen. MD note the patient also with C3-C4 protrusion with moderate spinal stenosis with mild to moderate cord flattening. Type of Study: Bedside Swallow Evaluation Previous Swallow Assessment:  (none) Diet Prior to this Study: NPO Temperature Spikes Noted: No Respiratory Status: Room air History of Recent Intubation: No Behavior/Cognition: Alert;Cooperative;Pleasant mood Oral Cavity Assessment: Within Functional Limits Oral Care Completed by SLP: No Oral Cavity - Dentition: Adequate natural dentition Vision: Functional for self-feeding Self-Feeding Abilities: Able to feed self;Needs set up Patient Positioning: Upright in bed Baseline Vocal Quality: Low vocal intensity Volitional Cough: Strong Volitional Swallow: Able to elicit    Oral/Motor/Sensory Function Overall Oral Motor/Sensory Function: Mild impairment Facial ROM: Reduced right;Suspected CN VII (facial) dysfunction Facial Symmetry: Abnormal symmetry right;Suspected CN VII (facial) dysfunction Facial Strength: Reduced right;Suspected CN VII (facial) dysfunction Facial Sensation: Within Functional Limits Lingual ROM: Reduced right;Suspected  CN XII (hypoglossal) dysfunction Lingual  Symmetry: Within Functional Limits Velum: Within Functional Limits Mandible: Within Functional Limits   Ice Chips Ice chips: Not tested   Thin Liquid Thin Liquid: Impaired Presentation: Cup;Straw Pharyngeal  Phase Impairments: Multiple swallows;Suspected delayed Swallow;Cough - Delayed;Throat Clearing - Delayed (watery red eyes)    Nectar Thick Nectar Thick Liquid: Impaired Presentation: Cup Pharyngeal Phase Impairments: Cough - Delayed;Suspected delayed Swallow;Multiple swallows   Honey Thick Honey Thick Liquid: Impaired Presentation: Cup Pharyngeal Phase Impairments: Suspected delayed Swallow   Puree Puree: Impaired Pharyngeal Phase Impairments: Suspected delayed Swallow   Solid   GO   Solid: Within functional limits        Houston Siren 08/21/2016,11:33 AM  Orbie Pyo Colvin Caroli.[employer] [phone]

## 2016-08-22 ENCOUNTER — Inpatient Hospital Stay (HOSPITAL_COMMUNITY): Payer: Medicare Other

## 2016-08-22 ENCOUNTER — Encounter (HOSPITAL_COMMUNITY): Payer: Self-pay | Admitting: Physical Medicine and Rehabilitation

## 2016-08-22 ENCOUNTER — Inpatient Hospital Stay (HOSPITAL_COMMUNITY)
Admission: RE | Admit: 2016-08-22 | Discharge: 2016-09-07 | DRG: 092 | Disposition: A | Discharge status: Home Health | Payer: Medicare Other | Source: Intra-hospital | Attending: Physical Medicine & Rehabilitation | Admitting: Physical Medicine & Rehabilitation

## 2016-08-22 DIAGNOSIS — Z951 Presence of aortocoronary bypass graft: Secondary | ICD-10-CM

## 2016-08-22 DIAGNOSIS — Z7982 Long term (current) use of aspirin: Secondary | ICD-10-CM

## 2016-08-22 DIAGNOSIS — E876 Hypokalemia: Secondary | ICD-10-CM | POA: Diagnosis present

## 2016-08-22 DIAGNOSIS — I2510 Atherosclerotic heart disease of native coronary artery without angina pectoris: Secondary | ICD-10-CM | POA: Diagnosis present

## 2016-08-22 DIAGNOSIS — R269 Unspecified abnormalities of gait and mobility: Secondary | ICD-10-CM

## 2016-08-22 DIAGNOSIS — J849 Interstitial pulmonary disease, unspecified: Secondary | ICD-10-CM | POA: Diagnosis present

## 2016-08-22 DIAGNOSIS — I69351 Hemiplegia and hemiparesis following cerebral infarction affecting right dominant side: Secondary | ICD-10-CM | POA: Diagnosis not present

## 2016-08-22 DIAGNOSIS — I6350 Cerebral infarction due to unspecified occlusion or stenosis of unspecified cerebral artery: Secondary | ICD-10-CM | POA: Diagnosis present

## 2016-08-22 DIAGNOSIS — R2689 Other abnormalities of gait and mobility: Principal | ICD-10-CM | POA: Diagnosis present

## 2016-08-22 DIAGNOSIS — I69398 Other sequelae of cerebral infarction: Secondary | ICD-10-CM

## 2016-08-22 DIAGNOSIS — M21371 Foot drop, right foot: Secondary | ICD-10-CM | POA: Diagnosis present

## 2016-08-22 DIAGNOSIS — K219 Gastro-esophageal reflux disease without esophagitis: Secondary | ICD-10-CM | POA: Diagnosis present

## 2016-08-22 DIAGNOSIS — M48061 Spinal stenosis, lumbar region without neurogenic claudication: Secondary | ICD-10-CM | POA: Diagnosis present

## 2016-08-22 DIAGNOSIS — G8929 Other chronic pain: Secondary | ICD-10-CM | POA: Diagnosis present

## 2016-08-22 DIAGNOSIS — Z823 Family history of stroke: Secondary | ICD-10-CM | POA: Diagnosis not present

## 2016-08-22 DIAGNOSIS — E039 Hypothyroidism, unspecified: Secondary | ICD-10-CM | POA: Diagnosis present

## 2016-08-22 DIAGNOSIS — Z8249 Family history of ischemic heart disease and other diseases of the circulatory system: Secondary | ICD-10-CM | POA: Diagnosis not present

## 2016-08-22 DIAGNOSIS — D469 Myelodysplastic syndrome, unspecified: Secondary | ICD-10-CM | POA: Diagnosis present

## 2016-08-22 DIAGNOSIS — D62 Acute posthemorrhagic anemia: Secondary | ICD-10-CM | POA: Diagnosis present

## 2016-08-22 DIAGNOSIS — I69391 Dysphagia following cerebral infarction: Secondary | ICD-10-CM | POA: Diagnosis not present

## 2016-08-22 DIAGNOSIS — I69320 Aphasia following cerebral infarction: Secondary | ICD-10-CM | POA: Diagnosis not present

## 2016-08-22 DIAGNOSIS — Z79899 Other long term (current) drug therapy: Secondary | ICD-10-CM

## 2016-08-22 DIAGNOSIS — I10 Essential (primary) hypertension: Secondary | ICD-10-CM

## 2016-08-22 DIAGNOSIS — K5909 Other constipation: Secondary | ICD-10-CM | POA: Diagnosis present

## 2016-08-22 DIAGNOSIS — G479 Sleep disorder, unspecified: Secondary | ICD-10-CM | POA: Diagnosis present

## 2016-08-22 DIAGNOSIS — M549 Dorsalgia, unspecified: Secondary | ICD-10-CM | POA: Diagnosis present

## 2016-08-22 DIAGNOSIS — I110 Hypertensive heart disease with heart failure: Secondary | ICD-10-CM | POA: Diagnosis present

## 2016-08-22 DIAGNOSIS — I69322 Dysarthria following cerebral infarction: Secondary | ICD-10-CM | POA: Diagnosis not present

## 2016-08-22 DIAGNOSIS — G8191 Hemiplegia, unspecified affecting right dominant side: Secondary | ICD-10-CM | POA: Diagnosis not present

## 2016-08-22 DIAGNOSIS — J449 Chronic obstructive pulmonary disease, unspecified: Secondary | ICD-10-CM | POA: Diagnosis present

## 2016-08-22 DIAGNOSIS — R1311 Dysphagia, oral phase: Secondary | ICD-10-CM | POA: Diagnosis present

## 2016-08-22 DIAGNOSIS — I5022 Chronic systolic (congestive) heart failure: Secondary | ICD-10-CM

## 2016-08-22 DIAGNOSIS — I639 Cerebral infarction, unspecified: Secondary | ICD-10-CM

## 2016-08-22 LAB — HEMOGLOBIN A1C
Hgb A1c MFr Bld: 6 % — ABNORMAL HIGH (ref 4.8–5.6)
Mean Plasma Glucose: 126 mg/dL

## 2016-08-22 MED ORDER — LISINOPRIL 40 MG PO TABS
40.0000 mg | ORAL_TABLET | Freq: Every day | ORAL | Status: DC
  Administered 2016-08-23 – 2016-09-07 (×15): 40 mg via ORAL
  Filled 2016-08-22 (×16): qty 1

## 2016-08-22 MED ORDER — PROCHLORPERAZINE 25 MG RE SUPP: 12.5000 mg | Freq: Four times a day (QID) | RECTAL | Status: DC | PRN

## 2016-08-22 MED ORDER — PROCHLORPERAZINE MALEATE 5 MG PO TABS: 5.0000 mg | ORAL_TABLET | Freq: Four times a day (QID) | ORAL | Status: DC | PRN

## 2016-08-22 MED ORDER — ACETAMINOPHEN 325 MG PO TABS: 650.0000 mg | ORAL_TABLET | ORAL | 0 refills | Status: DC | PRN

## 2016-08-22 MED ORDER — ACETAMINOPHEN 325 MG PO TABS: 325.0000 mg | ORAL_TABLET | ORAL | Status: DC | PRN

## 2016-08-22 MED ORDER — SIMVASTATIN 40 MG PO TABS
40.0000 mg | ORAL_TABLET | Freq: Every day | ORAL | Status: DC
  Administered 2016-08-23 – 2016-08-24 (×2): 40 mg via ORAL
  Filled 2016-08-22 (×2): qty 1

## 2016-08-22 MED ORDER — ASPIRIN 81 MG PO CHEW
81.0000 mg | CHEWABLE_TABLET | Freq: Every day | ORAL | Status: AC
  Administered 2016-08-23 – 2016-09-04 (×13): 81 mg via ORAL
  Filled 2016-08-22 (×13): qty 1

## 2016-08-22 MED ORDER — ENOXAPARIN SODIUM 40 MG/0.4ML ~~LOC~~ SOLN
40.0000 mg | SUBCUTANEOUS | Status: DC
  Administered 2016-08-23 – 2016-09-07 (×16): 40 mg via SUBCUTANEOUS
  Filled 2016-08-22 (×17): qty 0.4

## 2016-08-22 MED ORDER — ACETAMINOPHEN 160 MG/5ML PO SOLN: 650.0000 mg | ORAL | Status: DC | PRN

## 2016-08-22 MED ORDER — BISACODYL 10 MG RE SUPP
10.0000 mg | Freq: Every day | RECTAL | Status: DC | PRN
Start: 2016-08-22 — End: 2016-09-07

## 2016-08-22 MED ORDER — GUAIFENESIN-DM 100-10 MG/5ML PO SYRP
5.0000 mL | ORAL_SOLUTION | Freq: Four times a day (QID) | ORAL | Status: DC | PRN
  Filled 2016-08-22: qty 10

## 2016-08-22 MED ORDER — LEVOTHYROXINE SODIUM 88 MCG PO TABS
88.0000 ug | ORAL_TABLET | Freq: Every day | ORAL | Status: DC
  Administered 2016-08-23 – 2016-09-07 (×16): 88 ug via ORAL
  Filled 2016-08-22 (×16): qty 1

## 2016-08-22 MED ORDER — BOOST / RESOURCE BREEZE PO LIQD
1.0000 | Freq: Three times a day (TID) | ORAL | Status: DC
  Administered 2016-08-22 – 2016-08-27 (×9): 1 via ORAL

## 2016-08-22 MED ORDER — VITAMIN D 1000 UNITS PO TABS
1000.0000 [IU] | ORAL_TABLET | Freq: Every day | ORAL | Status: DC
  Administered 2016-08-23 – 2016-09-07 (×16): 1000 [IU] via ORAL
  Filled 2016-08-22 (×16): qty 1

## 2016-08-22 MED ORDER — ASPIRIN-DIPYRIDAMOLE ER 25-200 MG PO CP12
1.0000 | ORAL_CAPSULE | Freq: Every day | ORAL | Status: AC
  Administered 2016-08-22 – 2016-09-03 (×13): 1 via ORAL
  Filled 2016-08-22 (×14): qty 1

## 2016-08-22 MED ORDER — ASPIRIN-DIPYRIDAMOLE ER 25-200 MG PO CP12: 1.0000 | ORAL_CAPSULE | Freq: Two times a day (BID) | ORAL | 0 refills | Status: DC

## 2016-08-22 MED ORDER — TRAZODONE HCL 50 MG PO TABS: 25.0000 mg | ORAL_TABLET | Freq: Every evening | ORAL | Status: DC | PRN

## 2016-08-22 MED ORDER — LIDOCAINE 5 % EX PTCH
1.0000 | MEDICATED_PATCH | CUTANEOUS | Status: DC
  Administered 2016-08-22 – 2016-09-02 (×12): 1 via TRANSDERMAL
  Filled 2016-08-22 (×14): qty 1

## 2016-08-22 MED ORDER — ALBUTEROL SULFATE HFA 108 (90 BASE) MCG/ACT IN AERS: 1.0000 | INHALATION_SPRAY | RESPIRATORY_TRACT | Status: DC | PRN

## 2016-08-22 MED ORDER — METOPROLOL SUCCINATE ER 50 MG PO TB24
50.0000 mg | ORAL_TABLET | Freq: Every day | ORAL | Status: DC
  Administered 2016-08-23 – 2016-09-07 (×16): 50 mg via ORAL
  Filled 2016-08-22 (×16): qty 1

## 2016-08-22 MED ORDER — MUSCLE RUB 10-15 % EX CREA
TOPICAL_CREAM | Freq: Two times a day (BID) | CUTANEOUS | Status: DC
  Administered 2016-08-22: 22:00:00 via TOPICAL
  Administered 2016-08-23: 1 via TOPICAL
  Administered 2016-08-23 – 2016-08-26 (×4): via TOPICAL
  Administered 2016-08-26: 1 via TOPICAL
  Administered 2016-08-27 – 2016-09-03 (×8): via TOPICAL
  Filled 2016-08-22: qty 85

## 2016-08-22 MED ORDER — FAMOTIDINE 20 MG PO TABS: 20.0000 mg | ORAL_TABLET | Freq: Two times a day (BID) | ORAL | Status: DC

## 2016-08-22 MED ORDER — PROCHLORPERAZINE EDISYLATE 5 MG/ML IJ SOLN: 5.0000 mg | Freq: Four times a day (QID) | INTRAMUSCULAR | Status: DC | PRN

## 2016-08-22 MED ORDER — ACETAMINOPHEN 325 MG PO TABS
650.0000 mg | ORAL_TABLET | ORAL | Status: DC | PRN
  Administered 2016-08-25 – 2016-09-06 (×13): 650 mg via ORAL
  Filled 2016-08-22 (×12): qty 2

## 2016-08-22 MED ORDER — TROLAMINE SALICYLATE 10 % EX CREA
TOPICAL_CREAM | Freq: Two times a day (BID) | CUTANEOUS | Status: DC
  Filled 2016-08-22: qty 85

## 2016-08-22 MED ORDER — DIPHENHYDRAMINE HCL 12.5 MG/5ML PO ELIX: 12.5000 mg | ORAL_SOLUTION | Freq: Four times a day (QID) | ORAL | Status: DC | PRN

## 2016-08-22 MED ORDER — LORATADINE 10 MG PO TABS
10.0000 mg | ORAL_TABLET | Freq: Every day | ORAL | Status: DC
  Administered 2016-08-23 – 2016-09-07 (×16): 10 mg via ORAL
  Filled 2016-08-22 (×16): qty 1

## 2016-08-22 MED ORDER — ACETAMINOPHEN 325 MG PO TABS
650.0000 mg | ORAL_TABLET | Freq: Every day | ORAL | Status: AC
  Administered 2016-08-23 – 2016-09-03 (×12): 650 mg via ORAL
  Filled 2016-08-22 (×16): qty 2

## 2016-08-22 MED ORDER — POLYETHYLENE GLYCOL 3350 17 G PO PACK
17.0000 g | PACK | Freq: Every day | ORAL | Status: DC
  Administered 2016-08-22 – 2016-09-07 (×16): 17 g via ORAL
  Filled 2016-08-22 (×17): qty 1

## 2016-08-22 MED ORDER — ASPIRIN-DIPYRIDAMOLE ER 25-200 MG PO CP12
1.0000 | ORAL_CAPSULE | Freq: Two times a day (BID) | ORAL | Status: DC
  Administered 2016-09-04 – 2016-09-07 (×6): 1 via ORAL
  Filled 2016-08-22 (×6): qty 1

## 2016-08-22 MED ORDER — ACETAMINOPHEN 650 MG RE SUPP: 650.0000 mg | RECTAL | Status: DC | PRN

## 2016-08-22 MED ORDER — FLEET ENEMA 7-19 GM/118ML RE ENEM: 1.0000 | ENEMA | Freq: Once | RECTAL | Status: DC | PRN

## 2016-08-22 MED ORDER — AZELASTINE HCL 0.1 % NA SOLN
2.0000 | Freq: Every day | NASAL | Status: DC | PRN
  Filled 2016-08-22: qty 30

## 2016-08-22 MED ORDER — CYCLOBENZAPRINE HCL 5 MG PO TABS
5.0000 mg | ORAL_TABLET | Freq: Three times a day (TID) | ORAL | Status: DC | PRN
  Administered 2016-08-22 – 2016-09-05 (×5): 5 mg via ORAL
  Filled 2016-08-22 (×5): qty 1

## 2016-08-22 MED ORDER — ALBUTEROL SULFATE (2.5 MG/3ML) 0.083% IN NEBU: 3.0000 mL | INHALATION_SOLUTION | RESPIRATORY_TRACT | Status: DC | PRN

## 2016-08-22 MED ORDER — ASPIRIN-DIPYRIDAMOLE ER 25-200 MG PO CP12: 1.0000 | ORAL_CAPSULE | Freq: Every day | ORAL | 0 refills | Status: DC

## 2016-08-22 MED ORDER — FAMOTIDINE 20 MG PO TABS
20.0000 mg | ORAL_TABLET | Freq: Every day | ORAL | Status: DC
  Administered 2016-08-23 – 2016-09-07 (×16): 20 mg via ORAL
  Filled 2016-08-22 (×16): qty 1

## 2016-08-22 MED ORDER — ALUM & MAG HYDROXIDE-SIMETH 200-200-20 MG/5ML PO SUSP: 30.0000 mL | ORAL | Status: DC | PRN

## 2016-08-22 NOTE — H&P (Signed)
Physical Medicine and Rehabilitation Admission H&P       Chief Complaint  Patient presents with  . Aphasia  . right sided weakness    HPI:  Yvonne R Jonesis a 83 y.o. RH female with history of HTN, interstitial lung disease, CAD s/p CABG, chronic systolic CHF,  Chronic back pain/spasma, TIA last summer,  who was admitted on 08/21/15 with right sided weakness and difficulty speaking. CT perfusion scan negative. CTA head/neck revealed 3.9 cm ascending thoracic aorta, moderate narrowing of right SA (complex plaque) and R-VA and moderate narrowing of proximal R-PCA.  MRI brain done revealing small left paracentral pontine, C3-C4 protrusion with moderate stenosis and cord flattening as well as intracranial atherosclerotic changes involving posterior circulation. Carotid dopplers without significant ICA stenosis. Cardiac echo with EF 55-60%, severely calcified mitral and aortic valve, moderate AVR and moderate pulmonary HTN.   Dr. Leonie Man felt that infarct secondary to small vessel disease.  Patient declined Plavix and was started on Aggrenox for secondary stroke prevention. MBS done today and diet advanced to regular textures, thin liquids. Patient with resultant right sided weakness with sensory deficits and right knee instability, posterior lean, dysphagia and expressive deficits.  CIR recommended for follow up therapy.    Daughters note chronic right foot drop but unclear if this was associated with knee fracture or sciatica.  MRI shows severe R L5- S1 stenosis      Review of Systems  HENT: Negative for ear pain, hearing loss and tinnitus.   Respiratory: Negative for cough and shortness of breath.   Cardiovascular: Negative for chest pain and palpitations.  Gastrointestinal: Positive for constipation (uses miralax at home), heartburn and vomiting (frequently with meals--has to eat small protions).  Genitourinary: Negative for dysuria and urgency.  Musculoskeletal: Positive for back  pain (chronic ), joint pain (painful right knee X 30 years--due to Fx/displaced pins?Marland Kitchen ) and myalgias.  Skin: Negative for itching and rash.  Neurological: Positive for speech change, focal weakness and weakness. Negative for dizziness.  Psychiatric/Behavioral: Positive for memory loss. The patient has insomnia (poor sleep at nights--sleeps on and off during the day).           Past Medical History:  Diagnosis Date  . Asthma in child    question of interstitial lung disease  . CHF (congestive heart failure) ([place])   . Heart murmur   . Hypertension   . Hypothyroidism   . Myelodysplastic syndrome [facility])          Past Surgical History:  Procedure Laterality Date  . CORONARY ANGIOPLASTY    . CORONARY ARTERY BYPASS GRAFT  1999  . KNEE ARTHROSCOPY    . LUMBAR SPINE SURGERY    . TONSILLECTOMY           Family History  Problem Relation Age of Onset  . Stroke Mother   . Heart disease Father     Social History:  Lives alone and independent PTA--managed home. Has used a walker for the past 30 years due to right knee fracture. Daughters helped with groceries and hel reports that she has never smoked. She has never used smokeless tobacco. She reports that she does not drink alcohol or use drugs.    Allergies: No Known Allergies          Medications Prior to Admission  Medication Sig Dispense Refill  . albuterol (PROVENTIL HFA;VENTOLIN HFA) 108 (90 Base) MCG/ACT inhaler Inhale 1-2 puffs into the lungs every 4 (four) hours as needed for wheezing.    Marland Kitchen  aspirin EC 81 MG tablet Take 81 mg by mouth daily.    Marland Kitchen azelastine (ASTELIN) 0.1 % nasal spray Place 2 sprays into both nostrils daily as needed for rhinitis or allergies.     . Calcium Carbonate-Vit D-Min (CALTRATE 600+D PLUS PO) Take 1 tablet by mouth 2 (two) times daily.    . Cholecalciferol (VITAMIN D-3) 1000 units CAPS Take 1,000 Units by mouth daily.    . cyclobenzaprine (FLEXERIL) 10 MG  tablet Take 10 mg by mouth 2 (two) times daily. MORNING AND BEDTIME    . docusate sodium (COLACE) 100 MG capsule Take 100 mg by mouth 2 (two) times daily as needed for mild constipation.    Marland Kitchen levothyroxine (SYNTHROID, LEVOTHROID) 88 MCG tablet Take 88 mcg by mouth daily before breakfast.    . lisinopril (PRINIVIL,ZESTRIL) 40 MG tablet Take 40 mg by mouth daily.    Marland Kitchen loratadine (CLARITIN) 10 MG tablet Take 10 mg by mouth daily.    . metoprolol succinate (TOPROL-XL) 50 MG 24 hr tablet Take 50 mg by mouth daily.    . naproxen sodium (ANAPROX) 220 MG tablet Take 1-2 tablets by mouth 2 (two) times daily as needed for pain.    . polyethylene glycol powder (GLYCOLAX/MIRALAX) powder Take 17 g by mouth daily.    . pseudoephedrine-guaifenesin (MUCINEX D) 60-600 MG 12 hr tablet Take 1 tablet by mouth at bedtime.    . ranitidine (ZANTAC) 150 MG tablet Take 150 mg by mouth 2 (two) times daily.    . simvastatin (ZOCOR) 40 MG tablet Take 40 mg by mouth at bedtime.      Home: Home Living Family/patient expects to be discharged to:: Private residence Living Arrangements: Alone Available Help at Discharge: Family, Available PRN/intermittently (daughter reports working on setting up 24/7 assist.) Type of Home: House Home Access: Stairs to enter Technical brewer of Steps: 1 Entrance Stairs-Rails: None Home Layout: One level Bathroom Shower/Tub: Multimedia programmer: Handicapped height [place]: Environmental consultant - 4 wheels, Shower seat, Hand held shower head, Grab bars - tub/shower   Functional History: Prior Function Level of Independence: Independent with assistive device(s) Comments: history of R LE sciatica with screws in R knee  Functional Status:  Mobility: Bed Mobility Overal bed mobility: Needs Assistance Bed Mobility: Supine to Sit Supine to sit: HOB elevated, Min assist General bed mobility comments: for R LE off EOB, increased time and effort to scoot  to EOB Transfers Overall transfer level: Needs assistance Equipment used: Rolling walker (2 wheeled) Transfers: Sit to/from Stand Sit to Stand: Mod assist, +2 physical assistance General transfer comment: lifting assist from EOB, increased time and assist for hand placement on walker Ambulation/Gait Ambulation/Gait assistance: Mod assist, +2 physical assistance Ambulation Distance (Feet): 2 Feet Assistive device: Rolling walker (2 wheeled) Gait Pattern/deviations: Step-to pattern, Decreased step length - right, Shuffle, Decreased dorsiflexion - right General Gait Details: assist to progress R LE, difficulty progressing L due to R knee buckling; after couple of steps unable to continue due to weakness so brought chair up behind pt    ADL: ADL Overall ADL's : Needs assistance/impaired Eating/Feeding: Supervision/ safety, Sitting Grooming: Sitting, Moderate assistance (for bilateral tasks) Upper Body Bathing: Moderate assistance, Sitting Lower Body Bathing: Maximal assistance, Sit to/from stand Upper Body Dressing : Moderate assistance, Sitting Lower Body Dressing: Maximal assistance, Sit to/from stand Toilet Transfer: Moderate assistance, +2 for physical assistance, +2 for safety/equipment, RW Toileting- Clothing Manipulation and Hygiene: Maximal assistance, Sitting/lateral lean Functional mobility during ADLs: Moderate  assistance, +2 for physical assistance, +2 for safety/equipment, Rolling walker General ADL Comments: Pt and daughter educated on techniques to improve safety with self-feeding tasks.  Cognition: Cognition Overall Cognitive Status: Impaired/Different from baseline Orientation Level: Oriented to person, Oriented to place Cognition Arousal/Alertness: Awake/alert Behavior During Therapy: WFL for tasks assessed/performed Overall Cognitive Status: Impaired/Different from baseline Area of Impairment: Attention, Orientation, Awareness Orientation Level: Disoriented to,  Time Current Attention Level: Sustained Awareness: Intellectual General Comments: Pt reporting the year as 2020. some decreased R side awareness    Blood pressure (!) 189/62, pulse 74, temperature 97.9 F (36.6 C), temperature source Oral, resp. rate 20, height 5' 1.5" (1.562 m), weight 67.2 kg (148 lb 1.6 oz), SpO2 97 %. Physical Exam  Nursing note and vitals reviewed. Constitutional: She appears well-developed and well-nourished.  HENT:  Head: Normocephalic and atraumatic.  Mouth/Throat: Oropharynx is clear and moist.  Eyes: Conjunctivae are normal. Pupils are equal, round, and reactive to light.  Crusty drainage inner cantus  Neck: Normal range of motion. Neck supple.  Cardiovascular: Normal rate and regular rhythm.   Respiratory: Effort normal. She has no wheezes. She has rhonchi. She exhibits no tenderness.  GI: Soft. Bowel sounds are normal. She exhibits no distension. There is no tenderness.  Musculoskeletal:  Right knee pain with minimal ROM. Tolerates slow ROM Hypersensitive to touch medially.   Neurological: She is alert. A cranial nerve deficit is present.  Right facial weakness -- Speech soft with mild ataxic dysarthria.  Expressive deficits noted. Oriented to self only. Got more disoriented as exam progressed. Place "[place]" or "[place]"--she knew that she lived in [place] but very surprised that she was in [facility] hospital for stroke management. Right hemiparesis with mild inattention. Able to follow simple one step commands--broke down with two step.   MMT 2- R delt , bi, tri, grip, 4- HF, KE , 2- ADF  Skin: Skin is warm and dry.    Lab Results Last 48 Hours        Results for orders placed or performed during the hospital encounter of 08/20/16 (from the past 48 hour(s))  Protime-INR     Status: None   Collection Time: 08/20/16  5:41 PM  Result Value Ref Range   Prothrombin Time 13.4 11.4 - 15.2 seconds   INR 1.01   APTT     Status: None   Collection Time:  08/20/16  5:41 PM  Result Value Ref Range   aPTT 30 24 - 36 seconds  Troponin I     Status: None   Collection Time: 08/20/16  5:41 PM  Result Value Ref Range   Troponin I <0.03 <0.03 ng/mL  Culture, blood (routine x 2)     Status: None (Preliminary result)   Collection Time: 08/20/16  5:41 PM  Result Value Ref Range   Specimen Description BLOOD RIGHT HAND    Special Requests IN PEDIATRIC BOTTLE 3CC    Culture NO GROWTH 2 DAYS    Report Status PENDING   Culture, blood (routine x 2)     Status: None (Preliminary result)   Collection Time: 08/20/16  5:41 PM  Result Value Ref Range   Specimen Description BLOOD LEFT HAND    Special Requests IN PEDIATRIC BOTTLE 2CC    Culture NO GROWTH 2 DAYS    Report Status PENDING   Hemoglobin A1c     Status: Abnormal   Collection Time: 08/21/16  6:28 AM  Result Value Ref Range   Hgb A1c MFr Bld  6.0 (H) 4.8 - 5.6 %    Comment: (NOTE)         Pre-diabetes: 5.7 - 6.4         Diabetes: >6.4         Glycemic control for adults with diabetes: <7.0   Mean Plasma Glucose 126 mg/dL    Comment: (NOTE) Performed At: [facility] [place], [place] [id number] Lindon Romp MD [id number]  Lipid panel     Status: None   Collection Time: 08/21/16  6:28 AM  Result Value Ref Range   Cholesterol 168 0 - 200 mg/dL   Triglycerides 87 <150 mg/dL   HDL 57 >40 mg/dL   Total CHOL/HDL Ratio 2.9 RATIO   VLDL 17 0 - 40 mg/dL   LDL Cholesterol 94 0 - 99 mg/dL    Comment:        Total Cholesterol/HDL:CHD Risk Coronary Heart Disease Risk Table                     Men   Women  1/2 Average Risk   3.4   3.3  Average Risk       5.0   4.4  2 X Average Risk   9.6   7.1  3 X Average Risk  23.4   11.0        Use the calculated Patient Ratio above and the CHD Risk Table to determine the patient's CHD Risk.        ATP III CLASSIFICATION (LDL):  <100     mg/dL   Optimal  100-129  mg/dL   Near or Above                     Optimal  130-159  mg/dL   Borderline  160-189  mg/dL   High  >190     mg/dL   Very High       Imaging Results (Last 48 hours)  Mr Brain Wo Contrast  Result Date: 08/20/2016 EXAM: MRI HEAD WITHOUT CONTRAST MRA HEAD WITHOUT CONTRAST TECHNIQUE: Multiplanar, multiecho pulse sequences of the brain and surrounding structures were obtained without intravenous contrast. Angiographic images of the head were obtained using MRA technique without contrast. COMPARISON:  CT 08/20/2016. FINDINGS: MRI HEAD FINDINGS Brain: Acute nonhemorrhagic left paracentral pontine infarct. Remote tiny left caudate head infarct. Remote tiny right cerebellar infarct. Mild chronic microvascular changes. Mild global atrophy without hydrocephalus. Prominent choroid cysts incidentally noted. No intracranial mass lesion noted on this unenhanced exam. Vascular: As below. Skull and upper cervical spine: C3-4 protrusion with moderate spinal stenosis with mild to moderate cord flattening. Sinuses/Orbits: Post lens replacement without acute abnormality. Visualized sinuses are clear. Other: Negative MRA HEAD FINDINGS Mild to slightly moderate narrowing distal vertebral arteries bilaterally. Mild narrowing mid basilar artery. Only proximal aspect of the right posterior inferior cerebellar artery is well delineated with narrowed vessel beyond this region. Mild to moderate narrowing proximal left posterior inferior cerebellar artery. Nonvisualized anterior inferior cerebellar artery bilaterally. Marked narrowing proximal right posterior cerebral artery with moderate to marked narrowing distal right posterior cerebral artery branches. Moderate tandem stenosis mid aspect left posterior cerebral artery. Mild to moderate tandem stenosis right superior cerebellar artery. Moderate narrowing distal M1 segment left middle cerebral artery. Ectatic slightly irregular cavernous segment internal carotid artery bilaterally without significant  stenosis. Fetal contribution to the right posterior cerebral artery. IMPRESSION: MRI HEAD Acute nonhemorrhagic left paracentral pontine infarct. Remote tiny left caudate head  infarct. Remote tiny right cerebellar infarct. Mild chronic microvascular changes. C3-4 protrusion with moderate spinal stenosis with mild to moderate cord flattening. MRA HEAD Intracranial atherosclerotic changes more notable involving posterior circulation as detailed above. These results were called by telephone at the time of interpretation on 08/20/2016 at 4:24 pm to Dr. Sharene Butters , who verbally acknowledged these results. Electronically Signed   By: Genia Del M.D.   On: 08/20/2016 17:08   Dg Swallowing Func-speech Pathology  Result Date: 08/22/2016 Objective Swallowing Evaluation: Type of Study: MBS-Modified Barium Swallow Study Patient Details Name: FAUSTINE FERRERI MRN: [id number] Date of Birth: April 20, 1929 Today's Date: 08/22/2016 Time: SLP Start Time (ACUTE ONLY): 1300-SLP Stop Time (ACUTE ONLY): 1315 SLP Time Calculation (min) (ACUTE ONLY): 15 min Past Medical History: Past Medical History: Diagnosis Date . Blood dyscrasia  . CHF (congestive heart failure) ([place])  . COPD (chronic obstructive pulmonary disease) ([place])  . Heart murmur  . Hypertension  . Hypothyroidism  Past Surgical History: Past Surgical History: Procedure Laterality Date . CORONARY ANGIOPLASTY   . CORONARY ARTERY BYPASS GRAFT  1999 . KNEE ARTHROSCOPY   . LUMBAR SPINE SURGERY   . TONSILLECTOMY   HPI: Yvonne R Jonesis a 83 y.o.femalewith medical history significant for COPD, HTN, CAD s/p CABG in 1995,with stents, TIA symptoms 6 months ago, HLD, systolic CHF presenting with right sided weakness and dysphasia. MRI acute nonhemorrhagic left paracentral pontine infarct. Remote tiny left caudate head infarct. Remote tiny right cerebellar infarct. Mild chronic microvascular changes. CXR no acute abnormality seen. MD note the patient also with C3-C4 protrusion with  moderate spinal stenosis with mild to moderate cord flattening. No Data Recorded Assessment / Plan / Recommendation CHL IP CLINICAL IMPRESSIONS 08/22/2016 Therapy Diagnosis Mild oral phase dysphagia;Mild pharyngeal phase dysphagia Clinical Impression Pt exhibits min-mild oral and pharygneal dysphagia marked by minimally prolonged mastication, delayed transit and decreased oral cohesion (resulting in premature spill x 1 to pyriform sinuses). Flash laryngeal penetration of thin during pill trial also caused by reduced oral control/cohesion with mixed consistencies. Regular texture and thin liquids recommended, straws allowed, pills whole in applesauce. No further ST needed.  Impact on safety and function Mild aspiration risk   CHL IP TREATMENT RECOMMENDATION 08/22/2016 Treatment Recommendations No treatment recommended at this time   No flowsheet data found. CHL IP DIET RECOMMENDATION 08/22/2016 SLP Diet Recommendations Regular solids;Thin liquid Liquid Administration via Cup;Straw Medication Administration Whole meds with puree Compensations Slow rate;Small sips/bites Postural Changes Seated upright at 90 degrees   CHL IP OTHER RECOMMENDATIONS 08/22/2016 Recommended Consults -- Oral Care Recommendations Oral care BID Other Recommendations --   CHL IP FOLLOW UP RECOMMENDATIONS 08/22/2016 Follow up Recommendations None   No flowsheet data found.     CHL IP ORAL PHASE 08/22/2016 Oral Phase Impaired Oral - Pudding Teaspoon -- Oral - Pudding Cup -- Oral - Honey Teaspoon -- Oral - Honey Cup -- Oral - Nectar Teaspoon -- Oral - Nectar Cup -- Oral - Nectar Straw -- Oral - Thin Teaspoon NT Oral - Thin Cup Lingual/palatal residue Oral - Thin Straw Lingual/palatal residue Oral - Puree -- Oral - Mech Soft -- Oral - Regular Delayed oral transit Oral - Multi-Consistency -- Oral - Pill -- Oral Phase - Comment --  CHL IP PHARYNGEAL PHASE 08/22/2016 Pharyngeal Phase Impaired Pharyngeal- Pudding Teaspoon -- Pharyngeal -- Pharyngeal- Pudding  Cup -- Pharyngeal -- Pharyngeal- Honey Teaspoon -- Pharyngeal -- Pharyngeal- Honey Cup -- Pharyngeal -- Pharyngeal- Nectar Teaspoon -- Pharyngeal -- Pharyngeal- Nectar  Cup -- Pharyngeal -- Pharyngeal- Nectar Straw -- Pharyngeal -- Pharyngeal- Thin Teaspoon -- Pharyngeal -- Pharyngeal- Thin Cup Delayed swallow initiation-vallecula Pharyngeal -- Pharyngeal- Thin Straw (No Data) Pharyngeal -- Pharyngeal- Puree -- Pharyngeal -- Pharyngeal- Mechanical Soft -- Pharyngeal -- Pharyngeal- Regular WFL Pharyngeal -- Pharyngeal- Multi-consistency -- Pharyngeal -- Pharyngeal- Pill Penetration/Aspiration during swallow Pharyngeal Material enters airway, remains ABOVE vocal cords then ejected out Pharyngeal Comment --  CHL IP CERVICAL ESOPHAGEAL PHASE 08/22/2016 Cervical Esophageal Phase WFL Pudding Teaspoon -- Pudding Cup -- Honey Teaspoon -- Honey Cup -- Nectar Teaspoon -- Nectar Cup -- Nectar Straw -- Thin Teaspoon -- Thin Cup -- Thin Straw -- Puree -- Mechanical Soft -- Regular -- Multi-consistency -- Pill -- Cervical Esophageal Comment -- No flowsheet data found. [place] Siren 08/22/2016, 2:55 PM Orbie Pyo Colvin Caroli.Ed CCC-SLP Pager [phone]              Mr Jodene Nam Head/brain [id number] Cm  Result Date: 08/20/2016 EXAM: MRI HEAD WITHOUT CONTRAST MRA HEAD WITHOUT CONTRAST TECHNIQUE: Multiplanar, multiecho pulse sequences of the brain and surrounding structures were obtained without intravenous contrast. Angiographic images of the head were obtained using MRA technique without contrast. COMPARISON:  CT 08/20/2016. FINDINGS: MRI HEAD FINDINGS Brain: Acute nonhemorrhagic left paracentral pontine infarct. Remote tiny left caudate head infarct. Remote tiny right cerebellar infarct. Mild chronic microvascular changes. Mild global atrophy without hydrocephalus. Prominent choroid cysts incidentally noted. No intracranial mass lesion noted on this unenhanced exam. Vascular: As below. Skull and upper cervical spine: C3-4 protrusion with  moderate spinal stenosis with mild to moderate cord flattening. Sinuses/Orbits: Post lens replacement without acute abnormality. Visualized sinuses are clear. Other: Negative MRA HEAD FINDINGS Mild to slightly moderate narrowing distal vertebral arteries bilaterally. Mild narrowing mid basilar artery. Only proximal aspect of the right posterior inferior cerebellar artery is well delineated with narrowed vessel beyond this region. Mild to moderate narrowing proximal left posterior inferior cerebellar artery. Nonvisualized anterior inferior cerebellar artery bilaterally. Marked narrowing proximal right posterior cerebral artery with moderate to marked narrowing distal right posterior cerebral artery branches. Moderate tandem stenosis mid aspect left posterior cerebral artery. Mild to moderate tandem stenosis right superior cerebellar artery. Moderate narrowing distal M1 segment left middle cerebral artery. Ectatic slightly irregular cavernous segment internal carotid artery bilaterally without significant stenosis. Fetal contribution to the right posterior cerebral artery. IMPRESSION: MRI HEAD Acute nonhemorrhagic left paracentral pontine infarct. Remote tiny left caudate head infarct. Remote tiny right cerebellar infarct. Mild chronic microvascular changes. C3-4 protrusion with moderate spinal stenosis with mild to moderate cord flattening. MRA HEAD Intracranial atherosclerotic changes more notable involving posterior circulation as detailed above. These results were called by telephone at the time of interpretation on 08/20/2016 at 4:24 pm to Dr. Sharene Butters , who verbally acknowledged these results. Electronically Signed   By: Genia Del M.D.   On: 08/20/2016 17:08        Medical Problem List and Plan: 1.  Right hemiparesis secondary to Left paramedian pontine infarct 2.  DVT Prophylaxis/Anticoagulation: Pharmaceutical: Lovenox 3. Chronic back pain/spasms/Pain Management: Continue flexeril prn 4.  Mood: Stable. LCSW to follow for evaluation and support.  5. Neuropsych: This patient is not capable of making decisions on her own behalf. 6. Skin/Wound Care: Routine pressure relief measures. Maintain adequate nutrition and hydration status.  7. Fluids/Electrolytes/Nutrition: Monitor I/O--diet advanced to regular today. Will check lytes in am.  8. HTN: Monitor BP bid--continue lisinopril  9. 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  10. H/o asthma/ Interstitial lung disease: Monitor respiratory status with increase in activity. albuterol MDI prn 11 Chronic systolic CHF: On ASA, metoprolol and zocor. Monitor for signs of overload--check weights daily and add Heart healthy/Low salt restrictions.  Discontinue IVF to avoid fluid overload.  12. Chronic constipation: will resume miralax.  13. GERD: Wth chronic nausea--continue pepcid for now. Small portions at meals. May need PPI.    Post Admission Physician Evaluation: 1. Functional deficits secondary  to Right hemiparesis complicated by chronic right foot drop. 2. Patient is admitted to receive collaborative, interdisciplinary care between the physiatrist, rehab nursing staff, and therapy team. 3. Patient's level of medical complexity and substantial therapy needs in context of that medical necessity cannot be provided at a lesser intensity of care such as a SNF. 4. Patient has experienced substantial functional loss from his/her baseline which was documented above under the "Functional History" and "Functional Status" headings.  Judging by the patient's diagnosis, physical exam, and functional history, the patient has potential for functional progress which will result in measurable gains while on inpatient rehab.  These gains will be of substantial and practical use upon discharge  in facilitating mobility and self-care at the household level. 5. Physiatrist will provide 24 hour management of medical needs as well as oversight of  the therapy plan/treatment and provide guidance as appropriate regarding the interaction of the two. 6. The Preadmission Screening has been reviewed and patient status is unchanged unless otherwise stated above. 7. 24 hour rehab nursing will assist with bladder management, bowel management, safety, skin/wound care, disease management, medication administration, pain management and patient education  and help integrate therapy concepts, techniques,education, etc. 8. PT will assess and treat for/with: pre gait, gait training, endurance , safety, equipment, neuromuscular re education.   Goals are: Sup/MinA. 9. OT will assess and treat for/with: ADLs, Cognitive perceptual skills, Neuromuscular re education, safety, endurance, equipment.   Goals are: Sup/MinA. Therapy may proceed with showering this patient. 10. SLP will assess and treat for/with: cognition, swallowing, dysarthria.  Goals are: swallow normal diet, 100% speech intell, sup med management. 11. Case Management and Social Worker will assess and treat for psychological issues and discharge planning. 12. Team conference will be held weekly to assess progress toward goals and to determine barriers to discharge. 13. Patient will receive at least 3 hours of therapy per day at least 5 days per week. 14. ELOS: 15-19d 15. Prognosis:  good     Charlett Blake M.D. [facility] FAAPM&R (Sports Med, Neuromuscular Med) Diplomate Am Board of Electrodiagnostic Med  Flora Lipps 08/22/2016

## 2016-08-22 NOTE — PMR Pre-admission (Signed)
PMR Admission Coordinator Pre-Admission Assessment  Patient: Yvonne Parrish is an 83 y.o., female MRN: [id number] DOB: Jun 08, 1929 Height: 5' 1.5" (156.2 cm) Weight: 67.2 kg (148 lb 1.6 oz)              Insurance Information HMO:     PPO: X     PCP:      IPA:      80/20:      OTHER:  PRIMARY: UHC Medicare       Policy#: [id number]      Subscriber: Self CM Name: Sherlynn Stalls       Phone#: [phone]     Fax#: [phone] Pre-Cert#: [id number]      Employer:  Benefits:  Phone #: [phone]     Name: verified online  Eff. 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 RH female with history of HTN, interstitial lung disease, CAD s/p CABG, chronic systolic CHF, chronic back pain/spasma, TIA last summer, who was admitted on 08/21/15 with right sided weakness and difficulty speaking. CT perfusion scan negative. CTA head/neck revealed 3.9 cm ascending thoracic aorta, moderate narrowing of right SA (complex plaque) and R-VA and moderate narrowing of proximal R-PCA. MRI brain done revealing small left paracentral pontine, C3-C4 protrusion with moderate stenosis and cord flattening as well as intracranial atherosclerotic changes involving posterior circulation. Carotid dopplers without significant ICA stenosis. Cardiac echo with EF 55-60%, severely  calcified mitral and aortic valve, moderate AVR and moderate pulmonary HTN. Dr. Leonie Man felt that infarct secondary to small vessel disease.  Patient declined Plavix and was started on Aggrenox for secondary stroke prevention. Patient with resultant right sided weakness with sensory deficits and right knee instability, posterior lean, dysphagia, and expressive deficits. MBS done 08/22/16 recommending regular textures and thin liquids. CIR recommended for follow up therapy and admitted 08/22/16.    NIH Total: 9    Past Medical History  Past Medical History:  Diagnosis Date  . Blood dyscrasia   . CHF (congestive heart failure) ([place])   . COPD (chronic obstructive pulmonary disease) ([place])   . Heart murmur   . Hypertension   . Hypothyroidism     Family History  family history includes Heart disease in her father; Stroke in her mother.  Prior Rehab/Hospitalizations:  Has the patient had major surgery during 100 days prior to admission? No  Current Medications   Current Facility-Administered Medications:  .  0.9 %  sodium chloride infusion, , Intravenous, Continuous, Rondel Jumbo, PA-C, Last Rate: 50 mL/hr at 08/20/16 1848 .  acetaminophen (TYLENOL) tablet 650 mg, 650 mg, Oral, Q4H PRN, 650 mg at 08/22/16  0040 **OR** acetaminophen (TYLENOL) solution 650 mg, 650 mg, Per Tube, Q4H PRN **OR** acetaminophen (TYLENOL) suppository 650 mg, 650 mg, Rectal, Q4H PRN, Rondel Jumbo, PA-C .  albuterol (PROVENTIL HFA;VENTOLIN HFA) 108 (90 Base) MCG/ACT inhaler 1-2 puff, 1-2 puff, Inhalation, Q4H PRN, Rondel Jumbo, PA-C .  aspirin chewable tablet 81 mg, 81 mg, Oral, Daily, Donzetta Starch, NP, 81 mg at 08/22/16 0929 .  dipyridamole-aspirin (AGGRENOX) 200-25 MG per 12 hr capsule 1 capsule, 1 capsule, Oral, QHS, 1 capsule at 08/21/16 2150 **FOLLOWED BY** [START ON 09/04/2016] dipyridamole-aspirin (AGGRENOX) 200-25 MG per 12 hr capsule 1 capsule, 1 capsule, Oral, BID, Donzetta Starch, NP .  famotidine (PEPCID)  tablet 20 mg, 20 mg, Oral, BID, Rondel Jumbo, PA-C, 20 mg at 08/22/16 [id number] .  heparin injection 5,000 Units, 5,000 Units, Subcutaneous, Q8H, Rondel Jumbo, PA-C, 5,000 Units at 08/22/16 1431 .  hydrALAZINE (APRESOLINE) injection 5-10 mg, 5-10 mg, Intravenous, Q8H PRN, Rondel Jumbo, PA-C .  levothyroxine (SYNTHROID, LEVOTHROID) tablet 88 mcg, 88 mcg, Oral, QAC breakfast, Rondel Jumbo, PA-C, 88 mcg at 08/22/16 0800 .  lisinopril (PRINIVIL,ZESTRIL) tablet 40 mg, 40 mg, Oral, Daily, Rondel Jumbo, PA-C, 40 mg at 08/22/16 0929 .  metoprolol succinate (TOPROL-XL) 24 hr tablet 50 mg, 50 mg, Oral, Daily, Rondel Jumbo, PA-C, 50 mg at 08/22/16 0929 .  senna-docusate (Senokot-S) tablet 1 tablet, 1 tablet, Oral, QHS PRN, Rondel Jumbo, PA-C .  simvastatin (ZOCOR) tablet 40 mg, 40 mg, Oral, q1800, Rondel Jumbo, PA-C, 40 mg at 08/21/16 1804  Patients Current Diet: Diet regular Room service appropriate? Yes; Fluid consistency: Thin  Precautions / Restrictions Precautions Precautions: Fall Restrictions Weight Bearing Restrictions: No   Has the patient had 2 or more falls or a fall with injury in the past year?No  Prior Activity Level Household: Prior to admission patient lived alone at home and daughter assisted with grocery shopping and outside of the house tasks.  Patient went out 1-2 times a month for medical appointments, but would walk to the mailbox daily to check mail.    Home Assistive Devices / Equipment Home Equipment: Walker - 4 wheels, Shower seat, Hand held shower head, Grab bars - tub/shower  Prior Device Use: Indicate devices/aids used by the patient prior to current illness, exacerbation or injury? Walker  Prior Functional Level Prior Function Level of Independence: Independent with assistive device(s) Comments: history of R LE sciatica with screws in R knee  Self Care: Did the patient need help bathing, dressing, using the toilet or eating? Independent  Indoor  Mobility: Did the patient need assistance with walking from room to room (with or without device)? Independent  Stairs: Did the patient need assistance with internal or external stairs (with or without device)? Independent  Functional Cognition: Did the patient need help planning regular tasks such as shopping or remembering to take medications? Independent  Current Functional Level Cognition  Overall Cognitive Status: Impaired/Different from baseline Current Attention Level: Sustained Orientation Level: Oriented to person, Oriented to place General Comments: Pt reporting the year as 2020. some decreased R side awareness    Extremity Assessment (includes Sensation/Coordination)  Upper Extremity Assessment: RUE deficits/detail, LUE deficits/detail RUE Deficits / Details: Pt demonstrates decreased strength and AROM of RUE grossly. Brunstrom level IV R hand and arm. Pt demonstrates some finger isolation and nearly complete composite grasp. Some extensor lag noted with digit extension. Pt motivated to use R hand for functional tasks as she  is R handed. Able to initiate hand to mouth movements but unable to complete full ROM. RUE Sensation: decreased light touch, decreased proprioception RUE Coordination: decreased fine motor, decreased gross motor LUE Deficits / Details: Generalized weakness  Lower Extremity Assessment: Defer to PT evaluation RLE Deficits / Details: history of foot drop from sciatica previously; notes had some numbness, but now more, strength hip flexion  RLE Sensation: decreased light touch    ADLs  Overall ADL's : Needs assistance/impaired Eating/Feeding: Supervision/ safety, Sitting Grooming: Sitting, Moderate assistance (for bilateral tasks) Upper Body Bathing: Moderate assistance, Sitting Lower Body Bathing: Maximal assistance, Sit to/from stand Upper Body Dressing : Moderate assistance, Sitting Lower Body Dressing: Maximal assistance, Sit to/from stand Toilet  Transfer: Moderate assistance, +2 for physical assistance, +2 for safety/equipment, RW Toileting- Clothing Manipulation and Hygiene: Maximal assistance, Sitting/lateral lean Functional mobility during ADLs: Moderate assistance, +2 for physical assistance, +2 for safety/equipment, Rolling walker General ADL Comments: Pt and daughter educated on techniques to improve safety with self-feeding tasks.    Mobility  Overal bed mobility: Needs Assistance Bed Mobility: Supine to Sit Supine to sit: HOB elevated, Min assist General bed mobility comments: for R LE off EOB, increased time and effort to scoot to EOB    Transfers  Overall transfer level: Needs assistance Equipment used: Rolling walker (2 wheeled) Transfers: Sit to/from Stand Sit to Stand: Mod assist, +2 physical assistance General transfer comment: lifting assist from EOB, increased time and assist for hand placement on walker    Ambulation / Gait / Stairs / Wheelchair Mobility  Ambulation/Gait Ambulation/Gait assistance: Mod assist, +2 physical assistance Ambulation Distance (Feet): 2 Feet Assistive device: Rolling walker (2 wheeled) Gait Pattern/deviations: Step-to pattern, Decreased step length - right, Shuffle, Decreased dorsiflexion - right General Gait Details: assist to progress R LE, difficulty progressing L due to R knee buckling; after couple of steps unable to continue due to weakness so brought chair up behind pt    Posture / Balance Balance Overall balance assessment: Needs assistance Sitting-balance support: No upper extremity supported, Feet supported Sitting balance-Leahy Scale: Fair Postural control: Posterior lean Standing balance support: Bilateral upper extremity supported Standing balance-Leahy Scale: Poor Standing balance comment: UE support and assist for balance    Special needs/care consideration BiPAP/CPAP: No CPM: No Continuous Drip IV: No Dialysis: No         Life Vest: No Oxygen: No Special Bed:  No Trach Size: No Wound Vac (area): No       Skin: WDL                                Bowel mgmt: PTA  Bladder mgmt: Continent  Diabetic mgmt: No     Previous Home Environment Living Arrangements: Alone Available Help at Discharge: Family, Available PRN/intermittently (daughter reports working on setting up 24/7 assist.) Type of Home: House Home Layout: One level Home Access: Stairs to enter Entrance Stairs-Rails: None Technical brewer of Steps: 1 Bathroom Shower/Tub: Multimedia programmer: Handicapped height Home Care Services: No  Discharge Living Setting Plans for Discharge Living Setting: Patient's home, Alone Type of Home at Discharge: House Discharge Home Layout: One level Discharge Home Access: Stairs to enter Entrance Stairs-Rails: Right Entrance Stairs-Number of Steps: 2 Discharge Bathroom Shower/Tub: Walk-in shower (ledge and shower chair ) Discharge Bathroom Toilet: Handicapped height Discharge Bathroom Accessibility: No Does the patient have any problems obtaining your medications?: No  Social/Family/Support Systems Patient  Roles: Parent (grandparent ) Contact Information: Daughters Anticipated Caregiver: America Brown [phone] Anticipated Caregiver's Contact Information: Elie Confer [phone] Ability/Limitations of Caregiver: Daughters will take turns and hire for 24/7  Caregiver Availability: 24/7 Discharge Plan Discussed with Primary Caregiver: Yes Is Caregiver In Agreement with Plan?: Yes Does Caregiver/Family have Issues with Lodging/Transportation while Pt is in Rehab?: No  Goals/Additional Needs Patient/Family Goal for Rehab: OT: Supervision-Min A; PT: Supervision; SLP: Mod I  Expected length of stay: 13-18 days  Cultural Considerations: None Dietary Needs: Upgraded to regular and thin 08/22/16 Equipment Needs: TBD Special Service Needs: None Additional Information: Patient has a stuffed cat "Sonia Baller" that "breathes" when you  pet it.  It comforts her Pt/Family Agrees to Admission and willing to participate: Yes Program Orientation Provided & Reviewed with Pt/Caregiver Including Roles  & Responsibilities: Yes Additional Information Needs: If daughter decide to hire home help for their mom please provide resources  Information Needs to be Provided By: CSW   Decrease burden of Care through IP rehab admission: No  Possible need for SNF placement upon discharge: No  Patient Condition: This patient's condition remains as documented in the consult dated 08/21/16, in which the Rehabilitation Physician determined and documented that the patient's condition is appropriate for intensive rehabilitative care in an inpatient rehabilitation facility. Will admit to inpatient rehab today.   Preadmission Screen Completed By:  Gunnar Fusi, 08/22/2016 3:54 PM ______________________________________________________________________   Discussed status with Dr. Letta Pate on 08/22/16 at 17 and received telephone approval for admission today.  Admission Coordinator:  Gunnar Fusi, time 1600/Date 08/22/16

## 2016-08-22 NOTE — Progress Notes (Signed)
Physical Therapy Treatment Patient Details Name: Yvonne Parrish MRN: [id number] DOB: 1929-08-10 Today's Date: 08/22/2016    History of Present Illness 83 y.o. female with medical history significant for hypertension, coronary artery disease status post CABG in 1995 with stents, dyslipidemia who presented with right-sided weakness and dysphagia.  MRI brain / MRA brain - Acute nonhemorrhagic left paracentral pontine infarct. Remote tiny left caudate head infarct. Remote tiny right cerebellar infarct. Mild chronic microvascular changes. C3-4 protrusion with moderate spinal stenosis with mild to moderate cord flattening.    PT Comments    Pt able to ambulate more steps today, but limited by fatigue; required max A +2 for facilitation of R UE/LE during gait.  Patient pleased with progress after completing treatment today. Patient to d/c to CIR this afternoon.    Follow Up Recommendations  CIR     Equipment Recommendations  None recommended by PT    Recommendations for Other Services       Precautions / Restrictions Precautions Precautions: Fall Restrictions Weight Bearing Restrictions: No    Mobility  Bed Mobility                  Transfers Overall transfer level: Needs assistance Equipment used: Rolling walker (2 wheeled) Transfers: Sit to/from Stand Sit to Stand: Mod assist;+2 physical assistance         General transfer comment: Pt required v/c for hand placement on chair for effective sit to stand. Mod A +2 for safety due to weakness in R LE and trunk.   Ambulation/Gait Ambulation/Gait assistance: +2 physical assistance;Max assist Ambulation Distance (Feet): 6 Feet Assistive device: Rolling walker (2 wheeled) ( )     Gait velocity interpretation: Below normal speed for age/gender General Gait Details: PTA facilitation during ambulation of R hand grip, R elbow extension, R knee extension and R foot clearance during swing phase, including weight shifting towards  L side.   Stairs            Wheelchair Mobility    Modified Rankin (Stroke Patients Only) Modified Rankin (Stroke Patients Only) Pre-Morbid Rankin Score: Slight disability Modified Rankin: Moderately severe disability     Balance Overall balance assessment: Needs assistance Sitting-balance support: Feet supported;Single extremity supported Sitting balance-Leahy Scale: Fair Sitting balance - Comments: required v/c for anterior trunk lean. Postural control: Posterior lean Standing balance support: Bilateral upper extremity supported Standing balance-Leahy Scale: Poor Standing balance comment: UE support and mod A+2 for balance                    Cognition Arousal/Alertness: Awake/alert Behavior During Therapy: WFL for tasks assessed/performed Overall Cognitive Status: Impaired/Different from baseline Area of Impairment: Attention;Awareness               General Comments: RUE unawareness during transfers and repositioning.     Exercises General Exercises - Lower Extremity Long Arc Quad: AROM;Both;10 reps;Seated Hip ABduction/ADduction: AAROM;Right;5 reps;AROM;Left;10 reps;Seated (required AAROM x5 reps on R due to R LE weakness) Hip Flexion/Marching: PROM;Right;10 reps;AROM;Left;Seated (Pt unable to perfrom R AROM hip flexion) Toe Raises: PROM;Right;10 reps;AROM;Left;Seated (Pt unable to perform AROM on R) Heel Raises: AROM;Both;10 reps Mini-Sqauts: Seated    General Comments        Pertinent Vitals/Pain Pain Assessment: No/denies pain    Home Living                      Prior Function  PT Goals (current goals can now be found in the care plan section) Acute Rehab PT Goals Patient Stated Goal: Patient ready to CIR PT Goal Formulation: With patient/family Potential to Achieve Goals: Good Progress towards PT goals: Progressing toward goals    Frequency    Min 4X/week      PT Plan Current plan remains appropriate     Co-evaluation             End of Session Equipment Utilized During Treatment: Gait belt Activity Tolerance: Patient limited by fatigue;Patient tolerated treatment well;No increased pain Patient left: in chair;with call bell/phone within reach;with chair alarm set;with family/visitor present     Time: [id number] PT Time Calculation (min) (ACUTE ONLY): 32 min  Charges:  $Therapeutic Exercise: 8-22 mins $Therapeutic Activity: 8-22 mins                    G Codes:      [facility] 09/16/2016, 5:49 PM  [place], [place] Pager [phone]

## 2016-08-22 NOTE — Progress Notes (Signed)
Inpatient Rehabilitation  I have a bed to offer and insurance authorization for today.  I await acute medical clearance prior to hopeful admission.  Please call with questions.  Carmelia Roller., CCC/SLP Admission Coordinator  [place]  Cell [phone]

## 2016-08-22 NOTE — Progress Notes (Signed)
Given report from [place], patient admitted onto CIR with daughter by bedside. Admission assessment preformed with assistance of [place], Therapist, sports. Patient noted to have a stage I of sacrum with MASD to inner buttocks epbc applied. Heels boggy and elevated on pillows. Report given to on-coming nurse. [place]

## 2016-08-22 NOTE — Progress Notes (Signed)
Modified Barium Swallow Progress Note  Patient Details  Name: Yvonne Parrish MRN: [id number] Date of Birth: [birth year]  Today's Date: 08/22/2016  Modified Barium Swallow completed.  Full report located under Chart Review in the Imaging Section.  Brief recommendations include the following:  Clinical Impression  Pt exhibits min-mild oral and pharygneal dysphagia marked by minimally prolonged mastication, delayed transit and decreased oral cohesion (resulting in premature spill x 1 to pyriform sinuses). Flash laryngeal penetration of thin during pill trial also caused by reduced oral control/cohesion with mixed consistencies. Regular texture and thin liquids recommended, straws allowed, pills whole in applesauce. No further ST needed.    Swallow Evaluation Recommendations       SLP Diet Recommendations: Regular solids;Thin liquid   Liquid Administration via: Cup;Straw   Medication Administration: Whole meds with puree   Supervision: Patient able to self feed;Intermittent supervision to cue for compensatory strategies   Compensations: Slow rate;Small sips/bites   Postural Changes: Seated upright at 90 degrees   Oral Care Recommendations: Oral care BID        Houston Siren 08/22/2016,2:55 PM  Orbie Pyo [place] [phone]

## 2016-08-22 NOTE — Discharge Summary (Signed)
Physician Discharge Summary  TAWNA BURTNETT [id number] DOB: 15-Jul-1929 DOA: 08/20/2016  PCP: Tracie Harrier, MD  Admit date: 08/20/2016 Discharge date: 08/22/2016  Recommendations for Outpatient Follow-up:  D/C to inpatient rehab Continue aggrenox once a t bedtime through 09/05/2016 Then start aggrenox twice a day from 09/06/2106  Discharge Diagnoses:  Active Problems:   COPD exacerbation (HCC)   Aphasia   Acquired hypothyroidism   Asthma without status asthmaticus   Cardiomyopathy, dilated (HCC)   CHF (congestive heart failure) (HCC)   Chronic back pain   History of coronary artery bypass graft   H/O cardiac catheterization   Hyperlipidemia, unspecified   Hypertension   Myelodysplastic syndrome ([place])   Stroke (cerebrum) (HCC)   Slurred speech   Chronic obstructive pulmonary disease (HCC)   Coronary artery disease involving coronary bypass graft of native heart without angina pectoris   Leukocytosis   Hyperglycemia   Right hemiparesis ([place])    Discharge Condition: stable   Diet recommendation: as tolerated   History of present illness:  83 y.o. female with past medical history of HTN, CAD s/p CABG in 1995,with stents, HLD, Systolic CHF who presented to [facility] with right sided weakness. She did not receive IV t-PA due to delay in arrival.   Hospital Course:   Stroke:  L paracentral pontine and L caudate head infarct, felt to be secondary to small vessel disease with residual right hemiparesis  - Aspirin and aggrenox as prescribed, aggrenox at bedtime for 2 weeks and then BID - CTA head and neck - no large infarct, ascending thoracic arota 3.9cm, post CABG grafts occluded, descending thoracic aortic jxn w/ complex plaque w/ sm ulcerations, plaque both CCA, RSCA complex plaque - MRI left paracentral pontine infarct. Tiny left caudate head infarct. Old right cerebellar infarct. C3-4 mod spinal stenosis and cord flattening - MRA  Posterior circulation atherosclerosis - Carotid  Doppler  No significant stenosis  - 2D Echo  EF 55-60%. No source of embolus - LDL 94 - HgbA1c 6.0 - Per PT - inpatient rehab   Hypertension, essential  - Continue lisinopril and metoprolol - Continue aggressive blood pressure measurement    Dyslipidemia  - Home meds: zocor 40 mg daily - LDL 94, goal < 70  Hypothyroidism - Continue synthroid    Signed:  Leisa Lenz, MD  [facility] 08/22/2016, 4:15 PM  Pager #: [phone]  Time spent in minutes: less than 30 minutes    Discharge Exam: Vitals:   08/22/16 0913 08/22/16 1418  BP: (!) 159/60 (!) 189/62  Pulse: 73 74  Resp: 20 20  Temp: 97.9 F (36.6 C) 97.9 F (36.6 C)   Vitals:   08/22/16 0128 08/22/16 0510 08/22/16 0913 08/22/16 1418  BP: (!) 168/69 (!) 166/72 (!) 159/60 (!) 189/62  Pulse: 81 78 73 74  Resp: 18 18 20 20   Temp: 98.5 F (36.9 C) 97.9 F (36.6 C) 97.9 F (36.6 C) 97.9 F (36.6 C)  TempSrc: Oral Oral Oral Oral  SpO2: 99% 98% 96% 97%  Weight:      Height:        General: Pt is alert, follows commands appropriately, not in acute distress Cardiovascular: Regular rate and rhythm, S1/S2 +, no murmurs Respiratory: Clear to auscultation bilaterally, no wheezing, no crackles, no rhonchi Abdominal: Soft, non tender, non distended, bowel sounds +, no guarding Extremities: no edema, no cyanosis, pulses palpable bilaterally DP and PT Neuro: right arm weakness, no sensation loss  Discharge Instructions  Discharge Instructions  Ambulatory referral to Neurology    Complete by:  As directed    Stroke patient. Dr. Leonie Man prefers follow up in 6 weeks   Call MD for:  persistant nausea and vomiting    Complete by:  As directed    Call MD for:  redness, tenderness, or signs of infection (pain, swelling, redness, odor or green/yellow discharge around incision site)    Complete by:  As directed    Call MD for:  severe uncontrolled pain    Complete by:  As directed    Diet - low sodium heart  healthy    Complete by:  As directed    Discharge instructions    Complete by:  As directed    D/C to inpatient rehab Continue aggrenox once a t bedtime through 09/05/2016 Then start aggrenox twice a day from 09/06/2106   Increase activity slowly    Complete by:  As directed      Allergies as of 08/22/2016   No Known Allergies     Medication List    STOP taking these medications   naproxen sodium 220 MG tablet Commonly known as:  ANAPROX     TAKE these medications   acetaminophen 325 MG tablet Commonly known as:  TYLENOL Take 2 tablets (650 mg total) by mouth every 4 (four) hours as needed for mild pain (or temp > 37.5 C (99.5 F)).   albuterol 108 (90 Base) MCG/ACT inhaler Commonly known as:  PROVENTIL HFA;VENTOLIN HFA Inhale 1-2 puffs into the lungs every 4 (four) hours as needed for wheezing.   aspirin EC 81 MG tablet Take 81 mg by mouth daily.   ASTELIN 0.1 % nasal spray Generic drug:  azelastine Place 2 sprays into both nostrils daily as needed for rhinitis or allergies.   CALTRATE 600+D PLUS PO Take 1 tablet by mouth 2 (two) times daily.   cyclobenzaprine 10 MG tablet Commonly known as:  FLEXERIL Take 10 mg by mouth 2 (two) times daily. MORNING AND BEDTIME   dipyridamole-aspirin 200-25 MG 12hr capsule Commonly known as:  AGGRENOX Take 1 capsule by mouth at bedtime.   dipyridamole-aspirin 200-25 MG 12hr capsule Commonly known as:  AGGRENOX Take 1 capsule by mouth 2 (two) times daily. Start taking on:  09/06/2016   docusate sodium 100 MG capsule Commonly known as:  COLACE Take 100 mg by mouth 2 (two) times daily as needed for mild constipation.   levothyroxine 88 MCG tablet Commonly known as:  SYNTHROID, LEVOTHROID Take 88 mcg by mouth daily before breakfast.   lisinopril 40 MG tablet Commonly known as:  PRINIVIL,ZESTRIL Take 40 mg by mouth daily.   loratadine 10 MG tablet Commonly known as:  CLARITIN Take 10 mg by mouth daily.   metoprolol  succinate 50 MG 24 hr tablet Commonly known as:  TOPROL-XL Take 50 mg by mouth daily.   polyethylene glycol powder powder Commonly known as:  GLYCOLAX/MIRALAX Take 17 g by mouth daily.   pseudoephedrine-guaifenesin 60-600 MG 12 hr tablet Commonly known as:  MUCINEX D Take 1 tablet by mouth at bedtime.   ranitidine 150 MG tablet Commonly known as:  ZANTAC Take 150 mg by mouth 2 (two) times daily.   simvastatin 40 MG tablet Commonly known as:  ZOCOR Take 40 mg by mouth at bedtime.   Vitamin D-3 1000 units Caps Take 1,000 Units by mouth daily.      Follow-up Information    SETHI,PRAMOD, MD Follow up in 6 week(s).   Specialties:  Neurology, Radiology Why:  stroke clinic. office will call with appt date and time Contact information: [address] [place] [address] [phone]        Tracie Harrier, MD. Schedule an appointment as soon as possible for a visit in 1 week(s).   Specialty:  Internal Medicine Contact information: [address] [place] [address] [phone]            The results of significant diagnostics from this hospitalization (including imaging, microbiology, ancillary and laboratory) are listed below for reference.    Significant Diagnostic Studies: Ct Angio Head W Or Wo Contrast  Result Date: 08/20/2016 CLINICAL DATA:  83 year old hypertensive female with right-sided facial droop and right leg weakness. Last seen normal at midnight. Subsequent encounter. EXAM: CT ANGIOGRAPHY HEAD AND NECK. CT PERFUSION BRAIN TECHNIQUE: Multiphase CT imaging of the brain was performed following IV bolus contrast injection. Subsequent parametric perfusion maps were calculated using RAPID software. Multidetector CT imaging of the head and neck was performed using the standard protocol during bolus administration of intravenous contrast. Multiplanar CT image reconstructions and MIPs were obtained to evaluate the  vascular anatomy. Carotid stenosis measurements (when applicable) are obtained utilizing NASCET criteria, using the distal internal carotid diameter as the denominator. CONTRAST:  100 cc Isovue 370. COMPARISON:  08/20/2016 head CT. FINDINGS: CT Brain Perfusion Findings: CBF (<30%) Volume: 68mL Perfusion (Tmax>6.0s) volume: 43mL Mismatch Volume: 43mL Infarction Location:Not identified CT HEAD FINDINGS Brain: No intracranial hemorrhage or CT evidence of large acute infarct. No intracranial mass or abnormal enhancement. No hydrocephalus. Vascular: As below. Skull: Negative. Sinuses: Minimal ethmoid sinus air cell mucosal thickening. Orbits: Post lens replacement without acute abnormality. Review of the MIP images confirms the above findings CTA NECK FINDINGS Aortic arch: 4 vessel arch with left vertebral artery arising directly from the arch. Mild to slightly moderate narrowing origin of the left vertebral artery and left subclavian artery. Ascending thoracic aorta measures up to 3.9 cm. Post CABG. One of the jumped grafts is occluded. Mild dilation at the arch/proximal descending thoracic aortic junction with prominent complex plaque in small ulcerations. Right carotid system: Plaque and ectasia involving portions of the right common carotid artery and internal carotid artery. Less than 60% diameter narrowing. Left carotid system: Plaque and ectasia involving portions of the left common carotid artery and internal carotid artery. Less than 60% diameter narrowing. Vertebral arteries: Left vertebral artery arises directly from the aortic arch with mild slightly moderate narrowing proximal aspect. Moderate narrowing proximal right subclavian artery with complex plaque. Moderate narrowing origin of the right vertebral artery. Mild narrowing vertebral arteries at the C1-2 level. Skeleton: Cervical spondylotic changes most notable C6-7 followed by the C3-4 level. Remote Schmorl's node deformity and 20% loss of height superior  endplate T5. Left lobe of thyroid gland extends slightly inferiorly but without worrisome mass. Mild symmetric prominence lingual tonsillar tissue with pulled secretions vallecula. Right carotid artery impresses upon the posterior aspect of the pharynx. Upper chest: Scattered small calcified granulomas. Review of the MIP images confirms the above findings CTA HEAD FINDINGS Anterior circulation: Anterior circulation without medium or large size vessel significant stenosis or occlusion. Cavernous segment internal carotid artery calcification ectasia with mild narrowing and dilation without high-grade stenosis. Posterior circulation: Calcified vertebral arteries with mild slightly moderate narrowing at the level the foramen magnum. No significant stenosis of the basilar artery. Moderate narrowing proximal right posterior cerebral artery. Bilateral posterior cerebral artery distal branch vessel mild narrowing and irregularity.  Venous sinuses: Patent. Anatomic variants: Fetal contribution to the right posterior cerebral artery. Delayed phase: As above. Review of the MIP images confirms the above findings IMPRESSION: CT Brain Perfusion CBF (<30%) Volume: 34mL Perfusion (Tmax>6.0s) volume: 34mL Mismatch Volume: 74mL CT HEAD No intracranial hemorrhage or CT evidence of large acute infarct. CTA NECK FINDINGS Ascending thoracic aorta measures up to 3.9 cm. Recommend annual imaging followup by CTA or MRA. This recommendation follows 2010 ACCF/AHA/AATS/ACR/ASA/SCA/SCAI/SIR/STS/SVM Guidelines for the Diagnosis and Management of Patients with Thoracic Aortic Disease. Circulation.2010; [id number] Post CABG. One of the jumped grafts is occluded. Mild dilation at the arch/proximal descending thoracic aortic junction with prominent complex plaque with small ULCERATIONS. Plaque and ectasia involving portions both common carotid arteries and both internal carotid arteries. Less than 60% diameter narrowing. Left vertebral artery arises  directly from the aortic arch with mild to slightly moderate narrowing proximal aspect. Moderate narrowing proximal right subclavian artery with complex plaque. Moderate narrowing origin of the right vertebral artery. Mild narrowing vertebral arteries at the C1-2 level. CTA HEAD Anterior circulation without medium or large size vessel significant stenosis or occlusion. Calcified vertebral arteries with mild to slightly moderate narrowing at the level the foramen magnum. No significant stenosis of the basilar artery. Moderate narrowing proximal right posterior cerebral artery. Bilateral posterior cerebral artery distal branch vessel mild narrowing and irregularity. Electronically Signed   By: Genia Del M.D.   On: 08/20/2016 12:34   Ct Head Wo Contrast  Result Date: 08/20/2016 CLINICAL DATA:  Right-sided weakness, slurred speech. EXAM: CT HEAD WITHOUT CONTRAST TECHNIQUE: Contiguous axial images were obtained from the base of the skull through the vertex without intravenous contrast. COMPARISON:  None. FINDINGS: Brain: Minimal chronic ischemic white matter disease is noted. No mass effect or midline shift is noted. Ventricular size is within normal limits. There is no evidence of mass lesion, hemorrhage or acute infarction. Vascular: Atherosclerosis of carotid siphons is noted. Skull: Normal. Negative for fracture or focal lesion. Sinuses/Orbits: No acute finding. Other: None. IMPRESSION: Minimal chronic ischemic white matter disease. No acute intracranial abnormality seen. Electronically Signed   By: Marijo Conception, M.D.   On: 08/20/2016 10:55   Ct Angio Neck W And/or Wo Contrast  Result Date: 08/20/2016 CLINICAL DATA:  83 year old hypertensive female with right-sided facial droop and right leg weakness. Last seen normal at midnight. Subsequent encounter. EXAM: CT ANGIOGRAPHY HEAD AND NECK. CT PERFUSION BRAIN TECHNIQUE: Multiphase CT imaging of the brain was performed following IV bolus contrast injection.  Subsequent parametric perfusion maps were calculated using RAPID software. Multidetector CT imaging of the head and neck was performed using the standard protocol during bolus administration of intravenous contrast. Multiplanar CT image reconstructions and MIPs were obtained to evaluate the vascular anatomy. Carotid stenosis measurements (when applicable) are obtained utilizing NASCET criteria, using the distal internal carotid diameter as the denominator. CONTRAST:  100 cc Isovue 370. COMPARISON:  08/20/2016 head CT. FINDINGS: CT Brain Perfusion Findings: CBF (<30%) Volume: 25mL Perfusion (Tmax>6.0s) volume: 42mL Mismatch Volume: 79mL Infarction Location:Not identified CT HEAD FINDINGS Brain: No intracranial hemorrhage or CT evidence of large acute infarct. No intracranial mass or abnormal enhancement. No hydrocephalus. Vascular: As below. Skull: Negative. Sinuses: Minimal ethmoid sinus air cell mucosal thickening. Orbits: Post lens replacement without acute abnormality. Review of the MIP images confirms the above findings CTA NECK FINDINGS Aortic arch: 4 vessel arch with left vertebral artery arising directly from the arch. Mild to slightly moderate narrowing origin of the left vertebral  artery and left subclavian artery. Ascending thoracic aorta measures up to 3.9 cm. Post CABG. One of the jumped grafts is occluded. Mild dilation at the arch/proximal descending thoracic aortic junction with prominent complex plaque in small ulcerations. Right carotid system: Plaque and ectasia involving portions of the right common carotid artery and internal carotid artery. Less than 60% diameter narrowing. Left carotid system: Plaque and ectasia involving portions of the left common carotid artery and internal carotid artery. Less than 60% diameter narrowing. Vertebral arteries: Left vertebral artery arises directly from the aortic arch with mild slightly moderate narrowing proximal aspect. Moderate narrowing proximal right  subclavian artery with complex plaque. Moderate narrowing origin of the right vertebral artery. Mild narrowing vertebral arteries at the C1-2 level. Skeleton: Cervical spondylotic changes most notable C6-7 followed by the C3-4 level. Remote Schmorl's node deformity and 20% loss of height superior endplate T5. Left lobe of thyroid gland extends slightly inferiorly but without worrisome mass. Mild symmetric prominence lingual tonsillar tissue with pulled secretions vallecula. Right carotid artery impresses upon the posterior aspect of the pharynx. Upper chest: Scattered small calcified granulomas. Review of the MIP images confirms the above findings CTA HEAD FINDINGS Anterior circulation: Anterior circulation without medium or large size vessel significant stenosis or occlusion. Cavernous segment internal carotid artery calcification ectasia with mild narrowing and dilation without high-grade stenosis. Posterior circulation: Calcified vertebral arteries with mild slightly moderate narrowing at the level the foramen magnum. No significant stenosis of the basilar artery. Moderate narrowing proximal right posterior cerebral artery. Bilateral posterior cerebral artery distal branch vessel mild narrowing and irregularity. Venous sinuses: Patent. Anatomic variants: Fetal contribution to the right posterior cerebral artery. Delayed phase: As above. Review of the MIP images confirms the above findings IMPRESSION: CT Brain Perfusion CBF (<30%) Volume: 25mL Perfusion (Tmax>6.0s) volume: 29mL Mismatch Volume: 21mL CT HEAD No intracranial hemorrhage or CT evidence of large acute infarct. CTA NECK FINDINGS Ascending thoracic aorta measures up to 3.9 cm. Recommend annual imaging followup by CTA or MRA. This recommendation follows 2010 ACCF/AHA/AATS/ACR/ASA/SCA/SCAI/SIR/STS/SVM Guidelines for the Diagnosis and Management of Patients with Thoracic Aortic Disease. Circulation.2010; [id number] Post CABG. One of the jumped grafts is  occluded. Mild dilation at the arch/proximal descending thoracic aortic junction with prominent complex plaque with small ULCERATIONS. Plaque and ectasia involving portions both common carotid arteries and both internal carotid arteries. Less than 60% diameter narrowing. Left vertebral artery arises directly from the aortic arch with mild to slightly moderate narrowing proximal aspect. Moderate narrowing proximal right subclavian artery with complex plaque. Moderate narrowing origin of the right vertebral artery. Mild narrowing vertebral arteries at the C1-2 level. CTA HEAD Anterior circulation without medium or large size vessel significant stenosis or occlusion. Calcified vertebral arteries with mild to slightly moderate narrowing at the level the foramen magnum. No significant stenosis of the basilar artery. Moderate narrowing proximal right posterior cerebral artery. Bilateral posterior cerebral artery distal branch vessel mild narrowing and irregularity. Electronically Signed   By: Genia Del M.D.   On: 08/20/2016 12:34   Mr Brain Wo Contrast  Result Date: 08/20/2016 EXAM: MRI HEAD WITHOUT CONTRAST MRA HEAD WITHOUT CONTRAST TECHNIQUE: Multiplanar, multiecho pulse sequences of the brain and surrounding structures were obtained without intravenous contrast. Angiographic images of the head were obtained using MRA technique without contrast. COMPARISON:  CT 08/20/2016. FINDINGS: MRI HEAD FINDINGS Brain: Acute nonhemorrhagic left paracentral pontine infarct. Remote tiny left caudate head infarct. Remote tiny right cerebellar infarct. Mild chronic microvascular changes. Mild global atrophy  without hydrocephalus. Prominent choroid cysts incidentally noted. No intracranial mass lesion noted on this unenhanced exam. Vascular: As below. Skull and upper cervical spine: C3-4 protrusion with moderate spinal stenosis with mild to moderate cord flattening. Sinuses/Orbits: Post lens replacement without acute abnormality.  Visualized sinuses are clear. Other: Negative MRA HEAD FINDINGS Mild to slightly moderate narrowing distal vertebral arteries bilaterally. Mild narrowing mid basilar artery. Only proximal aspect of the right posterior inferior cerebellar artery is well delineated with narrowed vessel beyond this region. Mild to moderate narrowing proximal left posterior inferior cerebellar artery. Nonvisualized anterior inferior cerebellar artery bilaterally. Marked narrowing proximal right posterior cerebral artery with moderate to marked narrowing distal right posterior cerebral artery branches. Moderate tandem stenosis mid aspect left posterior cerebral artery. Mild to moderate tandem stenosis right superior cerebellar artery. Moderate narrowing distal M1 segment left middle cerebral artery. Ectatic slightly irregular cavernous segment internal carotid artery bilaterally without significant stenosis. Fetal contribution to the right posterior cerebral artery. IMPRESSION: MRI HEAD Acute nonhemorrhagic left paracentral pontine infarct. Remote tiny left caudate head infarct. Remote tiny right cerebellar infarct. Mild chronic microvascular changes. C3-4 protrusion with moderate spinal stenosis with mild to moderate cord flattening. MRA HEAD Intracranial atherosclerotic changes more notable involving posterior circulation as detailed above. These results were called by telephone at the time of interpretation on 08/20/2016 at 4:24 pm to Dr. Sharene Butters , who verbally acknowledged these results. Electronically Signed   By: Genia Del M.D.   On: 08/20/2016 17:08   Ct Cerebral Perfusion W Contrast  Result Date: 08/20/2016 CLINICAL DATA:  83 year old hypertensive female with right-sided facial droop and right leg weakness. Last seen normal at midnight. Subsequent encounter. EXAM: CT ANGIOGRAPHY HEAD AND NECK. CT PERFUSION BRAIN TECHNIQUE: Multiphase CT imaging of the brain was performed following IV bolus contrast injection. Subsequent  parametric perfusion maps were calculated using RAPID software. Multidetector CT imaging of the head and neck was performed using the standard protocol during bolus administration of intravenous contrast. Multiplanar CT image reconstructions and MIPs were obtained to evaluate the vascular anatomy. Carotid stenosis measurements (when applicable) are obtained utilizing NASCET criteria, using the distal internal carotid diameter as the denominator. CONTRAST:  100 cc Isovue 370. COMPARISON:  08/20/2016 head CT. FINDINGS: CT Brain Perfusion Findings: CBF (<30%) Volume: 9mL Perfusion (Tmax>6.0s) volume: 81mL Mismatch Volume: 73mL Infarction Location:Not identified CT HEAD FINDINGS Brain: No intracranial hemorrhage or CT evidence of large acute infarct. No intracranial mass or abnormal enhancement. No hydrocephalus. Vascular: As below. Skull: Negative. Sinuses: Minimal ethmoid sinus air cell mucosal thickening. Orbits: Post lens replacement without acute abnormality. Review of the MIP images confirms the above findings CTA NECK FINDINGS Aortic arch: 4 vessel arch with left vertebral artery arising directly from the arch. Mild to slightly moderate narrowing origin of the left vertebral artery and left subclavian artery. Ascending thoracic aorta measures up to 3.9 cm. Post CABG. One of the jumped grafts is occluded. Mild dilation at the arch/proximal descending thoracic aortic junction with prominent complex plaque in small ulcerations. Right carotid system: Plaque and ectasia involving portions of the right common carotid artery and internal carotid artery. Less than 60% diameter narrowing. Left carotid system: Plaque and ectasia involving portions of the left common carotid artery and internal carotid artery. Less than 60% diameter narrowing. Vertebral arteries: Left vertebral artery arises directly from the aortic arch with mild slightly moderate narrowing proximal aspect. Moderate narrowing proximal right subclavian artery  with complex plaque. Moderate narrowing origin of the right vertebral  artery. Mild narrowing vertebral arteries at the C1-2 level. Skeleton: Cervical spondylotic changes most notable C6-7 followed by the C3-4 level. Remote Schmorl's node deformity and 20% loss of height superior endplate T5. Left lobe of thyroid gland extends slightly inferiorly but without worrisome mass. Mild symmetric prominence lingual tonsillar tissue with pulled secretions vallecula. Right carotid artery impresses upon the posterior aspect of the pharynx. Upper chest: Scattered small calcified granulomas. Review of the MIP images confirms the above findings CTA HEAD FINDINGS Anterior circulation: Anterior circulation without medium or large size vessel significant stenosis or occlusion. Cavernous segment internal carotid artery calcification ectasia with mild narrowing and dilation without high-grade stenosis. Posterior circulation: Calcified vertebral arteries with mild slightly moderate narrowing at the level the foramen magnum. No significant stenosis of the basilar artery. Moderate narrowing proximal right posterior cerebral artery. Bilateral posterior cerebral artery distal branch vessel mild narrowing and irregularity. Venous sinuses: Patent. Anatomic variants: Fetal contribution to the right posterior cerebral artery. Delayed phase: As above. Review of the MIP images confirms the above findings IMPRESSION: CT Brain Perfusion CBF (<30%) Volume: 71mL Perfusion (Tmax>6.0s) volume: 64mL Mismatch Volume: 14mL CT HEAD No intracranial hemorrhage or CT evidence of large acute infarct. CTA NECK FINDINGS Ascending thoracic aorta measures up to 3.9 cm. Recommend annual imaging followup by CTA or MRA. This recommendation follows 2010 ACCF/AHA/AATS/ACR/ASA/SCA/SCAI/SIR/STS/SVM Guidelines for the Diagnosis and Management of Patients with Thoracic Aortic Disease. Circulation.2010; [id number] Post CABG. One of the jumped grafts is occluded. Mild  dilation at the arch/proximal descending thoracic aortic junction with prominent complex plaque with small ULCERATIONS. Plaque and ectasia involving portions both common carotid arteries and both internal carotid arteries. Less than 60% diameter narrowing. Left vertebral artery arises directly from the aortic arch with mild to slightly moderate narrowing proximal aspect. Moderate narrowing proximal right subclavian artery with complex plaque. Moderate narrowing origin of the right vertebral artery. Mild narrowing vertebral arteries at the C1-2 level. CTA HEAD Anterior circulation without medium or large size vessel significant stenosis or occlusion. Calcified vertebral arteries with mild to slightly moderate narrowing at the level the foramen magnum. No significant stenosis of the basilar artery. Moderate narrowing proximal right posterior cerebral artery. Bilateral posterior cerebral artery distal branch vessel mild narrowing and irregularity. Electronically Signed   By: Genia Del M.D.   On: 08/20/2016 12:34   Dg Chest Portable 1 View  Result Date: 08/20/2016 CLINICAL DATA:  Stroke-like symptoms EXAM: PORTABLE CHEST 1 VIEW COMPARISON:  None. FINDINGS: Cardiac shadow is mildly enlarged. Postsurgical changes are noted. The lungs are well aerated bilaterally without focal infiltrate or sizable effusion. No acute bony abnormality is noted. IMPRESSION: No acute abnormality seen. Electronically Signed   By: Inez Catalina M.D.   On: 08/20/2016 12:08   Dg Swallowing Func-speech Pathology  Result Date: 08/22/2016 Objective Swallowing Evaluation: Type of Study: MBS-Modified Barium Swallow Study Patient Details Name: CHEREE CONG MRN: [id number] Date of Birth: 05-31-1929 Today's Date: 08/22/2016 Time: SLP Start Time (ACUTE ONLY): 1300-SLP Stop Time (ACUTE ONLY): 1315 SLP Time Calculation (min) (ACUTE ONLY): 15 min Past Medical History: Past Medical History: Diagnosis Date . Blood dyscrasia  . CHF (congestive heart  failure) ([place])  . COPD (chronic obstructive pulmonary disease) ([place])  . Heart murmur  . Hypertension  . Hypothyroidism  Past Surgical History: Past Surgical History: Procedure Laterality Date . CORONARY ANGIOPLASTY   . CORONARY ARTERY BYPASS GRAFT  1999 . KNEE ARTHROSCOPY   . LUMBAR SPINE SURGERY   . TONSILLECTOMY  HPI: Naylea R Jonesis a 83 y.o.femalewith medical history significant for COPD, HTN, CAD s/p CABG in 1995,with stents, TIA symptoms 6 months ago, HLD, systolic CHF presenting with right sided weakness and dysphasia. MRI acute nonhemorrhagic left paracentral pontine infarct. Remote tiny left caudate head infarct. Remote tiny right cerebellar infarct. Mild chronic microvascular changes. CXR no acute abnormality seen. MD note the patient also with C3-C4 protrusion with moderate spinal stenosis with mild to moderate cord flattening. No Data Recorded Assessment / Plan / Recommendation CHL IP CLINICAL IMPRESSIONS 08/22/2016 Therapy Diagnosis Mild oral phase dysphagia;Mild pharyngeal phase dysphagia Clinical Impression Pt exhibits min-mild oral and pharygneal dysphagia marked by minimally prolonged mastication, delayed transit and decreased oral cohesion (resulting in premature spill x 1 to pyriform sinuses). Flash laryngeal penetration of thin during pill trial also caused by reduced oral control/cohesion with mixed consistencies. Regular texture and thin liquids recommended, straws allowed, pills whole in applesauce. No further ST needed.  Impact on safety and function Mild aspiration risk   CHL IP TREATMENT RECOMMENDATION 08/22/2016 Treatment Recommendations No treatment recommended at this time   No flowsheet data found. CHL IP DIET RECOMMENDATION 08/22/2016 SLP Diet Recommendations Regular solids;Thin liquid Liquid Administration via Cup;Straw Medication Administration Whole meds with puree Compensations Slow rate;Small sips/bites Postural Changes Seated upright at 90 degrees   CHL IP OTHER RECOMMENDATIONS  08/22/2016 Recommended Consults -- Oral Care Recommendations Oral care BID Other Recommendations --   CHL IP FOLLOW UP RECOMMENDATIONS 08/22/2016 Follow up Recommendations None   No flowsheet data found.     CHL IP ORAL PHASE 08/22/2016 Oral Phase Impaired Oral - Pudding Teaspoon -- Oral - Pudding Cup -- Oral - Honey Teaspoon -- Oral - Honey Cup -- Oral - Nectar Teaspoon -- Oral - Nectar Cup -- Oral - Nectar Straw -- Oral - Thin Teaspoon NT Oral - Thin Cup Lingual/palatal residue Oral - Thin Straw Lingual/palatal residue Oral - Puree -- Oral - Mech Soft -- Oral - Regular Delayed oral transit Oral - Multi-Consistency -- Oral - Pill -- Oral Phase - Comment --  CHL IP PHARYNGEAL PHASE 08/22/2016 Pharyngeal Phase Impaired Pharyngeal- Pudding Teaspoon -- Pharyngeal -- Pharyngeal- Pudding Cup -- Pharyngeal -- Pharyngeal- Honey Teaspoon -- Pharyngeal -- Pharyngeal- Honey Cup -- Pharyngeal -- Pharyngeal- Nectar Teaspoon -- Pharyngeal -- Pharyngeal- Nectar Cup -- Pharyngeal -- Pharyngeal- Nectar Straw -- Pharyngeal -- Pharyngeal- Thin Teaspoon -- Pharyngeal -- Pharyngeal- Thin Cup Delayed swallow initiation-vallecula Pharyngeal -- Pharyngeal- Thin Straw (No Data) Pharyngeal -- Pharyngeal- Puree -- Pharyngeal -- Pharyngeal- Mechanical Soft -- Pharyngeal -- Pharyngeal- Regular WFL Pharyngeal -- Pharyngeal- Multi-consistency -- Pharyngeal -- Pharyngeal- Pill Penetration/Aspiration during swallow Pharyngeal Material enters airway, remains ABOVE vocal cords then ejected out Pharyngeal Comment --  CHL IP CERVICAL ESOPHAGEAL PHASE 08/22/2016 Cervical Esophageal Phase WFL Pudding Teaspoon -- Pudding Cup -- Honey Teaspoon -- Honey Cup -- Nectar Teaspoon -- Nectar Cup -- Nectar Straw -- Thin Teaspoon -- Thin Cup -- Thin Straw -- Puree -- Mechanical Soft -- Regular -- Multi-consistency -- Pill -- Cervical Esophageal Comment -- No flowsheet data found. [place] Siren 08/22/2016, 2:55 PM Orbie Pyo Colvin Caroli.Ed CCC-SLP Pager [phone]               Mr Jodene Nam Head/brain [id number] Cm  Result Date: 08/20/2016 EXAM: MRI HEAD WITHOUT CONTRAST MRA HEAD WITHOUT CONTRAST TECHNIQUE: Multiplanar, multiecho pulse sequences of the brain and surrounding structures were obtained without intravenous contrast. Angiographic images of the head were obtained using MRA technique without contrast.  COMPARISON:  CT 08/20/2016. FINDINGS: MRI HEAD FINDINGS Brain: Acute nonhemorrhagic left paracentral pontine infarct. Remote tiny left caudate head infarct. Remote tiny right cerebellar infarct. Mild chronic microvascular changes. Mild global atrophy without hydrocephalus. Prominent choroid cysts incidentally noted. No intracranial mass lesion noted on this unenhanced exam. Vascular: As below. Skull and upper cervical spine: C3-4 protrusion with moderate spinal stenosis with mild to moderate cord flattening. Sinuses/Orbits: Post lens replacement without acute abnormality. Visualized sinuses are clear. Other: Negative MRA HEAD FINDINGS Mild to slightly moderate narrowing distal vertebral arteries bilaterally. Mild narrowing mid basilar artery. Only proximal aspect of the right posterior inferior cerebellar artery is well delineated with narrowed vessel beyond this region. Mild to moderate narrowing proximal left posterior inferior cerebellar artery. Nonvisualized anterior inferior cerebellar artery bilaterally. Marked narrowing proximal right posterior cerebral artery with moderate to marked narrowing distal right posterior cerebral artery branches. Moderate tandem stenosis mid aspect left posterior cerebral artery. Mild to moderate tandem stenosis right superior cerebellar artery. Moderate narrowing distal M1 segment left middle cerebral artery. Ectatic slightly irregular cavernous segment internal carotid artery bilaterally without significant stenosis. Fetal contribution to the right posterior cerebral artery. IMPRESSION: MRI HEAD Acute nonhemorrhagic left paracentral pontine  infarct. Remote tiny left caudate head infarct. Remote tiny right cerebellar infarct. Mild chronic microvascular changes. C3-4 protrusion with moderate spinal stenosis with mild to moderate cord flattening. MRA HEAD Intracranial atherosclerotic changes more notable involving posterior circulation as detailed above. These results were called by telephone at the time of interpretation on 08/20/2016 at 4:24 pm to Dr. Sharene Butters , who verbally acknowledged these results. Electronically Signed   By: Genia Del M.D.   On: 08/20/2016 17:08    Microbiology: Recent Results (from the past 240 hour(s))  Culture, blood (routine x 2)     Status: None (Preliminary result)   Collection Time: 08/20/16  5:41 PM  Result Value Ref Range Status   Specimen Description BLOOD RIGHT HAND  Final   Special Requests IN PEDIATRIC BOTTLE 3CC  Final   Culture NO GROWTH 2 DAYS  Final   Report Status PENDING  Incomplete  Culture, blood (routine x 2)     Status: None (Preliminary result)   Collection Time: 08/20/16  5:41 PM  Result Value Ref Range Status   Specimen Description BLOOD LEFT HAND  Final   Special Requests IN PEDIATRIC BOTTLE 2CC  Final   Culture NO GROWTH 2 DAYS  Final   Report Status PENDING  Incomplete     Labs: Basic Metabolic Panel:  Recent Labs Lab 08/20/16 1023  NA 135  K 3.8  CL 100*  CO2 26  GLUCOSE 120*  BUN 8  CREATININE 0.90  CALCIUM 9.4   Liver Function Tests:  Recent Labs Lab 08/20/16 1023  AST 22  ALT 12*  ALKPHOS 77  BILITOT 0.6  PROT 7.4  ALBUMIN 3.8   No results for input(s): LIPASE, AMYLASE in the last 168 hours. No results for input(s): AMMONIA in the last 168 hours. CBC:  Recent Labs Lab 08/20/16 1023  WBC 11.2*  NEUTROABS 8.0*  HGB 12.3  HCT 36.1  MCV 96.5  PLT 209   Cardiac Enzymes:  Recent Labs Lab 08/20/16 1741  TROPONINI <0.03   BNP: BNP (last 3 results) No results for input(s): BNP in the last 8760 hours.  ProBNP (last 3 results) No  results for input(s): PROBNP in the last 8760 hours.  CBG: No results for input(s): GLUCAP in the last 168 hours.

## 2016-08-22 NOTE — Progress Notes (Signed)
STROKE TEAM PROGRESS NOTE    SUBJECTIVE (INTERVAL HISTORY) Daughter at bedside. Discussed Aggrenox, CIR and plan of care. Follow up in 6 weeks. Discharge plan in place.   OBJECTIVE Temp:  [97.9 F (36.6 C)-98.9 F (37.2 C)] 97.9 F (36.6 C) (01/11 0913) Pulse Rate:  [73-81] 73 (01/11 0913) Cardiac Rhythm: Heart block (01/11 0700) Resp:  [15-20] 20 (01/11 0913) BP: (128-171)/(46-72) 159/60 (01/11 0913) SpO2:  [96 %-100 %] 96 % (01/11 0913)  CBC:   Recent Labs Lab 08/20/16 1023  WBC 11.2*  NEUTROABS 8.0*  HGB 12.3  HCT 36.1  MCV 96.5  PLT [id number]    Basic Metabolic Panel:   Recent Labs Lab 08/20/16 1023  NA 135  K 3.8  CL 100*  CO2 26  GLUCOSE 120*  BUN 8  CREATININE 0.90  CALCIUM 9.4    Lipid Panel:     Component Value Date/Time   CHOL 168 08/21/2016 0628   TRIG 87 08/21/2016 0628   HDL 57 08/21/2016 0628   CHOLHDL 2.9 08/21/2016 0628   VLDL 17 08/21/2016 0628   LDLCALC 94 08/21/2016 0628   HgbA1c:  Lab Results  Component Value Date   HGBA1C 6.0 (H) 08/21/2016   Urine Drug Screen:     Component Value Date/Time   LABOPIA NONE DETECTED 08/20/2016 1422   COCAINSCRNUR NONE DETECTED 08/20/2016 1422   LABBENZ NONE DETECTED 08/20/2016 1422   AMPHETMU NONE DETECTED 08/20/2016 1422   THCU NONE DETECTED 08/20/2016 1422   LABBARB NONE DETECTED 08/20/2016 1422      IMAGING  Ct Angio Head W Or Wo Contrast  Result Date: 08/20/2016 CLINICAL DATA:  83 year old hypertensive female with right-sided facial droop and right leg weakness. Last seen normal at midnight. Subsequent encounter. EXAM: CT ANGIOGRAPHY HEAD AND NECK. CT PERFUSION BRAIN TECHNIQUE: Multiphase CT imaging of the brain was performed following IV bolus contrast injection. Subsequent parametric perfusion maps were calculated using RAPID software. Multidetector CT imaging of the head and neck was performed using the standard protocol during bolus administration of intravenous contrast. Multiplanar CT  image reconstructions and MIPs were obtained to evaluate the vascular anatomy. Carotid stenosis measurements (when applicable) are obtained utilizing NASCET criteria, using the distal internal carotid diameter as the denominator. CONTRAST:  100 cc Isovue 370. COMPARISON:  08/20/2016 head CT. FINDINGS: CT Brain Perfusion Findings: CBF (<30%) Volume: 24mL Perfusion (Tmax>6.0s) volume: 76mL Mismatch Volume: 84mL Infarction Location:Not identified CT HEAD FINDINGS Brain: No intracranial hemorrhage or CT evidence of large acute infarct. No intracranial mass or abnormal enhancement. No hydrocephalus. Vascular: As below. Skull: Negative. Sinuses: Minimal ethmoid sinus air cell mucosal thickening. Orbits: Post lens replacement without acute abnormality. Review of the MIP images confirms the above findings CTA NECK FINDINGS Aortic arch: 4 vessel arch with left vertebral artery arising directly from the arch. Mild to slightly moderate narrowing origin of the left vertebral artery and left subclavian artery. Ascending thoracic aorta measures up to 3.9 cm. Post CABG. One of the jumped grafts is occluded. Mild dilation at the arch/proximal descending thoracic aortic junction with prominent complex plaque in small ulcerations. Right carotid system: Plaque and ectasia involving portions of the right common carotid artery and internal carotid artery. Less than 60% diameter narrowing. Left carotid system: Plaque and ectasia involving portions of the left common carotid artery and internal carotid artery. Less than 60% diameter narrowing. Vertebral arteries: Left vertebral artery arises directly from the aortic arch with mild slightly moderate narrowing proximal aspect. Moderate narrowing proximal right  subclavian artery with complex plaque. Moderate narrowing origin of the right vertebral artery. Mild narrowing vertebral arteries at the C1-2 level. Skeleton: Cervical spondylotic changes most notable C6-7 followed by the C3-4 level.  Remote Schmorl's node deformity and 20% loss of height superior endplate T5. Left lobe of thyroid gland extends slightly inferiorly but without worrisome mass. Mild symmetric prominence lingual tonsillar tissue with pulled secretions vallecula. Right carotid artery impresses upon the posterior aspect of the pharynx. Upper chest: Scattered small calcified granulomas. Review of the MIP images confirms the above findings CTA HEAD FINDINGS Anterior circulation: Anterior circulation without medium or large size vessel significant stenosis or occlusion. Cavernous segment internal carotid artery calcification ectasia with mild narrowing and dilation without high-grade stenosis. Posterior circulation: Calcified vertebral arteries with mild slightly moderate narrowing at the level the foramen magnum. No significant stenosis of the basilar artery. Moderate narrowing proximal right posterior cerebral artery. Bilateral posterior cerebral artery distal branch vessel mild narrowing and irregularity. Venous sinuses: Patent. Anatomic variants: Fetal contribution to the right posterior cerebral artery. Delayed phase: As above. Review of the MIP images confirms the above findings IMPRESSION: CT Brain Perfusion CBF (<30%) Volume: 40mL Perfusion (Tmax>6.0s) volume: 51mL Mismatch Volume: 75mL CT HEAD No intracranial hemorrhage or CT evidence of large acute infarct. CTA NECK FINDINGS Ascending thoracic aorta measures up to 3.9 cm. Recommend annual imaging followup by CTA or MRA. This recommendation follows 2010 ACCF/AHA/AATS/ACR/ASA/SCA/SCAI/SIR/STS/SVM Guidelines for the Diagnosis and Management of Patients with Thoracic Aortic Disease. Circulation.2010; [id number] Post CABG. One of the jumped grafts is occluded. Mild dilation at the arch/proximal descending thoracic aortic junction with prominent complex plaque with small ULCERATIONS. Plaque and ectasia involving portions both common carotid arteries and both internal carotid  arteries. Less than 60% diameter narrowing. Left vertebral artery arises directly from the aortic arch with mild to slightly moderate narrowing proximal aspect. Moderate narrowing proximal right subclavian artery with complex plaque. Moderate narrowing origin of the right vertebral artery. Mild narrowing vertebral arteries at the C1-2 level. CTA HEAD Anterior circulation without medium or large size vessel significant stenosis or occlusion. Calcified vertebral arteries with mild to slightly moderate narrowing at the level the foramen magnum. No significant stenosis of the basilar artery. Moderate narrowing proximal right posterior cerebral artery. Bilateral posterior cerebral artery distal branch vessel mild narrowing and irregularity. Electronically Signed   By: Genia Del M.D.   On: 08/20/2016 12:34   Ct Angio Neck W And/or Wo Contrast  Result Date: 08/20/2016 CLINICAL DATA:  83 year old hypertensive female with right-sided facial droop and right leg weakness. Last seen normal at midnight. Subsequent encounter. EXAM: CT ANGIOGRAPHY HEAD AND NECK. CT PERFUSION BRAIN TECHNIQUE: Multiphase CT imaging of the brain was performed following IV bolus contrast injection. Subsequent parametric perfusion maps were calculated using RAPID software. Multidetector CT imaging of the head and neck was performed using the standard protocol during bolus administration of intravenous contrast. Multiplanar CT image reconstructions and MIPs were obtained to evaluate the vascular anatomy. Carotid stenosis measurements (when applicable) are obtained utilizing NASCET criteria, using the distal internal carotid diameter as the denominator. CONTRAST:  100 cc Isovue 370. COMPARISON:  08/20/2016 head CT. FINDINGS: CT Brain Perfusion Findings: CBF (<30%) Volume: 57mL Perfusion (Tmax>6.0s) volume: 49mL Mismatch Volume: 35mL Infarction Location:Not identified CT HEAD FINDINGS Brain: No intracranial hemorrhage or CT evidence of large acute  infarct. No intracranial mass or abnormal enhancement. No hydrocephalus. Vascular: As below. Skull: Negative. Sinuses: Minimal ethmoid sinus air cell mucosal thickening. Orbits: Post  lens replacement without acute abnormality. Review of the MIP images confirms the above findings CTA NECK FINDINGS Aortic arch: 4 vessel arch with left vertebral artery arising directly from the arch. Mild to slightly moderate narrowing origin of the left vertebral artery and left subclavian artery. Ascending thoracic aorta measures up to 3.9 cm. Post CABG. One of the jumped grafts is occluded. Mild dilation at the arch/proximal descending thoracic aortic junction with prominent complex plaque in small ulcerations. Right carotid system: Plaque and ectasia involving portions of the right common carotid artery and internal carotid artery. Less than 60% diameter narrowing. Left carotid system: Plaque and ectasia involving portions of the left common carotid artery and internal carotid artery. Less than 60% diameter narrowing. Vertebral arteries: Left vertebral artery arises directly from the aortic arch with mild slightly moderate narrowing proximal aspect. Moderate narrowing proximal right subclavian artery with complex plaque. Moderate narrowing origin of the right vertebral artery. Mild narrowing vertebral arteries at the C1-2 level. Skeleton: Cervical spondylotic changes most notable C6-7 followed by the C3-4 level. Remote Schmorl's node deformity and 20% loss of height superior endplate T5. Left lobe of thyroid gland extends slightly inferiorly but without worrisome mass. Mild symmetric prominence lingual tonsillar tissue with pulled secretions vallecula. Right carotid artery impresses upon the posterior aspect of the pharynx. Upper chest: Scattered small calcified granulomas. Review of the MIP images confirms the above findings CTA HEAD FINDINGS Anterior circulation: Anterior circulation without medium or large size vessel  significant stenosis or occlusion. Cavernous segment internal carotid artery calcification ectasia with mild narrowing and dilation without high-grade stenosis. Posterior circulation: Calcified vertebral arteries with mild slightly moderate narrowing at the level the foramen magnum. No significant stenosis of the basilar artery. Moderate narrowing proximal right posterior cerebral artery. Bilateral posterior cerebral artery distal branch vessel mild narrowing and irregularity. Venous sinuses: Patent. Anatomic variants: Fetal contribution to the right posterior cerebral artery. Delayed phase: As above. Review of the MIP images confirms the above findings IMPRESSION: CT Brain Perfusion CBF (<30%) Volume: 75mL Perfusion (Tmax>6.0s) volume: 31mL Mismatch Volume: 2mL CT HEAD No intracranial hemorrhage or CT evidence of large acute infarct. CTA NECK FINDINGS Ascending thoracic aorta measures up to 3.9 cm. Recommend annual imaging followup by CTA or MRA. This recommendation follows 2010 ACCF/AHA/AATS/ACR/ASA/SCA/SCAI/SIR/STS/SVM Guidelines for the Diagnosis and Management of Patients with Thoracic Aortic Disease. Circulation.2010; [id number] Post CABG. One of the jumped grafts is occluded. Mild dilation at the arch/proximal descending thoracic aortic junction with prominent complex plaque with small ULCERATIONS. Plaque and ectasia involving portions both common carotid arteries and both internal carotid arteries. Less than 60% diameter narrowing. Left vertebral artery arises directly from the aortic arch with mild to slightly moderate narrowing proximal aspect. Moderate narrowing proximal right subclavian artery with complex plaque. Moderate narrowing origin of the right vertebral artery. Mild narrowing vertebral arteries at the C1-2 level. CTA HEAD Anterior circulation without medium or large size vessel significant stenosis or occlusion. Calcified vertebral arteries with mild to slightly moderate narrowing at the level  the foramen magnum. No significant stenosis of the basilar artery. Moderate narrowing proximal right posterior cerebral artery. Bilateral posterior cerebral artery distal branch vessel mild narrowing and irregularity. Electronically Signed   By: Genia Del M.D.   On: 08/20/2016 12:34   Mr Brain Wo Contrast  Result Date: 08/20/2016 EXAM: MRI HEAD WITHOUT CONTRAST MRA HEAD WITHOUT CONTRAST TECHNIQUE: Multiplanar, multiecho pulse sequences of the brain and surrounding structures were obtained without intravenous contrast. Angiographic images of the  head were obtained using MRA technique without contrast. COMPARISON:  CT 08/20/2016. FINDINGS: MRI HEAD FINDINGS Brain: Acute nonhemorrhagic left paracentral pontine infarct. Remote tiny left caudate head infarct. Remote tiny right cerebellar infarct. Mild chronic microvascular changes. Mild global atrophy without hydrocephalus. Prominent choroid cysts incidentally noted. No intracranial mass lesion noted on this unenhanced exam. Vascular: As below. Skull and upper cervical spine: C3-4 protrusion with moderate spinal stenosis with mild to moderate cord flattening. Sinuses/Orbits: Post lens replacement without acute abnormality. Visualized sinuses are clear. Other: Negative MRA HEAD FINDINGS Mild to slightly moderate narrowing distal vertebral arteries bilaterally. Mild narrowing mid basilar artery. Only proximal aspect of the right posterior inferior cerebellar artery is well delineated with narrowed vessel beyond this region. Mild to moderate narrowing proximal left posterior inferior cerebellar artery. Nonvisualized anterior inferior cerebellar artery bilaterally. Marked narrowing proximal right posterior cerebral artery with moderate to marked narrowing distal right posterior cerebral artery branches. Moderate tandem stenosis mid aspect left posterior cerebral artery. Mild to moderate tandem stenosis right superior cerebellar artery. Moderate narrowing distal M1  segment left middle cerebral artery. Ectatic slightly irregular cavernous segment internal carotid artery bilaterally without significant stenosis. Fetal contribution to the right posterior cerebral artery. IMPRESSION: MRI HEAD Acute nonhemorrhagic left paracentral pontine infarct. Remote tiny left caudate head infarct. Remote tiny right cerebellar infarct. Mild chronic microvascular changes. C3-4 protrusion with moderate spinal stenosis with mild to moderate cord flattening. MRA HEAD Intracranial atherosclerotic changes more notable involving posterior circulation as detailed above. These results were called by telephone at the time of interpretation on 08/20/2016 at 4:24 pm to Dr. Sharene Butters , who verbally acknowledged these results. Electronically Signed   By: Genia Del M.D.   On: 08/20/2016 17:08   Ct Cerebral Perfusion W Contrast  Result Date: 08/20/2016 CLINICAL DATA:  83 year old hypertensive female with right-sided facial droop and right leg weakness. Last seen normal at midnight. Subsequent encounter. EXAM: CT ANGIOGRAPHY HEAD AND NECK. CT PERFUSION BRAIN TECHNIQUE: Multiphase CT imaging of the brain was performed following IV bolus contrast injection. Subsequent parametric perfusion maps were calculated using RAPID software. Multidetector CT imaging of the head and neck was performed using the standard protocol during bolus administration of intravenous contrast. Multiplanar CT image reconstructions and MIPs were obtained to evaluate the vascular anatomy. Carotid stenosis measurements (when applicable) are obtained utilizing NASCET criteria, using the distal internal carotid diameter as the denominator. CONTRAST:  100 cc Isovue 370. COMPARISON:  08/20/2016 head CT. FINDINGS: CT Brain Perfusion Findings: CBF (<30%) Volume: 74mL Perfusion (Tmax>6.0s) volume: 38mL Mismatch Volume: 62mL Infarction Location:Not identified CT HEAD FINDINGS Brain: No intracranial hemorrhage or CT evidence of large acute  infarct. No intracranial mass or abnormal enhancement. No hydrocephalus. Vascular: As below. Skull: Negative. Sinuses: Minimal ethmoid sinus air cell mucosal thickening. Orbits: Post lens replacement without acute abnormality. Review of the MIP images confirms the above findings CTA NECK FINDINGS Aortic arch: 4 vessel arch with left vertebral artery arising directly from the arch. Mild to slightly moderate narrowing origin of the left vertebral artery and left subclavian artery. Ascending thoracic aorta measures up to 3.9 cm. Post CABG. One of the jumped grafts is occluded. Mild dilation at the arch/proximal descending thoracic aortic junction with prominent complex plaque in small ulcerations. Right carotid system: Plaque and ectasia involving portions of the right common carotid artery and internal carotid artery. Less than 60% diameter narrowing. Left carotid system: Plaque and ectasia involving portions of the left common carotid artery and internal carotid  artery. Less than 60% diameter narrowing. Vertebral arteries: Left vertebral artery arises directly from the aortic arch with mild slightly moderate narrowing proximal aspect. Moderate narrowing proximal right subclavian artery with complex plaque. Moderate narrowing origin of the right vertebral artery. Mild narrowing vertebral arteries at the C1-2 level. Skeleton: Cervical spondylotic changes most notable C6-7 followed by the C3-4 level. Remote Schmorl's node deformity and 20% loss of height superior endplate T5. Left lobe of thyroid gland extends slightly inferiorly but without worrisome mass. Mild symmetric prominence lingual tonsillar tissue with pulled secretions vallecula. Right carotid artery impresses upon the posterior aspect of the pharynx. Upper chest: Scattered small calcified granulomas. Review of the MIP images confirms the above findings CTA HEAD FINDINGS Anterior circulation: Anterior circulation without medium or large size vessel  significant stenosis or occlusion. Cavernous segment internal carotid artery calcification ectasia with mild narrowing and dilation without high-grade stenosis. Posterior circulation: Calcified vertebral arteries with mild slightly moderate narrowing at the level the foramen magnum. No significant stenosis of the basilar artery. Moderate narrowing proximal right posterior cerebral artery. Bilateral posterior cerebral artery distal branch vessel mild narrowing and irregularity. Venous sinuses: Patent. Anatomic variants: Fetal contribution to the right posterior cerebral artery. Delayed phase: As above. Review of the MIP images confirms the above findings IMPRESSION: CT Brain Perfusion CBF (<30%) Volume: 43mL Perfusion (Tmax>6.0s) volume: 45mL Mismatch Volume: 26mL CT HEAD No intracranial hemorrhage or CT evidence of large acute infarct. CTA NECK FINDINGS Ascending thoracic aorta measures up to 3.9 cm. Recommend annual imaging followup by CTA or MRA. This recommendation follows 2010 ACCF/AHA/AATS/ACR/ASA/SCA/SCAI/SIR/STS/SVM Guidelines for the Diagnosis and Management of Patients with Thoracic Aortic Disease. Circulation.2010; [id number] Post CABG. One of the jumped grafts is occluded. Mild dilation at the arch/proximal descending thoracic aortic junction with prominent complex plaque with small ULCERATIONS. Plaque and ectasia involving portions both common carotid arteries and both internal carotid arteries. Less than 60% diameter narrowing. Left vertebral artery arises directly from the aortic arch with mild to slightly moderate narrowing proximal aspect. Moderate narrowing proximal right subclavian artery with complex plaque. Moderate narrowing origin of the right vertebral artery. Mild narrowing vertebral arteries at the C1-2 level. CTA HEAD Anterior circulation without medium or large size vessel significant stenosis or occlusion. Calcified vertebral arteries with mild to slightly moderate narrowing at the level  the foramen magnum. No significant stenosis of the basilar artery. Moderate narrowing proximal right posterior cerebral artery. Bilateral posterior cerebral artery distal branch vessel mild narrowing and irregularity. Electronically Signed   By: Genia Del M.D.   On: 08/20/2016 12:34   Dg Chest Portable 1 View  Result Date: 08/20/2016 CLINICAL DATA:  Stroke-like symptoms EXAM: PORTABLE CHEST 1 VIEW COMPARISON:  None. FINDINGS: Cardiac shadow is mildly enlarged. Postsurgical changes are noted. The lungs are well aerated bilaterally without focal infiltrate or sizable effusion. No acute bony abnormality is noted. IMPRESSION: No acute abnormality seen. Electronically Signed   By: Inez Catalina M.D.   On: 08/20/2016 12:08   Mr Jodene Nam Head/brain [id number] Cm  Result Date: 08/20/2016 EXAM: MRI HEAD WITHOUT CONTRAST MRA HEAD WITHOUT CONTRAST TECHNIQUE: Multiplanar, multiecho pulse sequences of the brain and surrounding structures were obtained without intravenous contrast. Angiographic images of the head were obtained using MRA technique without contrast. COMPARISON:  CT 08/20/2016. FINDINGS: MRI HEAD FINDINGS Brain: Acute nonhemorrhagic left paracentral pontine infarct. Remote tiny left caudate head infarct. Remote tiny right cerebellar infarct. Mild chronic microvascular changes. Mild global atrophy without hydrocephalus. Prominent choroid cysts incidentally  noted. No intracranial mass lesion noted on this unenhanced exam. Vascular: As below. Skull and upper cervical spine: C3-4 protrusion with moderate spinal stenosis with mild to moderate cord flattening. Sinuses/Orbits: Post lens replacement without acute abnormality. Visualized sinuses are clear. Other: Negative MRA HEAD FINDINGS Mild to slightly moderate narrowing distal vertebral arteries bilaterally. Mild narrowing mid basilar artery. Only proximal aspect of the right posterior inferior cerebellar artery is well delineated with narrowed vessel beyond this region.  Mild to moderate narrowing proximal left posterior inferior cerebellar artery. Nonvisualized anterior inferior cerebellar artery bilaterally. Marked narrowing proximal right posterior cerebral artery with moderate to marked narrowing distal right posterior cerebral artery branches. Moderate tandem stenosis mid aspect left posterior cerebral artery. Mild to moderate tandem stenosis right superior cerebellar artery. Moderate narrowing distal M1 segment left middle cerebral artery. Ectatic slightly irregular cavernous segment internal carotid artery bilaterally without significant stenosis. Fetal contribution to the right posterior cerebral artery. IMPRESSION: MRI HEAD Acute nonhemorrhagic left paracentral pontine infarct. Remote tiny left caudate head infarct. Remote tiny right cerebellar infarct. Mild chronic microvascular changes. C3-4 protrusion with moderate spinal stenosis with mild to moderate cord flattening. MRA HEAD Intracranial atherosclerotic changes more notable involving posterior circulation as detailed above. These results were called by telephone at the time of interpretation on 08/20/2016 at 4:24 pm to Dr. Sharene Butters , who verbally acknowledged these results. Electronically Signed   By: Genia Del M.D.   On: 08/20/2016 17:08    Carotid Doppler   There is 1-39% bilateral ICA stenosis. Vertebral artery flow is antegrade.    2D Echocardiogram  - Left ventricle: The cavity size was normal. There was moderate focal basal hypertrophy. Systolic function was normal. The estimated ejection fraction was in the range of 55% to 60%. Wall motion was normal; there were no regional wall motion abnormalities. Doppler parameters are consistent with abnormal left ventricular relaxation (grade 1 diastolic dysfunction). Doppler parameters are consistent with high ventricular filling pressure. - Aortic valve: Severely calcified annulus. Trileaflet; normal thickness, moderately calcified leaflets. There was  moderate regurgitation. Regurgitation pressure half-time: 378 ms. - Aorta: Ascending aorta diameter: 37 mm (ED). - Ascending aorta: The ascending aorta was mildly dilated. - Mitral valve: Severely calcified annulus. There was mild regurgitation. - Pulmonary arteries: PA peak pressure: 46 mm Hg (S). Impressions:   The right ventricular systolic pressure was increased consistent with moderate pulmonary hypertension.   PHYSICAL EXAM  pleasant frail elderly Caucasian lady currently not in distress. . Afebrile. Head is nontraumatic. Neck is supple without bruit.    Cardiac exam no murmur or gallop. Lungs are clear to auscultation. Distal pulses are well felt. Neurological Exam ;  Awake  Alert oriented x 3.Dysarthric speech and right lower face weakness..eye movements full without nystagmus.fundi were not visualized. Vision acuity and fields appear normal. Hearing is normal. Palatal movements are normal. Face symmetric. Tongue midline. Normal strength, tone, reflexes and coordination l sensation on left side and grade 3/5 right UE and 4/5 RLE weakness.. Gait deferred.  ASSESSMENT/PLAN [name] is a 83 y.o. female with history of HTN,CAD s/p CABG in 1995,with stents, HLD,  Systolic CHF presenting with R sided weakness. She did not receive IV t-PA due to delay in arrival.   Stroke:  L paracentral pontine and L caudate head infarct, felt to be secondary to small vessel disease   Resultant  R hemiparesis  CTA H&N no large infarct, ascending thoracic arota 3.9cm, post CABG grafts occluded, descending thoracic aortic jxn  w/ complex plaque w/ sm ulcerations, plaque both CCA, RSCA complex plaque  CTP mismatch 19mL  MRI  L paracentral pontine infarct. Tiny L caudate head infarct. Old R cerebellar infarct. C3-4 mod spinal stenosis and cord flattening  MRA  Posterior circulation atherosclerosis  Carotid Doppler  No significant stenosis   2D Echo  EF 55-60%. No source of embolus  LDL  94  HgbA1c 6.0  Heparin 5000 units sq tid for VTE prophylaxis DIET DYS 3 Room service appropriate? Yes; Fluid consistency: Honey Thick  aspirin 81 mg daily prior to admission, now on aspirin 325 mg daily. Pt prefers not to be changed to plavix, so will Change to  dipyridamole SR 250 mg/aspirin 25 mg orally twice a day for secondary stroke prevention. To prevent headache, most common side effect of Aggrenox, will start Aggrenox q hs x 2 weeks then increase Aggrenox to bid.  Until then, aspirin 81 mg q am x 2 weeks, then discontinue. May take Tylenol 650 mg 1 hr prior to Aggrenox for the first week, then discontinue.  Continue to open Aggrenox capsule if pt cannot swallow whole - cannot crush or chew.  Patient counseled to be compliant with her antithrombotic medications  Ongoing aggressive stroke risk factor management  Therapy recommendations:  CIR  Disposition:  pending (lived alone PTA, family arranging someone to stay  With her as needed)  Hypertension  Stable  Permissive hypertension (OK if < 220/120) but gradually normalize in 5-7 days  Long-term BP goal normotensive  Hyperlipidemia  Home meds:  zocor 40 mg daily, resumed in hospital  LDL 94, goal < 70  Consider increase zocor or change to lipitor for better control  Continue statin at discharge  Other Stroke Risk Factors  Advanced age  Family hx stroke (mother)  Coronary artery disease s/p CABG  Other Active Problems  hypothyroidism  Hospital day # [place] for Pager information 08/22/2016 11:28 AM  I have personally examined this patient, reviewed notes, independently viewed imaging studies, participated in medical decision making and plan of care.ROS completed by me personally and pertinent positives fully documented  I have made any additions or clarifications directly to the above note. Agree with note above. Stroke team will sign off. Follow-up as an outpatient in  stroke clinic in 6 weeks.  Antony Contras, MD Medical Director [facility] Pager: [phone] 08/22/2016 3:26 PM  To contact Stroke Continuity provider, please refer to [email or web address] After hours, contact General Neurology

## 2016-08-22 NOTE — Care Management Note (Signed)
Case Management Note  Patient Details  Name: INDEE GRZESKOWIAK MRN: [id number] Date of Birth: 1929-06-04  Subjective/Objective:                    Action/Plan: Pt discharging to CIR today. No further needs per CM.   Expected Discharge Date:  08/24/16               Expected Discharge Plan:  [place]  In-House Referral:  NA  Discharge planning Services  CM Consult  Post Acute Care Choice:    Choice offered to:     DME Arranged:    DME Agency:     HH Arranged:    HH Agency:     Status of Service:  Completed, signed off  If discussed at [employer], dates discussed:    Additional Comments:  Pollie Friar, RN 08/22/2016, 4:19 PM

## 2016-08-22 NOTE — Progress Notes (Signed)
Pt discharge education completed; pt discharge to CIR and report called off to nurse Nastashia on the unit. IV saline locked; telemetry removed; pt to be transported off unit via bed with belongings to the side along with family to the side. Delia Heady RN

## 2016-08-22 NOTE — Discharge Instructions (Signed)
Aspirin, ASA; Dipyridamole capsules What is this medicine? ASPIRIN; DIPYRIDAMOLE (AS pir in; dye peer ID a mole) is used to decrease the risk of stroke in patients who have had a stroke or transient ischemic attack. A transient ischemic attack is also known as a TIA or mini-stroke. This medicine may be used for other purposes; ask your health care provider or pharmacist if you have questions. COMMON BRAND NAME(S): Aggrenox What should I tell my health care provider before I take this medicine? They need to know if you have any of these conditions: -asthma -bleeding or clotting problems -drink more than 3 alcoholic beverages a day -kidney or liver disease -nasal polyps -stomach ulcers, or other stomach problems -vitamin K deficiency -an unusual or allergic reaction to aspirin, dipyridamole, salicylates, NSAIDs, tartrazine dye, other medicines, dyes, or preservatives -pregnant or trying to get pregnant -breast-feeding How should I use this medicine? Take this medicine by mouth with a glass of water. Follow the directions on the label. The capsules must be swallowed whole. Do not crush or chew. You can take this medicine with or without food. Take your doses at regular intervals. Do not take your medicine more often than directed. Talk to your pediatrician regarding the use of this medicine in children. Special care may be needed. Overdosage: If you think you have taken too much of this medicine contact a poison control center or emergency room at once. NOTE: This medicine is only for you. Do not share this medicine with others. What if I miss a dose? If you miss a dose, take it as soon as you can. If it is almost time for your next dose, take only that dose. Do not take double or extra doses. What may interact with this medicine? Do not take this medicine with any of the following medications: -medicines that treat or prevent blood clots like warfarin or heparin -methotrexate This medicine  may also interact with the following medications: -acetazolamide -adenosine -anagrelide -antiinflammatory drugs, NSAIDs like ibuprofen -aspirin-containing medicines or other salicylates -diuretics -medicines for Alzheimer's disease or myasthenia gravis -medicines for diabetes that are taken by mouth -medicines for high blood pressure like ACE inhibitors or beta-blockers -medicines for seizures like phenytoin or valproic acid -probenecid -sulfinpyrazone This list may not describe all possible interactions. Give your health care provider a list of all the medicines, herbs, non-prescription drugs, or dietary supplements you use. Also tell them if you smoke, drink alcohol, or use illegal drugs. Some items may interact with your medicine. What should I watch for while using this medicine? Do not take other aspirin products unless directed by your doctor or health care professional. Many non-prescription medicines contain aspirin. To prevent accidental overdose, read labels carefully and do not take more than one product that contains aspirin. Notify your doctor or health care professional and seek emergency treatment if you develop breathing problems; changes in vision; chest pain; severe, sudden headache; pain, swelling, warmth in the leg; trouble speaking; sudden numbness or weakness of the face, arm, or leg. These can be signs that your condition has gotten worse. If you have diabetes, this medicine may affect your blood sugar levels. Check with your doctor or health care professional before you change your diet or the dose of your diabetes medicine. Aspirin can irritate your stomach. Drinking alcohol and smoking cigarettes can make this irritation worse and may cause ulcers or bleeding problems. Ask your doctor or health care professional for help to stop smoking or drinking. Do not lie  down for 30 minutes after taking this medicine to prevent irritation to your throat. If you are receiving cancer  chemotherapy or medicine for your immune system, do not take this medicine without checking with your doctor or health care professional. Aspirin may hide the signs of an infection like fever or pain and increase your risk of bleeding. What side effects may I notice from receiving this medicine? Side effects that you should report to your doctor or health care professional as soon as possible: -allergic reactions like skin rash, itching or hives, swelling of the face, lips, or tongue -fast, irregular heartbeat -pain on swallowing -redness, blistering, peeling or loosening of the skin, including inside the mouth or nose -ringing in the ears -seizure -signs and symptoms of bleeding such as bloody or black, tarry stools; red or dark-brown urine; spitting up blood or brown material that looks like coffee grounds; red spots on the skin; unusual bruising or bleeding from the eye, gums, or nose -unusually weak or tired Side effects that usually do not require medical attention (report to your doctor or health care professional if they continue or are bothersome): -diarrhea -flushing, reddening of the skin -headache -nausea -reduced amount of urine passed This list may not describe all possible side effects. Call your doctor for medical advice about side effects. You may report side effects to FDA at 1-800-FDA-1088. Where should I keep my medicine? Keep out of the reach of children. Store at room temperature between 15 and 30 degrees C (59 and 86 degrees F). Protect from excessive heat and moisture. Throw away any unused medicine after the expiration date. NOTE: This sheet is a summary. It may not cover all possible information. If you have questions about this medicine, talk to your doctor, pharmacist, or health care provider.  2017 [employer]vier/Gold Standard (2014-06-28 11:46:18)

## 2016-08-22 NOTE — Progress Notes (Signed)
Inpatient Rehabilitation  Met with patient and daughter to discuss team's recommendation for IP Rehab.  Shared booklets and answered questions.  Patient's goal is to return home and willing to make modifications to allow for this.  Daughter are supportive of this goal as well.  I initiated insurance authorization.  Plan to follow along for timing of medical readiness and insurance authorization.  Please call with questions.   Carmelia Roller., CCC/SLP Admission Coordinator  [place]  Cell [phone]

## 2016-08-23 ENCOUNTER — Inpatient Hospital Stay (HOSPITAL_COMMUNITY): Payer: Medicare Other | Admitting: Occupational Therapy

## 2016-08-23 ENCOUNTER — Inpatient Hospital Stay (HOSPITAL_COMMUNITY): Payer: Medicare Other | Admitting: Physical Therapy

## 2016-08-23 ENCOUNTER — Encounter (HOSPITAL_COMMUNITY): Payer: Self-pay

## 2016-08-23 ENCOUNTER — Inpatient Hospital Stay (HOSPITAL_COMMUNITY): Payer: Medicare Other | Admitting: Speech Pathology

## 2016-08-23 DIAGNOSIS — G479 Sleep disorder, unspecified: Secondary | ICD-10-CM

## 2016-08-23 DIAGNOSIS — I6350 Cerebral infarction due to unspecified occlusion or stenosis of unspecified cerebral artery: Secondary | ICD-10-CM

## 2016-08-23 DIAGNOSIS — I10 Essential (primary) hypertension: Secondary | ICD-10-CM

## 2016-08-23 DIAGNOSIS — D72829 Elevated white blood cell count, unspecified: Secondary | ICD-10-CM

## 2016-08-23 DIAGNOSIS — I69998 Other sequelae following unspecified cerebrovascular disease: Secondary | ICD-10-CM

## 2016-08-23 DIAGNOSIS — I5022 Chronic systolic (congestive) heart failure: Secondary | ICD-10-CM

## 2016-08-23 DIAGNOSIS — G8190 Hemiplegia, unspecified affecting unspecified side: Secondary | ICD-10-CM

## 2016-08-23 DIAGNOSIS — I509 Heart failure, unspecified: Secondary | ICD-10-CM

## 2016-08-23 DIAGNOSIS — D62 Acute posthemorrhagic anemia: Secondary | ICD-10-CM

## 2016-08-23 DIAGNOSIS — E876 Hypokalemia: Secondary | ICD-10-CM

## 2016-08-23 LAB — CBC WITH DIFFERENTIAL/PLATELET
Basophils Absolute: 0 10*3/uL (ref 0.0–0.1)
Basophils Relative: 0 %
Eosinophils Absolute: 0.1 10*3/uL (ref 0.0–0.7)
Eosinophils Relative: 1 %
HCT: 34.8 % — ABNORMAL LOW (ref 36.0–46.0)
Hemoglobin: 11.9 g/dL — ABNORMAL LOW (ref 12.0–15.0)
Lymphocytes Relative: 18 %
Lymphs Abs: 2 10*3/uL (ref 0.7–4.0)
MCH: 32.5 pg (ref 26.0–34.0)
MCHC: 34.2 g/dL (ref 30.0–36.0)
MCV: 95.1 fL (ref 78.0–100.0)
Monocytes Absolute: 1 10*3/uL (ref 0.1–1.0)
Monocytes Relative: 9 %
Neutro Abs: 7.9 10*3/uL — ABNORMAL HIGH (ref 1.7–7.7)
Neutrophils Relative %: 72 %
Platelets: 181 10*3/uL (ref 150–400)
RBC: 3.66 MIL/uL — ABNORMAL LOW (ref 3.87–5.11)
RDW: 11.9 % (ref 11.5–15.5)
WBC: 11 10*3/uL — ABNORMAL HIGH (ref 4.0–10.5)

## 2016-08-23 LAB — COMPREHENSIVE METABOLIC PANEL
ALT: 10 U/L — ABNORMAL LOW (ref 14–54)
AST: 19 U/L (ref 15–41)
Albumin: 3.1 g/dL — ABNORMAL LOW (ref 3.5–5.0)
Alkaline Phosphatase: 76 U/L (ref 38–126)
Anion gap: 10 (ref 5–15)
BUN: 5 mg/dL — ABNORMAL LOW (ref 6–20)
CO2: 24 mmol/L (ref 22–32)
Calcium: 8.9 mg/dL (ref 8.9–10.3)
Chloride: 102 mmol/L (ref 101–111)
Creatinine, Ser: 0.7 mg/dL (ref 0.44–1.00)
GFR calc Af Amer: >60 mL/min (ref >60)
GFR calc non Af Amer: >60 mL/min (ref >60)
Glucose, Bld: 118 mg/dL — ABNORMAL HIGH (ref 65–99)
Potassium: 3.1 mmol/L — ABNORMAL LOW (ref 3.5–5.1)
Sodium: 136 mmol/L (ref 135–145)
Total Bilirubin: 0.9 mg/dL (ref 0.3–1.2)
Total Protein: 6.8 g/dL (ref 6.5–8.1)

## 2016-08-23 MED ORDER — FLUOXETINE HCL 10 MG PO CAPS
10.0000 mg | ORAL_CAPSULE | Freq: Every day | ORAL | Status: DC
  Administered 2016-08-23 – 2016-09-07 (×16): 10 mg via ORAL
  Filled 2016-08-23 (×16): qty 1

## 2016-08-23 MED ORDER — POTASSIUM CHLORIDE CRYS ER 20 MEQ PO TBCR
20.0000 meq | EXTENDED_RELEASE_TABLET | Freq: Two times a day (BID) | ORAL | Status: AC
  Administered 2016-08-23 – 2016-08-24 (×4): 20 meq via ORAL
  Filled 2016-08-23 (×4): qty 1

## 2016-08-23 MED ORDER — ORAL CARE MOUTH RINSE
15.0000 mL | Freq: Two times a day (BID) | OROMUCOSAL | Status: DC
  Administered 2016-08-23 – 2016-08-27 (×7): 15 mL via OROMUCOSAL

## 2016-08-23 NOTE — Progress Notes (Addendum)
[place] PHYSICAL MEDICINE & REHABILITATION     PROGRESS NOTE  Subjective/Complaints:  Pt seen laying in bed this AM.  She did not sleep well, "just because".  She is ready for therapies.  ROS: Denies CP, SOB, N/V/D.  Objective: Vital Signs: Blood pressure (!) 144/100, pulse 78, temperature 98.9 F (37.2 C), temperature source Oral, resp. rate 16, height 5\' 4"  (1.626 m), weight 69 kg (152 lb 1.9 oz), SpO2 100 %. Dg Swallowing Func-speech Pathology  Result Date: 08/22/2016 Objective Swallowing Evaluation: Type of Study: MBS-Modified Barium Swallow Study Patient Details Name: Yvonne Parrish MRN: [id number] Date of Birth: October 07, 1928 Today's Date: 08/22/2016 Time: SLP Start Time (ACUTE ONLY): 1300-SLP Stop Time (ACUTE ONLY): 1315 SLP Time Calculation (min) (ACUTE ONLY): 15 min Past Medical History: Past Medical History: Diagnosis Date . Blood dyscrasia  . CHF (congestive heart failure) ([place])  . COPD (chronic obstructive pulmonary disease) ([place])  . Heart murmur  . Hypertension  . Hypothyroidism  Past Surgical History: Past Surgical History: Procedure Laterality Date . CORONARY ANGIOPLASTY   . CORONARY ARTERY BYPASS GRAFT  1999 . KNEE ARTHROSCOPY   . LUMBAR SPINE SURGERY   . TONSILLECTOMY   HPI: Yvonne R Jonesis a 83 y.o.femalewith medical history significant for COPD, HTN, CAD s/p CABG in 1995,with stents, TIA symptoms 6 months ago, HLD, systolic CHF presenting with right sided weakness and dysphasia. MRI acute nonhemorrhagic left paracentral pontine infarct. Remote tiny left caudate head infarct. Remote tiny right cerebellar infarct. Mild chronic microvascular changes. CXR no acute abnormality seen. MD note the patient also with C3-C4 protrusion with moderate spinal stenosis with mild to moderate cord flattening. No Data Recorded Assessment / Plan / Recommendation CHL IP CLINICAL IMPRESSIONS 08/22/2016 Therapy Diagnosis Mild oral phase dysphagia;Mild pharyngeal phase dysphagia Clinical Impression Pt  exhibits min-mild oral and pharygneal dysphagia marked by minimally prolonged mastication, delayed transit and decreased oral cohesion (resulting in premature spill x 1 to pyriform sinuses). Flash laryngeal penetration of thin during pill trial also caused by reduced oral control/cohesion with mixed consistencies. Regular texture and thin liquids recommended, straws allowed, pills whole in applesauce. No further ST needed.  Impact on safety and function Mild aspiration risk   CHL IP TREATMENT RECOMMENDATION 08/22/2016 Treatment Recommendations No treatment recommended at this time   No flowsheet data found. CHL IP DIET RECOMMENDATION 08/22/2016 SLP Diet Recommendations Regular solids;Thin liquid Liquid Administration via Cup;Straw Medication Administration Whole meds with puree Compensations Slow rate;Small sips/bites Postural Changes Seated upright at 90 degrees   CHL IP OTHER RECOMMENDATIONS 08/22/2016 Recommended Consults -- Oral Care Recommendations Oral care BID Other Recommendations --   CHL IP FOLLOW UP RECOMMENDATIONS 08/22/2016 Follow up Recommendations None   No flowsheet data found.     CHL IP ORAL PHASE 08/22/2016 Oral Phase Impaired Oral - Pudding Teaspoon -- Oral - Pudding Cup -- Oral - Honey Teaspoon -- Oral - Honey Cup -- Oral - Nectar Teaspoon -- Oral - Nectar Cup -- Oral - Nectar Straw -- Oral - Thin Teaspoon NT Oral - Thin Cup Lingual/palatal residue Oral - Thin Straw Lingual/palatal residue Oral - Puree -- Oral - Mech Soft -- Oral - Regular Delayed oral transit Oral - Multi-Consistency -- Oral - Pill -- Oral Phase - Comment --  CHL IP PHARYNGEAL PHASE 08/22/2016 Pharyngeal Phase Impaired Pharyngeal- Pudding Teaspoon -- Pharyngeal -- Pharyngeal- Pudding Cup -- Pharyngeal -- Pharyngeal- Honey Teaspoon -- Pharyngeal -- Pharyngeal- Honey Cup -- Pharyngeal -- Pharyngeal- Nectar Teaspoon -- Pharyngeal -- Pharyngeal- Nectar  Cup -- Pharyngeal -- Pharyngeal- Nectar Straw -- Pharyngeal -- Pharyngeal- Thin  Teaspoon -- Pharyngeal -- Pharyngeal- Thin Cup Delayed swallow initiation-vallecula Pharyngeal -- Pharyngeal- Thin Straw (No Data) Pharyngeal -- Pharyngeal- Puree -- Pharyngeal -- Pharyngeal- Mechanical Soft -- Pharyngeal -- Pharyngeal- Regular WFL Pharyngeal -- Pharyngeal- Multi-consistency -- Pharyngeal -- Pharyngeal- Pill Penetration/Aspiration during swallow Pharyngeal Material enters airway, remains ABOVE vocal cords then ejected out Pharyngeal Comment --  CHL IP CERVICAL ESOPHAGEAL PHASE 08/22/2016 Cervical Esophageal Phase WFL Pudding Teaspoon -- Pudding Cup -- Honey Teaspoon -- Honey Cup -- Nectar Teaspoon -- Nectar Cup -- Nectar Straw -- Thin Teaspoon -- Thin Cup -- Thin Straw -- Puree -- Mechanical Soft -- Regular -- Multi-consistency -- Pill -- Cervical Esophageal Comment -- No flowsheet data found. [place] Siren 08/22/2016, 2:55 PM Orbie Pyo Colvin Caroli.Ed CCC-SLP Pager [phone]               Recent Labs  08/23/16 0642  WBC 11.0*  HGB 11.9*  HCT 34.8*  PLT 181    Recent Labs  08/23/16 0642  NA 136  K 3.1*  CL 102  GLUCOSE 118*  BUN 5*  CREATININE 0.70  CALCIUM 8.9   CBG (last 3)  No results for input(s): GLUCAP in the last 72 hours.  Wt Readings from Last 3 Encounters:  08/23/16 69 kg (152 lb 1.9 oz)  08/20/16 67.2 kg (148 lb 1.6 oz)    Physical Exam:  BP (!) 144/100 (BP Location: Left Arm)   Pulse 78   Temp 98.9 F (37.2 C) (Oral)   Resp 16   Ht 5' 4 MRN: [id number] Date of Birth: October 07, 1928 Today's Date: 08/22/2016 Time: SLP Start Time (ACUTE ONLY): 1300-SLP Stop Time (ACUTE ONLY): 1315 SLP Time Calculation (min) (ACUTE ONLY): 15 min Past Medical History: Past Medical History: Diagnosis Date . Blood dyscrasia  . CHF (congestive heart failure) ([place])  . COPD (chronic obstructive pulmonary disease) ([place])  . Heart murmur  . Hypertension  . Hypothyroidism  Past Surgical History: Past Surgical History: Procedure Laterality Date . CORONARY ANGIOPLASTY   . CORONARY ARTERY BYPASS GRAFT  1999 . KNEE ARTHROSCOPY   . LUMBAR SPINE SURGERY   . TONSILLECTOMY   HPI: Yvonne R Jonesis a 83 y.o.femalewith medical history significant for COPD, HTN, CAD s/p CABG in 1995,with stents, TIA symptoms 6 months ago, HLD, systolic CHF presenting with right sided weakness and dysphasia. MRI acute nonhemorrhagic left paracentral pontine infarct. Remote tiny left caudate head infarct. Remote tiny right cerebellar infarct. Mild chronic microvascular changes. CXR no acute abnormality seen. MD note the patient also with C3-C4 protrusion with moderate spinal stenosis with mild to moderate cord flattening. No Data Recorded Assessment / Plan / Recommendation CHL IP CLINICAL IMPRESSIONS 08/22/2016 Therapy Diagnosis Mild oral phase dysphagia;Mild pharyngeal phase dysphagia Clinical Impression Pt  exhibits min-mild oral and pharygneal dysphagia marked by minimally prolonged mastication, delayed transit and decreased oral cohesion (resulting in premature spill x 1 to pyriform sinuses). Flash laryngeal penetration of thin during pill trial also caused by reduced oral control/cohesion with mixed consistencies. Regular texture and thin liquids recommended, straws allowed, pills whole in applesauce. No further ST needed.  Impact on safety and function Mild aspiration risk   CHL IP TREATMENT RECOMMENDATION 08/22/2016 Treatment Recommendations No treatment recommended at this time   No flowsheet data found. CHL IP DIET RECOMMENDATION 08/22/2016 SLP Diet Recommendations Regular solids;Thin liquid Liquid Administration via Cup;Straw Medication Administration Whole meds with puree Compensations Slow rate;Small sips/bites Postural Changes Seated upright at 90 degrees   CHL IP OTHER RECOMMENDATIONS 08/22/2016 Recommended Consults -- Oral Care Recommendations Oral care BID Other Recommendations --   CHL IP FOLLOW UP RECOMMENDATIONS 08/22/2016 Follow up Recommendations None   No flowsheet data found.     CHL IP ORAL PHASE 08/22/2016 Oral Phase Impaired Oral - Pudding Teaspoon -- Oral - Pudding Cup -- Oral - Honey Teaspoon -- Oral - Honey Cup -- Oral - Nectar Teaspoon -- Oral - Nectar Cup -- Oral - Nectar Straw -- Oral - Thin Teaspoon NT Oral - Thin Cup Lingual/palatal residue Oral - Thin Straw Lingual/palatal residue Oral - Puree -- Oral - Mech Soft -- Oral - Regular Delayed oral transit Oral - Multi-Consistency -- Oral - Pill -- Oral Phase - Comment --  CHL IP PHARYNGEAL PHASE 08/22/2016 Pharyngeal Phase Impaired Pharyngeal- Pudding Teaspoon -- Pharyngeal -- Pharyngeal- Pudding Cup -- Pharyngeal -- Pharyngeal- Honey Teaspoon -- Pharyngeal -- Pharyngeal- Honey Cup -- Pharyngeal -- Pharyngeal- Nectar Teaspoon -- Pharyngeal -- Pharyngeal- Nectar  Cup -- Pharyngeal -- Pharyngeal- Nectar Straw -- Pharyngeal -- Pharyngeal- Thin  Teaspoon -- Pharyngeal -- Pharyngeal- Thin Cup Delayed swallow initiation-vallecula Pharyngeal -- Pharyngeal- Thin Straw (No Data) Pharyngeal -- Pharyngeal- Puree -- Pharyngeal -- Pharyngeal- Mechanical Soft -- Pharyngeal -- Pharyngeal- Regular WFL Pharyngeal -- Pharyngeal- Multi-consistency -- Pharyngeal -- Pharyngeal- Pill Penetration/Aspiration during swallow Pharyngeal Material enters airway, remains ABOVE vocal cords then ejected out Pharyngeal Comment --  CHL IP CERVICAL ESOPHAGEAL PHASE 08/22/2016 Cervical Esophageal Phase WFL Pudding Teaspoon -- Pudding Cup -- Honey Teaspoon -- Honey Cup -- Nectar Teaspoon -- Nectar Cup -- Nectar Straw -- Thin Teaspoon -- Thin Cup -- Thin Straw -- Puree -- Mechanical Soft -- Regular -- Multi-consistency -- Pill -- Cervical Esophageal Comment -- No flowsheet data found. [place] Siren 08/22/2016, 2:55 PM Orbie Pyo Colvin Caroli.Ed CCC-SLP Pager [phone]               Recent Labs  08/23/16 0642  WBC 11.0*  HGB 11.9*  HCT 34.8*  PLT 181    Recent Labs  08/23/16 0642  NA 136  K 3.1*  CL 102  GLUCOSE 118*  BUN 5*  CREATININE 0.70  CALCIUM 8.9   CBG (last 3)  No results for input(s): GLUCAP in the last 72 hours.  Wt Readings from Last 3 Encounters:  08/23/16 69 kg (152 lb 1.9 oz)  08/20/16 67.2 kg (148 lb 1.6 oz)    Physical Exam:  BP (!) 144/100 (BP Location: Left Arm)   Pulse 78   Temp 98.9 F (37.2 C) (Oral)   Resp 16   Ht 5\' 4"  (1.626 m)   Wt 69 kg (152 lb 1.9 oz)   SpO2 100%   BMI 26.11 kg/m  Constitutional: She appears well-developedand well-nourished.  HENT: Normocephalicand atraumatic.  Eyes: EOMI. No discharge.  Cardiovascular: Normal rateand regular rhythm. No JVD.  Respiratory: Effort normal. Clear GI: Soft. Bowel sounds are normal.   Musculoskeletal: No edema. +Edema.   Neurological: She is alert.   Right facial weakness Speech with ataxic dysarthria.  Oriented to self only.  Motor: RUE: 2/5 proximal to  distal RLE: 1/5 proximal to distal  Skin: Skin is warmand dry. Intact.   Assessment/Plan: 1. Functional deficits secondary to Left paramedian pontine infarct which require 3+ hours per day of interdisciplinary therapy in a comprehensive inpatient rehab setting. Physiatrist is providing close team supervision and 24 hour management of active medical problems listed below. Physiatrist and rehab team continue to assess barriers to discharge/monitor patient progress toward functional and medical goals.  Function:  Bathing Bathing position   Position: Shower  Bathing parts Body parts bathed by patient: Right arm, Chest, Abdomen, Front perineal area, Right upper leg, Left upper leg Body parts bathed by helper: Left arm, Buttocks, Right lower leg, Left lower leg, Back  Bathing assist Assist Level: Touching or steadying assistance(Pt > 75%)      Upper Body Dressing/Undressing Upper body dressing   What is the patient wearing?: Hospital gown                Upper body assist Assist Level: Touching or steadying assistance(Pt > 75%)      Lower Body Dressing/Undressing Lower body dressing   What is the patient wearing?: Non-skid slipper socks (brief donned as underwear- total A)           Non-skid slipper socks- Performed by helper: Don/doff right sock, Don/doff left sock  Lower body assist Assist for lower body dressing: Touching or steadying assistance (Pt > 75%)      Toileting Toileting   Toileting steps completed by patient: Performs perineal hygiene Toileting steps completed by helper: Adjust clothing prior to toileting, Adjust clothing after toileting Toileting Assistive Devices: Grab bar or rail  Toileting assist Assist level: Touching or steadying assistance (Pt.75%)   Transfers Chair/bed transfer     Chair/bed transfer assist level: Maximal assist (Pt 25 - 49%/lift and lower) Chair/bed transfer assistive device: Theme park manager          Cognition Comprehension Comprehension assist level: Follows basic conversation/direction with extra time/assistive device  Expression Expression assist level: Expresses basic needs/ideas: With extra time/assistive device  Social Interaction Social Interaction assist level: Interacts appropriately 75 - 89% of the time - Needs redirection for appropriate language or to initiate interaction.  Problem Solving Problem solving assist level: Solves basic 75 - 89% of the time/requires cueing 10 - 24% of the time  Memory Memory assist level: Recognizes or recalls 75 - 89% of the time/requires cueing 10 - 24% of the time     Medical Problem List and Plan: 1. Right hemiparesis secondary to Left paramedian pontine infarct  Begin CIR  Bracing ordered  Fluoxetine started 1/14 2. DVT Prophylaxis/Anticoagulation: Pharmaceutical: Lovenox 3. Chronic back pain/spasms/Pain Management: Continue flexeril prn 4. Mood: Stable. LCSW to follow for evaluation and support.  5. Neuropsych: This patient is notcapable of making decisions on herown behalf. 6. Skin/Wound Care: Routine pressure relief measures. Maintain adequate nutrition and hydration status.  7. Fluids/Electrolytes/Nutrition: Monitor I/O  Diet advanced to regular.  8. HTN: Monitor BP bid  Continue lisinopril   Monitor with increased activity 9. 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  10. H/o asthma/Interstitial lung disease: Monitor respiratory status with increase in activity. albuterol MDI prn 11 Chronic systolic CHF: On ASA, metoprolol and zocor. Monitor for signs of overload. Heart healthy/Low salt restrictions. Discontinued IVF to avoid fluid overload.  Filed Weights   08/22/16 1842 08/23/16 0518  Weight: 68.2 kg (150 lb 6.4 oz) 69 kg (152 lb 1.9 oz)   12. Chronic constipation: Resumed miralax.  13. GERD: Wth chronic nausea--continue pepcid for now. Small  portions at meals.   May need PPI.  14. Leukocytosis  WBCs 11.0 on 1/12  Cont to monitor 15. ABLA  Hb 11.9 on 1/12  Cont to monitor 16. Hypokalemia  K+ 3.1 on 1/12  Supplementing until 11/14  Con to monitor 17. Sleep disturbance  Trazodone PRN  LOS (Days) 1 A FACE TO FACE EVALUATION WAS PERFORMED  Ankit Lorie Phenix 08/23/2016 1:20 PM

## 2016-08-23 NOTE — IPOC Note (Signed)
Overall Plan of Care [facility]) Patient Details Name: Yvonne Parrish MRN: [id number] DOB: [birth year]  Admitting Diagnosis: CVA  Hospital Problems: Active Problems:   Ischemic stroke ([place])   Right hemiparesis ([place])   Gait disturbance, post-stroke   Left pontine stroke ([facility])   Benign essential HTN   Chronic systolic CHF (congestive heart failure) ([facility])   Acute blood loss anemia   Hypokalemia   Sleep disturbance     Functional Problem List: Nursing Behavior, Bladder, Medication Management, Motor, Nutrition, Skin Integrity, Safety  PT Balance, Behavior, Endurance, Motor, Perception, Safety, Sensory  OT Balance, Safety, Behavior, Sensory, Skin Integrity, Cognition, Edema, Endurance, Motor, Nutrition, Pain, Perception  SLP    TR         Basic ADL's: OT Eating, Grooming, Bathing, Dressing, Toileting     Advanced  ADL's: OT       Transfers: PT Bed Mobility, Bed to Chair, Car, [employer], Futures trader, Tub/Shower     Locomotion: PT Ambulation, Emergency planning/management officer, Stairs     Additional Impairments: OT Fuctional Use of Upper Extremity  SLP Swallowing, Communication expression    TR      Anticipated Outcomes Item Anticipated Outcome  Self Feeding mod I   Swallowing  Mod I   Basic self-care  supervision to min A   Toileting  min A    Bathroom Transfers min A   Bowel/Bladder  Cont. of Bowel. Incont sometimes of bladder. Wears a pad at home.  Transfers  Min assist with LRAD   Locomotion  Min assist with LRAD   Communication  Supervision  Cognition     Pain  No pain at this time. Can have pain in Right knee.  Safety/Judgment  Weakness in RLE and RUE. Mod assist with 2 persons and stedy. Pt uses walker at home and lives by herself.   Therapy Plan: PT Intensity: Minimum of 1-2 x/day ,45 to 90 minutes PT Frequency: 5 out of 7 days PT Duration Estimated Length of Stay: 20-24days  OT Intensity: Minimum of 1-2 x/day, 45 to 90 minutes OT Frequency: 5 out of 7  days OT Duration/Estimated Length of Stay: 3  weeks SLP Intensity: Minumum of 1-2 x/day, 30 to 90 minutes SLP Frequency: 3 to 5 out of 7 days SLP Duration/Estimated Length of Stay: 3 weeks       Team Interventions: Nursing Interventions Bladder Management, Patient/Family Education, Skin Care/Wound Management, Discharge Planning, Medication Management  PT interventions Ambulation/gait training, Balance/vestibular training, Cognitive remediation/compensation, Community reintegration, Discharge planning, Disease management/prevention, DME/adaptive equipment instruction, Functional electrical stimulation, Functional mobility training, Neuromuscular re-education, Pain management, Patient/family education, Psychosocial support, Splinting/orthotics, UE/LE Strength taining/ROM, Therapeutic Activities, Therapeutic Exercise, Stair training, UE/LE Coordination activities, Visual/perceptual remediation/compensation, Wheelchair propulsion/positioning  OT Interventions Balance/vestibular training, Discharge planning, Pain management, Self Care/advanced ADL retraining, Therapeutic Activities, UE/LE Coordination activities, Visual/perceptual remediation/compensation, Therapeutic Exercise, Skin care/wound managment, Patient/family education, Functional mobility training, Disease mangement/prevention, Cognitive remediation/compensation, Academic librarian, Engineer, drilling, Neuromuscular re-education, Psychosocial support, UE/LE Strength taining/ROM, Wheelchair propulsion/positioning, Splinting/orthotics, Functional electrical stimulation  SLP Interventions Dysphagia/aspiration precaution training, Speech/Language facilitation, Patient/family education, Internal/external aids  TR Interventions    SW/CM Interventions Discharge Planning, Psychosocial Support, Patient/Family Education    Team Discharge Planning: Destination: PT-Home ,OT- Home , SLP-Home Projected Follow-up: PT-Home health PT, OT-   Outpatient OT, Home health OT, SLP-24 hour supervision/assistance Projected Equipment Needs: PT-Wheelchair (measurements), Wheelchair cushion (measurements), To be determined, OT- To be determined, SLP-To be determined Equipment Details: PT- , OT-  Patient/family involved in  discharge planning: PT- Patient, Family member/caregiver,  OT-Patient, Family member/caregiver, SLP-Patient, Family member/caregiver  MD ELOS: 18-22 days. Medical Rehab Prognosis:  Good  Assessment:  83 y.o. RH female with history of HTN, interstitial lung disease, CAD s/p CABG, chronic systolic CHF, Chronic back pain/spasma, TIA last summer, who was admitted on 08/21/15 with right sided weakness and difficulty speaking. CT perfusion scan negative. CTA head/neck revealed 3.9 cm ascending thoracic aorta, moderate narrowing of right SA (complex plaque) and R-VA and moderate narrowing of proximal R-PCA. MRI brain done revealing small left paracentral pontine, C3-C4 protrusion with moderate stenosis and cord flattening as well as intracranial atherosclerotic changes involving posterior circulation. Carotid dopplers without significant ICA stenosis. Cardiac echo with EF 55-60%, severely calcified mitral and aortic valve, moderate AVR and moderate pulmonary HTN. Dr. Leonie Man felt that infarct secondary to small vessel disease. Patient declined Plavix and was started on Aggrenox for secondary stroke prevention. Diet advanced to regular textures, thin liquids. Patient with resultant right sided weakness with sensory deficits and right knee instability, posterior lean, dysphagia and expressive deficits. Will set goals for Min A/Supervision with most tasks with PT/OT and Mod I with for swallowing tasks with SLP.  See Team Conference Notes for weekly updates to the plan of care

## 2016-08-23 NOTE — Evaluation (Signed)
Speech Language Pathology Assessment and Plan  Patient Details  Name: Yvonne Parrish MRN: [id number] Date of Birth: 09-14-1928  SLP Diagnosis: Aphasia;Dysarthria;Dysphagia  Rehab Potential: Good ELOS: 3 weeks    Today's Date: 08/23/2016 SLP Individual Time: 1230-1330 SLP Individual Time Calculation (min): 60 min    Problem List: Patient Active Problem List   Diagnosis Date Noted  . Benign essential HTN   . Chronic systolic CHF (congestive heart failure) ([place])   . Acute blood loss anemia   . Hypokalemia   . Sleep disturbance   . Gait disturbance, post-stroke 08/22/2016  . Left pontine stroke ([place]) 08/22/2016  . Slurred speech   . Chronic obstructive pulmonary disease ([place])   . Coronary artery disease involving coronary bypass graft of native heart without angina pectoris   . Leukocytosis   . Hyperglycemia   . Right hemiparesis ([place])   . COPD exacerbation ([place]) 08/20/2016  . Aphasia 08/20/2016  . Asthma without status asthmaticus 08/20/2016  . CHF (congestive heart failure) ([place]) 08/20/2016  . Hyperlipidemia, unspecified 08/20/2016  . Hypertension 08/20/2016  . Myelodysplastic syndrome ([place]) 08/20/2016  . Osteoporosis, post-menopausal 08/20/2016  . Stroke (cerebrum) ([place]) 08/20/2016  . Ischemic stroke ([place])   . Cardiomyopathy, dilated ([place]) 01/10/2016  . H/O cardiac catheterization 01/10/2016  . Chronic back pain 11/01/2014  . History of coronary artery bypass graft 11/01/2014  . Acquired hypothyroidism 07/05/2014   Past Medical History:  Past Medical History:  Diagnosis Date  . Asthma in child    question of interstitial lung disease  . CHF (congestive heart failure) ([place])   . Chronic back pain   . Chronic pain of right knee    for past 30 years  . Heart murmur   . History of right foot drop   . Hypertension   . Hypothyroidism   . Myelodysplastic syndrome [facility])    Past Surgical History:  Past Surgical History:  Procedure Laterality Date  . CORONARY  ANGIOPLASTY    . CORONARY ARTERY BYPASS GRAFT  1999  . KNEE ARTHROSCOPY    . knee fracture     Right knee pinning in '90's   . LUMBAR SPINE SURGERY    . TONSILLECTOMY      Assessment / Plan / Recommendation Clinical Impression Yvonne R Jonesis a 83 y.o. RH female with history of HTN, interstitial lung disease, CAD s/p CABG, chronic systolic CHF, Chronic back pain/spasma, TIA last summer, who was admitted on 08/21/15 with right sided weakness and difficulty speaking. CT perfusion scan negative. CTA head/neck revealed 3.9 cm ascending thoracic aorta, moderate narrowing of right SA (complex plaque) and R-VA and moderate narrowing of proximal R-PCA. MRI brain done revealing small left paracentral pontine, C3-C4 protrusion with moderate stenosis and cord flattening as well as intracranial atherosclerotic changes involving posterior circulation. Carotid dopplers without significant ICA stenosis. Cardiac echo with EF 55-60%, severely calcified mitral and aortic valve, moderate AVR and moderate pulmonary HTN. Dr. Leonie Man felt that infarct secondary to small vessel disease. Patient declined Plavix and was started on Aggrenox for secondary stroke prevention. MBS done today and diet advanced to regular textures, thin liquids. Patient with resultant right sided weakness with sensory deficits and right knee instability, posterior lean, dysphagia and expressive deficits. CIR recommended for follow up therapy. Of note, pt with MBS on 08/22/16 with min-mild oral and pharygneal dysphagia marked by minimally prolonged mastication, delayed transit and decreased oral cohesion (resulting in premature spill x 1 to pyriform sinuses). Flash laryngeal penetration of thin during  pill trial also caused by reduced oral control/cohesion with mixed consistencies. Regular texture and thin liquids recommended, straws allowed, pills whole in applesauce.   Pt admitted to CIR on 08/22/16 with Bedside Swallow Evaluation and  Speech-Language Evaluation completed on 08/23/16. Pt continues to demonstrate min to mild oral dysphagia marked by slight residue in right buccal cavity. Pt reports that a pill became stuck on her right and she didn't realize until "sometime passed." I would recommend intermittent supervision with meals to ensure complete oral clearing. Pt also presents with mild expressive aphasia c/b semantic parapahasias that are more evident during unstructured responses. She states that she has decreased desire to speak because she has noticed that people can't understand her very well. Her ability to communicate is further impacted by flaccid dsyarthria that is further excerbated by fatigue. Pt is intelligible at the sentence level when speaking "deliberately." These deficits impact the pt's ability to functionally communicate. Pt would benefit from skilled SLP interventioni in order to maximize her functional independence prior to discharge. Anticipate that pt will require 24 hour supervision at time of discharge.    Skilled Therapeutic Interventions          Bedside Swallow Evaluation and Speech-Language completed with results, goals and POC shared with pt, daughter and son-in-law. General swallow strategies and speech intelligibility strategies demonstrated and provided to pt. She voiced understanding but suspect that she will need Min A reminders. Recommend intermittent supervision with PO intake d/t right-sided weakness.    SLP Assessment  Patient will need skilled Speech Lanaguage Pathology Services during CIR admission    Recommendations  SLP Diet Recommendations: Age appropriate regular solids;Thin Liquid Administration via: Cup;Straw Medication Administration: Whole meds with puree Supervision: Intermittent supervision to cue for compensatory strategies;Staff to assist with self feeding Compensations: Slow rate;Small sips/bites Postural Changes and/or Swallow Maneuvers: Seated upright 90 degrees Oral Care  Recommendations: Oral care BID Patient destination: Home Follow up Recommendations: 24 hour supervision/assistance Equipment Recommended: To be determined    SLP Frequency 3 to 5 out of 7 days   SLP Duration  SLP Intensity  SLP Treatment/Interventions 3 weeks  Minumum of 1-2 x/day, 30 to 90 minutes  Dysphagia/aspiration precaution training;Speech/Language facilitation;Patient/family education;Internal/external aids    Pain Pain Assessment Pain Assessment: No/denies pain  Prior Functioning Cognitive/Linguistic Baseline: Within functional limits (Although daughter reports pt expressed word finding deficits for 3 to 4 month prior to admission) Type of Home: House  Lives With: Alone Available Help at Discharge: Family;Available PRN/intermittently Vocation: Retired  Function:  Eating Eating   Modified Consistency Diet: No Eating Assist Level: More than reasonable amount of time;Set up assist for   Eating Set Up Assist For: Opening containers       Cognition Comprehension Comprehension assist level: Follows basic conversation/direction with no assist  Expression   Expression assist level: Expresses complex ideas: With extra time/assistive device  Social Interaction Social Interaction assist level: Interacts appropriately 75 - 89% of the time - Needs redirection for appropriate language or to initiate interaction.  Problem Solving Problem solving assist level: Solves basic 90% of the time/requires cueing < 10% of the time  Memory Memory assist level: Requires cues to use assistive device   Short Term Goals: Week 1: SLP Short Term Goal 1 (Week 1): Pt will utilize speech intelligibility strategies at the sentence level with Min A verbal cues to achieve >90% intelligibility.  SLP Short Term Goal 2 (Week 1): Pt will demonstrate self-monitoring and correction with speech intelligibility at the  sentence level with Min A verbal cues.  SLP Short Term Goal 3 (Week 1): Pt will consume  current diet without overt s/s of aspiration with supervision cues for use of swallowing strategies.  SLP Short Term Goal 4 (Week 1): Pt will utilize word-finding strategies during structured language activies with Mod A verbal cues.   Refer to Care Plan for Long Term Goals  Recommendations for other services: None   Discharge Criteria: Patient will be discharged from SLP if patient refuses treatment 3 consecutive times without medical reason, if treatment goals not met, if there is a change in medical status, if patient makes no progress towards goals or if patient is discharged from hospital.  The above assessment, treatment plan, treatment alternatives and goals were discussed and mutually agreed upon: by patient and by family  Vermelle Cammarata 08/23/2016, 5:06 PM

## 2016-08-23 NOTE — Evaluation (Signed)
Physical Therapy Assessment and Plan  Patient Details  Name: Yvonne Parrish MRN: [id number] Date of Birth: 02/04/1929  PT Diagnosis: Abnormal posture, Abnormality of gait, Hemiplegia dominant and Muscle weakness Rehab Potential: Good ELOS: 20-24days    Today's Date: 08/23/2016 PT Individual Time: 1000-1100 PT Individual Time Calculation (min): 60 min     Problem List: Patient Active Problem List   Diagnosis Date Noted  . Benign essential HTN   . Chronic systolic CHF (congestive heart failure) ([place])   . Acute blood loss anemia   . Hypokalemia   . Sleep disturbance   . Gait disturbance, post-stroke 08/22/2016  . Left pontine stroke ([place]) 08/22/2016  . Slurred speech   . Chronic obstructive pulmonary disease ([place])   . Coronary artery disease involving coronary bypass graft of native heart without angina pectoris   . Leukocytosis   . Hyperglycemia   . Right hemiparesis ([place])   . COPD exacerbation ([place]) 08/20/2016  . Aphasia 08/20/2016  . Asthma without status asthmaticus 08/20/2016  . CHF (congestive heart failure) ([place]) 08/20/2016  . Hyperlipidemia, unspecified 08/20/2016  . Hypertension 08/20/2016  . Myelodysplastic syndrome ([place]) 08/20/2016  . Osteoporosis, post-menopausal 08/20/2016  . Stroke (cerebrum) ([place]) 08/20/2016  . Ischemic stroke ([place])   . Cardiomyopathy, dilated ([place]) 01/10/2016  . H/O cardiac catheterization 01/10/2016  . Chronic back pain 11/01/2014  . History of coronary artery bypass graft 11/01/2014  . Acquired hypothyroidism 07/05/2014    Past Medical History:  Past Medical History:  Diagnosis Date  . Asthma in child    question of interstitial lung disease  . CHF (congestive heart failure) ([place])   . Chronic back pain   . Chronic pain of right knee    for past 30 years  . Heart murmur   . History of right foot drop   . Hypertension   . Hypothyroidism   . Myelodysplastic syndrome [facility])    Past Surgical History:  Past Surgical History:   Procedure Laterality Date  . CORONARY ANGIOPLASTY    . CORONARY ARTERY BYPASS GRAFT  1999  . KNEE ARTHROSCOPY    . knee fracture     Right knee pinning in '90's   . LUMBAR SPINE SURGERY    . TONSILLECTOMY      Assessment & Plan Clinical Impression: Patient is a  83 y.o. RH female with history of HTN, interstitial lung disease, CAD s/p CABG, chronic systolic CHF, Chronic back pain/spasma, TIA last summer, who was admitted on 08/21/15 with right sided weakness and difficulty speaking. CT perfusion scan negative. CTA head/neck revealed 3.9 cm ascending thoracic aorta, moderate narrowing of right SA (complex plaque) and R-VA and moderate narrowing of proximal R-PCA. MRI brain done revealing small left paracentral pontine, C3-C4 protrusion with moderate stenosis and cord flattening as well as intracranial atherosclerotic changes involving posterior circulation. Carotid dopplers without significant ICA stenosis. Cardiac echo with EF 55-60%, severely calcified mitral and aortic valve, moderate AVR and moderate pulmonary HTN. Dr. Leonie Man felt that infarct secondary to small vessel disease. Patient declined Plavix and was started on Aggrenox for secondary stroke prevention. MBS done today and diet advanced to regular textures, thin liquids. Patient with resultant right sided weakness with sensory deficits and right knee instability, posterior lean, dysphagia and expressive deficits.  Patient transferred to CIR on 08/22/2016 .   Patient currently requires max with mobility secondary to muscle weakness, decreased cardiorespiratoy endurance, abnormal tone, unbalanced muscle activation and decreased coordination and decreased standing balance, decreased postural control, hemiplegia  and decreased balance strategies.  Prior to hospitalization, patient was modified independent  with mobility and lived with Alone in a House home.  Home access is 2Stairs to enter.  Patient will benefit from skilled PT intervention  to maximize safe functional mobility, minimize fall risk and decrease caregiver burden for planned discharge home with 24 hour assist.  Anticipate patient will benefit from follow up [facility] at discharge.  PT - End of Session Activity Tolerance: Tolerates 10 - 20 min activity with multiple rests Endurance Deficit: Yes PT Assessment Rehab Potential (ACUTE/IP ONLY): Good Barriers to Discharge: Inaccessible home environment;Decreased caregiver support PT Patient demonstrates impairments in the following area(s): Balance;Behavior;Endurance;Motor;Perception;Safety;Sensory PT Transfers Functional Problem(s): Bed Mobility;Bed to Chair;Car;Furniture;Floor PT Locomotion Functional Problem(s): Ambulation;Wheelchair Mobility;Stairs PT Plan PT Intensity: Minimum of 1-2 x/day ,45 to 90 minutes PT Frequency: 5 out of 7 days PT Duration Estimated Length of Stay: 20-24days  PT Treatment/Interventions: Ambulation/gait training;Balance/vestibular training;Cognitive remediation/compensation;Community reintegration;Discharge planning;Disease management/prevention;DME/adaptive equipment instruction;Functional electrical stimulation;Functional mobility training;Neuromuscular re-education;Pain management;Patient/family education;Psychosocial support;Splinting/orthotics;UE/LE Strength taining/ROM;Therapeutic Activities;Therapeutic Exercise;Stair training;UE/LE Coordination activities;Visual/perceptual remediation/compensation;Wheelchair propulsion/positioning PT Transfers Anticipated Outcome(s): Min assist with LRAD  PT Locomotion Anticipated Outcome(s): Min assist with LRAD  PT Recommendation Follow Up Recommendations: Home health PT Patient destination: Home Equipment Recommended: Wheelchair (measurements);Wheelchair cushion (measurements);To be determined      Skilled Therapeutic Intervention PT instructed patient in evaluation and initiated treatment intervention; see below for results.  Patient at overall max  assist for all transfers including squt pivot/stand pivot with PT required to block RLE to prevent buckling. Pt educated patient in [place], treatment interventions, rehab goals and discharge recommendations with family. paitent left sitting in [facility] with family present and all needs met.   PT Evaluation Precautions/Restrictions Restrictions Weight Bearing Restrictions: No General   Vital SignsTherapy Vitals Temp: 99.4 F (37.4 C) Temp Source: Oral Pulse Rate: 72 Resp: 16 BP: (!) 145/68 Patient Position (if appropriate): Lying Oxygen Therapy SpO2: 94 % O2 Device: Not Delivered Pain Pain Assessment Pain Assessment: No/denies pain Home Living/Prior Functioning Home Living Living Arrangements: Alone Available Help at Discharge: Family;Available PRN/intermittently Type of Home: House  Lives With: Alone Prior Function Vocation: Retired Vision/Perception     To be assessed further in functional context  Cognition Overall Cognitive Status: Impaired/Different from baseline Arousal/Alertness: Awake/alert Orientation Level: Oriented X4 Attention: Sustained;Selective Sustained Attention: Appears intact Selective Attention: Impaired Selective Attention Impairment: Functional basic Memory: Impaired Memory Impairment: Decreased recall of new information Awareness: Impaired Awareness Impairment: Emergent impairment;Intellectual impairment Problem Solving: Impaired Problem Solving Impairment: Functional basic Safety/Judgment: Appears intact Sensation Sensation Light Touch: Impaired Detail Light Touch Impaired Details: Impaired RUE;Impaired RLE Stereognosis: Appears Intact Hot/Cold: Impaired Detail Hot/Cold Impaired Details: Impaired RUE Proprioception: Impaired Detail Proprioception Impaired Details: Impaired RUE;Impaired RLE Coordination Gross Motor Movements are Fluid and Coordinated: No Fine Motor Movements are Fluid and Coordinated: No Coordination and Movement Description:  impaired coordination on the R.  Motor  Motor Motor: Hemiplegia;Abnormal postural alignment and control Motor - Skilled Clinical Observations: R sided Hemi plegia  Mobility Bed Mobility Bed Mobility: Supine to Sit;Rolling Right;Rolling Left;Sit to Supine Rolling Right: 3: Mod assist Rolling Right Details: Verbal cues for technique;Manual facilitation for weight shifting;Verbal cues for precautions/safety;Verbal cues for sequencing;Verbal cues for safe use of DME/AE Rolling Left: 2: Max assist Rolling Left Details: Verbal cues for technique;Verbal cues for precautions/safety;Verbal cues for safe use of DME/AE;Verbal cues for gait pattern Supine to Sit: 2: Max assist Supine to Sit Details: Manual facilitation for weight bearing;Manual facilitation for weight shifting;Manual  facilitation for placement;Verbal cues for precautions/safety Sit to Supine: 3: Mod assist Sit to Supine - Details: Manual facilitation for placement;Verbal cues for technique;Manual facilitation for weight shifting;Verbal cues for sequencing;Verbal cues for precautions/safety;Visual cues/gestures for sequencing Transfers Transfers: Yes Sit to Stand: 2: Max assist Stand to Sit: 2: Max assist Stand Pivot Transfers: 2: Max Psychologist, occupational Details: Verbal cues for sequencing;Verbal cues for technique;Verbal cues for precautions/safety;Manual facilitation for weight shifting;Verbal cues for safe use of DME/AE;Manual facilitation for placement;Verbal cues for gait pattern Squat Pivot Transfers: 2: Max Risk manager Details: Verbal cues for sequencing;Verbal cues for technique;Verbal cues for precautions/safety;Manual facilitation for weight shifting;Manual facilitation for placement Locomotion  Ambulation Ambulation: Yes Ambulation/Gait Assistance: 2: Max assist (+2 for WC follow) Assistive device: Other (Comment) (rail in hall) Gait Gait: Yes Gait Pattern: Impaired Gait Pattern: Step-to  pattern;Decreased dorsiflexion - right Stairs / Additional Locomotion Stairs: No Wheelchair Mobility Wheelchair Mobility: Yes Wheelchair Assistance: 2: Max Technical sales engineer Details: Verbal cues for sequencing;Verbal cues for technique;Verbal cues for precautions/safety;Verbal cues for safe use of DME/AE;Tactile cues for placement Wheelchair Propulsion: Left lower extremity;Left upper extremity Wheelchair Parts Management: Needs assistance Distance: 79f   Trunk/Postural Assessment  Cervical Assessment Cervical Assessment: Exceptions to [facility](forward flexed) Thoracic Assessment Thoracic Assessment: Exceptions to WS[facility](slightly kyphotic. poor rotation) Lumbar Assessment Lumbar Assessment: Exceptions to WRockwall [facility](posterior pelvic tilt) Postural Control Postural Control: Deficits on evaluation Trunk Control: good Righting Reactions: deleayed  Balance Balance Balance Assessed: Yes Static Sitting Balance Static Sitting - Balance Support: Feet supported Static Sitting - Level of Assistance: 5: Stand by assistance Dynamic Sitting Balance Dynamic Sitting - Balance Support: Feet unsupported;No upper extremity supported;During functional activity Dynamic Sitting - Level of Assistance: 4: Min assist Static Standing Balance Static Standing - Balance Support: During functional activity;Left upper extremity supported Static Standing - Level of Assistance: 3: Mod assist;2: Max assist Dynamic Standing Balance Dynamic Standing - Balance Support: Left upper extremity supported Dynamic Standing - Level of Assistance: 2: Max assist (with functional movement) Extremity Assessment      RLE Assessment RLE Assessment: Exceptions to WSpectrum [facility] CampusRLE AROM (degrees) RLE Overall AROM Comments: lacking 50% knee flexion and hip flexion 2/2 strength deficits RLE PROM (degrees) RLE Overall PROM Comments: DF to neutral only  RLE Strength RLE Overall Strength Comments: knee extension 4-/5 all other hip/knee  motionsas well as ankle PF, 3/5. Ankle DF 0/5 LLE Assessment LLE Assessment: Exceptions to WAlleg[facility] Hospital Of GreenvilleLLE Strength LLE Overall Strength Comments: 4/5 proximal to distal   See Function Navigator for Current Functional Status.   Refer to Care Plan for Long Term Goals  Recommendations for other services: None   Discharge Criteria: Patient will be discharged from PT if patient refuses treatment 3 consecutive times without medical reason, if treatment goals not met, if there is a change in medical status, if patient makes no progress towards goals or if patient is discharged from hospital.  The above assessment, treatment plan, treatment alternatives and goals were discussed and mutually agreed upon: by patient  ALorie Phenix1/07/2017, 4:58 PM

## 2016-08-23 NOTE — Progress Notes (Signed)
Social Work Assessment and Plan Social Work Assessment and Plan  Patient Details  Name: Yvonne Parrish MRN: [id number] Date of Birth: [birth year]  Today's Date: 08/23/2016  Problem List:  Patient Active Problem List   Diagnosis Date Noted  . Gait disturbance, post-stroke 08/22/2016  . Left pontine stroke ([place]) 08/22/2016  . Slurred speech   . Chronic obstructive pulmonary disease ([place])   . Coronary artery disease involving coronary bypass graft of native heart without angina pectoris   . Leukocytosis   . Hyperglycemia   . Right hemiparesis ([place])   . COPD exacerbation ([place]) 08/20/2016  . Aphasia 08/20/2016  . Asthma without status asthmaticus 08/20/2016  . CHF (congestive heart failure) ([place]) 08/20/2016  . Hyperlipidemia, unspecified 08/20/2016  . Hypertension 08/20/2016  . Myelodysplastic syndrome ([place]) 08/20/2016  . Osteoporosis, post-menopausal 08/20/2016  . Stroke (cerebrum) ([place]) 08/20/2016  . Ischemic stroke ([place])   . Cardiomyopathy, dilated ([place]) 01/10/2016  . H/O cardiac catheterization 01/10/2016  . Chronic back pain 11/01/2014  . History of coronary artery bypass graft 11/01/2014  . Acquired hypothyroidism 07/05/2014   Past Medical History:  Past Medical History:  Diagnosis Date  . Asthma in child    question of interstitial lung disease  . CHF (congestive heart failure) ([place])   . Chronic back pain   . Chronic pain of right knee    for past 30 years  . Heart murmur   . History of right foot drop   . Hypertension   . Hypothyroidism   . Myelodysplastic syndrome [facility])    Past Surgical History:  Past Surgical History:  Procedure Laterality Date  . CORONARY ANGIOPLASTY    . CORONARY ARTERY BYPASS GRAFT  1999  . KNEE ARTHROSCOPY    . knee fracture     Right knee pinning in '90's   . LUMBAR SPINE SURGERY    . TONSILLECTOMY     Social History:  reports that she has never smoked. She has never used smokeless tobacco. She reports that she does not drink  alcohol or use drugs.  Family / Support Systems Marital Status: Widow/Widower Patient Roles: Parent ChildrenDayna Parrish [id number] Other Supports: Yvonne Parrish-daughter [id number]  [phone] Anticipated Caregiver: Both daughter's will participate in pt's care and hire assistance also Ability/Limitations of Caregiver: daughter's to work together and hire a caregiver since they can not provide 24 hr care Caregiver Availability: Other (Comment) (Work on a plan for 24 hr care) Family Dynamics: Daughter's have been assisting pt prior to admission with home managment and appointments. Pt has been able to live alone and was mod/i with mobility prior to this stroke. Her main goal is to get back home from here.  Social History Preferred language: English Religion: Non-Denominational Cultural Background: No issues Education: High School Read: Yes Write: Yes Employment Status: Retired Freight forwarder Issues: No issues Guardian/Conservator: None-according to MD pt is not capable at this time to make her own decisions, will look toward her daughter's since they are her next of kin.    Abuse/Neglect Physical Abuse: Denies Verbal Abuse: Denies Sexual Abuse: Denies Exploitation of patient/patient's resources: Denies Self-Neglect: Denies  Emotional Status Pt's affect, behavior adn adjustment status: Pt is motivated to do what she can for herself and to get back home. Her daughter's have noticed she is sundowning now after her stroke. Both aware she will need 24 hr assist at discharge for her safety. She has been using a rollator for yerars due to her knee and is hoping  she can do this again. Recent Psychosocial Issues: other health issues-managed with assist from her daughter's prior to admission. Pyschiatric History: No history deferred depression screen due to adjusting to CIR and tired from therapies. Will monitor and have neuro-psych see if needed. She is a  very independent person according to her daughter's and will do what is needed to achieve this goal. Substance Abuse History: No issues  Patient / Family Perceptions, Expectations & Goals Pt/Family understanding of illness & functional limitations: Pt and daughter's have spoken with the MD and feel they have a good understanding of her stroke and deficits. She has her other health issues-mainly her knee issues and hope this will not limit her too much while here. Daughter's plan to be here daily. Premorbid pt/family roles/activities: Mother, grandmother, retiree, church member, Health visitor, etc Anticipated changes in roles/activities/participation: resume Pt/family expectations/goals: Pt states: " I want to get back to doing what I was before this happened."  Daughter states: " We are hopeful she will do well and willing to assist her to be able to go home from here."  [employer]: None Premorbid Home Care/DME Agencies: Other (Comment) (has had OP therapies in the past) Transportation available at discharge: Daughter's do the driving Resource referrals recommended: Support group (specify)  Discharge Planning Living Arrangements: Alone Support Systems: Children, Friends/neighbors, Immunologist, Other relatives Type of Residence: Private residence Insurance Resources: Multimedia programmer (specify) Primary school teacher) Financial Resources: Radio broadcast assistant Screen Referred: No Living Expenses: Own Money Management: Patient Does the patient have any problems obtaining your medications?: No Home Management: Daughter's do the home management-pt did cook simple meals-re-heat Patient/Family Preliminary Plans: Return home with daughter's rotating and possible hired assist. Daughter and son in-law took care of his Mother for eight years with [employer] and are aware of private duty agencies. Daughter reports: " Mom is very private and doesn't want [place] to [employer] or  bath her." She would rather have someone she doesn't know do this for her. Social Work Anticipated Follow Up Needs: HH/OP, Support Group  Clinical Impression Pleasant female who was marginally managing at home prior to admission with her stroke. Made her daughter's aware team will recommend 24 hr supervision/care for her safety due to sundowning now at night. Daughter's plan to be here daily and attend therapies with pt, so they can see her progress and know what and amount of care she will require at discharge. One daughter has been a caregiver for her mother in-law and Has had hired assistance, so aware that it takes time to get a caregiver in place. Will work with all on a safe discharge plan for pt.  [name] 08/23/2016, 1:29 PM

## 2016-08-23 NOTE — Evaluation (Signed)
Occupational Therapy Assessment and Plan  Patient Details  Name: Yvonne Parrish MRN: [id number] Date of Birth: Nov 18, 1928  OT Diagnosis: cognitive deficits, hemiplegia affecting dominant side and muscle weakness (generalized) Rehab Potential: Rehab Potential (ACUTE ONLY): Good ELOS: 3  weeks   Today's Date: 08/23/2016 OT Individual Time: 8250-5397 OT Individual Time Calculation (min): 75 min      Problem List: Patient Active Problem List   Diagnosis Date Noted  . Gait disturbance, post-stroke 08/22/2016  . Left pontine stroke ([place]) 08/22/2016  . Slurred speech   . Chronic obstructive pulmonary disease ([place])   . Coronary artery disease involving coronary bypass graft of native heart without angina pectoris   . Leukocytosis   . Hyperglycemia   . Right hemiparesis ([place])   . COPD exacerbation ([place]) 08/20/2016  . Aphasia 08/20/2016  . Asthma without status asthmaticus 08/20/2016  . CHF (congestive heart failure) ([place]) 08/20/2016  . Hyperlipidemia, unspecified 08/20/2016  . Hypertension 08/20/2016  . Myelodysplastic syndrome ([place]) 08/20/2016  . Osteoporosis, post-menopausal 08/20/2016  . Stroke (cerebrum) ([place]) 08/20/2016  . Ischemic stroke ([place])   . Cardiomyopathy, dilated ([place]) 01/10/2016  . H/O cardiac catheterization 01/10/2016  . Chronic back pain 11/01/2014  . History of coronary artery bypass graft 11/01/2014  . Acquired hypothyroidism 07/05/2014    Past Medical History:  Past Medical History:  Diagnosis Date  . Asthma in child    question of interstitial lung disease  . CHF (congestive heart failure) ([place])   . Chronic back pain   . Chronic pain of right knee    for past 30 years  . Heart murmur   . History of right foot drop   . Hypertension   . Hypothyroidism   . Myelodysplastic syndrome [facility])    Past Surgical History:  Past Surgical History:  Procedure Laterality Date  . CORONARY ANGIOPLASTY    . CORONARY ARTERY BYPASS GRAFT  1999  . KNEE ARTHROSCOPY     . knee fracture     Right knee pinning in '90's   . LUMBAR SPINE SURGERY    . TONSILLECTOMY      Assessment & Plan Clinical Impression: Patient is a 83 y.o. year old female   Patient transferred to [place] on 08/22/2016 .    Patient currently requires max to total A with basic self-care skills and basic functional mobiltiy  secondary to muscle weakness, decreased cardiorespiratoy endurance, impaired timing and sequencing, unbalanced muscle activation and decreased coordination, chronic foot drop on the right, decreased attention, decreased awareness, decreased problem solving, decreased memory and delayed processing and decreased standing balance, decreased postural control, hemiplegia and decreased balance strategies.  Prior to hospitalization, patient could complete ADL with modified independent .  Patient will benefit from skilled intervention to decrease level of assist with basic self-care skills and increase independence with basic self-care skills prior to discharge home with care partner.  Anticipate patient will require intermittent supervision and minimal physical assistance and follow up home health and follow up outpatient.  OT - End of Session Activity Tolerance: Tolerates 30+ min activity with multiple rests Endurance Deficit: Yes OT Assessment Rehab Potential (ACUTE ONLY): Good OT Patient demonstrates impairments in the following area(s): Balance;Safety;Behavior;Sensory;Skin Integrity;Cognition;Edema;Endurance;Motor;Nutrition;Pain;Perception OT Basic ADL's Functional Problem(s): Eating;Grooming;Bathing;Dressing;Toileting OT Transfers Functional Problem(s): Toilet;Tub/Shower OT Additional Impairment(s): Fuctional Use of Upper Extremity OT Plan OT Intensity: Minimum of 1-2 x/day, 45 to 90 minutes OT Frequency: 5 out of 7 days OT Duration/Estimated Length of Stay: 3  weeks OT Treatment/Interventions: Balance/vestibular training;Discharge planning;Pain  management;Self Care/advanced  ADL retraining;Therapeutic Activities;UE/LE Coordination activities;Visual/perceptual remediation/compensation;Therapeutic Exercise;Skin care/wound managment;Patient/family education;Functional mobility training;Disease mangement/prevention;Cognitive remediation/compensation;Community reintegration;DME/adaptive equipment instruction;Neuromuscular re-education;Psychosocial support;UE/LE Strength taining/ROM;Wheelchair propulsion/positioning;Splinting/orthotics;Functional electrical stimulation OT Self Feeding Anticipated Outcome(s): mod I  OT Basic Self-Care Anticipated Outcome(s): supervision to min A  OT Toileting Anticipated Outcome(s): min A  OT Bathroom Transfers Anticipated Outcome(s): min A  OT Recommendation Recommendations for Other Services: Neuropsych consult Patient destination: Home Follow Up Recommendations: Outpatient OT;Home health OT Equipment Recommended: To be determined   Skilled Therapeutic Intervention 1:1 OT eval initiated with Ot goals, purpose and role discussed with pt and pt's daughter. Self care retraining at shower level with focus on squat and stand pivot transfers from bed<w/c<>toilet, w/c<>tub bench with max A, functional use of right had as gross assist with mod A, sit to stands, standing postural alignment, etc. Pt with no clothes during this eval (they will be brought up later today. Pt requires more than reasonable amt of time to come into standing position with max A. Pt with chronic right foot drop and now with decr sensation and proprioception  in foot as well impacting transfers and safe placement of LE.  Pt with some pocketing of pills from this morning with decr awareness when performing oral care. Set up fro breakfast with min cues for visual attention to right side of tray. Left eating breakfast with her daughter.    OT Evaluation Precautions/Restrictions  Precautions Precautions: Fall Precaution Comments: chronic right foot drop Restrictions Weight  Bearing Restrictions: No General Chart Reviewed: Yes Family/Caregiver Present: Yes (daughter) Pain- indicated stiffness in back and hips when got out of bed but no further complaints afterwards   Home Living/Prior [place] expects to be discharged to:: Private residence Living Arrangements: Alone Available Help at Discharge: Family, Available PRN/intermittently Type of Home: House Home Access: Stairs to enter Technical brewer of Steps: 2 Entrance Stairs-Rails: None Home Layout: One level Bathroom Shower/Tub: Multimedia programmer: Handicapped height Bathroom Accessibility:  (with regular RW)  Lives With: Alone ADL ADL ADL Comments: see functional ambulation  Vision/Perception  Vision- History Baseline Vision/History: Wears glasses Wears Glasses: At all times Patient Visual Report: No change from baseline Vision- Assessment Vision Assessment?: No apparent visual deficits Additional Comments: will continue to assess  Cognition Overall Cognitive Status: Impaired/Different from baseline Arousal/Alertness: Awake/alert Orientation Level: Person;Place;Situation Person: Oriented Place: Oriented (needed prompts to say [place]) Situation: Oriented Year: 2018 Month: January Day of Week: Incorrect (Thursday) Memory: Impaired Memory Impairment: Decreased recall of new information Immediate Memory Recall: Sock;Blue;Bed Memory Recall: Blue;Bed (didnt recall sock) Memory Recall Blue: With Cue Memory Recall Bed: Without Cue Attention: Sustained;Selective Sustained Attention: Appears intact Selective Attention: Impaired Selective Attention Impairment: Functional basic Awareness: Impaired Awareness Impairment: Emergent impairment;Intellectual impairment Problem Solving: Impaired Problem Solving Impairment: Functional basic Safety/Judgment: Appears intact Sensation Sensation Light Touch: Impaired Detail Light Touch Impaired  Details: Impaired RUE;Impaired RLE Stereognosis: Appears Intact Hot/Cold: Impaired Detail Hot/Cold Impaired Details: Impaired RUE Proprioception: Impaired Detail Proprioception Impaired Details: Impaired RUE;Impaired RLE Coordination Gross Motor Movements are Fluid and Coordinated: No Fine Motor Movements are Fluid and Coordinated: No Coordination and Movement Description: decr corrdination on the right Motor  Motor Motor: Hemiplegia;Abnormal postural alignment and control Mobility  Bed Mobility Bed Mobility: Supine to Sit Supine to Sit: 2: Max assist Supine to Sit Details: Manual facilitation for weight bearing;Manual facilitation for weight shifting;Manual facilitation for placement;Verbal cues for precautions/safety Transfers Transfers: Sit to Stand;Stand to Sit Sit to Stand: 2: Max assist  Stand to Sit: 2: Max assist  Trunk/Postural Assessment  Cervical Assessment Cervical Assessment:  (forward flexed) Thoracic Assessment Thoracic Assessment:  (slightly kyphotic) Lumbar Assessment Lumbar Assessment:  (posterior pelvic tilt) Postural Control Postural Control: Deficits on evaluation Trunk Control: good Righting Reactions: deleayed  Balance Balance Balance Assessed: Yes Static Sitting Balance Static Sitting - Balance Support: Feet supported Static Sitting - Level of Assistance: 5: Stand by assistance Dynamic Sitting Balance Dynamic Sitting - Balance Support: Feet unsupported;No upper extremity supported;During functional activity Dynamic Sitting - Level of Assistance: 4: Min assist Static Standing Balance Static Standing - Balance Support: During functional activity;No upper extremity supported Static Standing - Level of Assistance: 3: Mod assist Extremity/Trunk Assessment RUE Assessment RUE Assessment: Exceptions to [facility] RUE AROM (degrees) RUE Overall AROM Comments: ~100 with AAROM at shoulder - limited range  RUE Tone RUE Tone: Within Functional Limits LUE  Assessment LUE Assessment: Within Functional Limits   See Function Navigator for Current Functional Status.   Refer to Care Plan for Long Term Goals  Recommendations for other services: None    Discharge Criteria: Patient will be discharged from OT if patient refuses treatment 3 consecutive times without medical reason, if treatment goals not met, if there is a change in medical status, if patient makes no progress towards goals or if patient is discharged from hospital.  The above assessment, treatment plan, treatment alternatives and goals were discussed and mutually agreed upon: by patient and by family  Nicoletta Ba 08/23/2016, 9:33 AM

## 2016-08-23 NOTE — Progress Notes (Signed)
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 right handed female with history of diastolic congestive heart failure, COPD, hypertension, CAD with CABG maintain on aspirin. Per chart review and daughter, patient lives alone and was independent with assistive device prior to admission. Daughter states that someone will be available on discharge for assistance. One level home with one step to entry. Family is working to arrange 24-hour assistance. Presented 08/20/2016 with right-sided weakness and slurred speech. CT/MRI showed acute nonhemorrhagic left paracentral pontine infarct. Remote tiny left caudate head infarct. MRA of the head with atherosclerotic type changes. CT of the head with no significant stenosis or occlusion. Echocardiogram with ejection fraction of [id number] grade 1 diastolic dysfunction. Patient did not receive TPA. Carotid Doppler no significant stenosis. Neurology consulted presently on Aggrenox for CVA prophylaxis. Subcutaneous heparin for DVT prophylaxis. Presently on a dysphagia #3 honey thick liquid diet.   Review of Systems  Constitutional: Negative for chills and fever.  HENT: Negative for hearing loss and tinnitus.   Eyes: Negative for blurred vision and double vision.  Respiratory: Negative for cough and shortness of breath.   Cardiovascular: Negative for chest pain, palpitations and leg swelling.  Gastrointestinal: Positive for constipation. Negative for nausea and vomiting.  Genitourinary:  Negative for dysuria and hematuria.  Musculoskeletal: Positive for myalgias.  Skin: Negative for rash.  Neurological: Positive for speech change and weakness. Negative for seizures.  All other systems reviewed and are negative.      Past Medical History:  Diagnosis Date  . Blood dyscrasia   . CHF (congestive heart failure) ([place])   . COPD (chronic obstructive pulmonary disease) ([place])   . Heart murmur   . Hypertension   . Hypothyroidism         Past Surgical History:  Procedure Laterality Date  . CORONARY ANGIOPLASTY    . CORONARY ARTERY BYPASS GRAFT  1999  . KNEE ARTHROSCOPY          Family History  Problem Relation Age of Onset  . Stroke Mother   . Heart disease Father    Social History:  reports that she has never smoked. She has never used smokeless tobacco. She reports that she does not drink alcohol or use drugs. Allergies: No Known Allergies       Medications Prior to Admission  Medication Sig Dispense Refill  . albuterol (PROVENTIL HFA;VENTOLIN HFA) 108 (90 Base) MCG/ACT inhaler Inhale 1-2 puffs into the lungs every 4 (four) hours as needed for wheezing.    Marland Kitchen aspirin EC 81 MG tablet Take 81 mg by mouth daily.    Marland Kitchen azelastine (ASTELIN) 0.1 % nasal spray Place 2 sprays into both nostrils daily as needed for rhinitis or allergies.     . Calcium Carbonate-Vit D-Min (CALTRATE 600+D PLUS PO) Take 1 tablet by mouth 2 (two) times daily.    . Cholecalciferol (VITAMIN D-3) 1000 units CAPS Take 1,000 Units by mouth daily.    . cyclobenzaprine (FLEXERIL) 10 MG tablet Take 10 mg by mouth 2 (two) times daily. MORNING AND  BEDTIME    . docusate sodium (COLACE) 100 MG capsule Take 100 mg by mouth 2 (two) times daily as needed for mild constipation.    Marland Kitchen levothyroxine (SYNTHROID, LEVOTHROID) 88 MCG tablet Take 88 mcg by mouth daily before breakfast.    . lisinopril (PRINIVIL,ZESTRIL) 40 MG tablet Take 40 mg by mouth daily.    Marland Kitchen loratadine (CLARITIN)  10 MG tablet Take 10 mg by mouth daily.    . metoprolol succinate (TOPROL-XL) 50 MG 24 hr tablet Take 50 mg by mouth daily.    . naproxen sodium (ANAPROX) 220 MG tablet Take 1-2 tablets by mouth 2 (two) times daily as needed for pain.    . polyethylene glycol powder (GLYCOLAX/MIRALAX) powder Take 17 g by mouth daily.    . pseudoephedrine-guaifenesin (MUCINEX D) 60-600 MG 12 hr tablet Take 1 tablet by mouth at bedtime.    . ranitidine (ZANTAC) 150 MG tablet Take 150 mg by mouth 2 (two) times daily.    . simvastatin (ZOCOR) 40 MG tablet Take 40 mg by mouth at bedtime.      Home: Home Living Family/patient expects to be discharged to:: Private residence Living Arrangements: Alone Available Help at Discharge: Family, Available PRN/intermittently (daughter reports working on setting up 24/7 assist.) Type of Home: House Home Access: Stairs to enter Technical brewer of Steps: 1 Entrance Stairs-Rails: None Home Layout: One level Bathroom Shower/Tub: Multimedia programmer: Handicapped height [place]: Environmental consultant - 4 wheels, Shower seat, Hand held shower head, Grab bars - tub/shower  Functional History: Prior Function Level of Independence: Independent with assistive device(s) Comments: history of R LE sciatica with screws in R knee Functional Status:  Mobility: Bed Mobility Overal bed mobility: Needs Assistance Bed Mobility: Supine to Sit Supine to sit: HOB elevated, Min assist General bed mobility comments: for R LE off EOB, increased time and effort to scoot to EOB Transfers Overall transfer level: Needs assistance Equipment used: Rolling walker (2 wheeled) Transfers: Sit to/from Stand Sit to Stand: Mod assist, +2 physical assistance General transfer comment: lifting assist from EOB, increased time and assist for hand placement on walker Ambulation/Gait Ambulation/Gait assistance: Mod assist, +2 physical assistance Ambulation Distance (Feet): 2  Feet Assistive device: Rolling walker (2 wheeled) Gait Pattern/deviations: Step-to pattern, Decreased step length - right, Shuffle, Decreased dorsiflexion - right General Gait Details: assist to progress R LE, difficulty progressing L due to R knee buckling; after couple of steps unable to continue due to weakness so brought chair up behind pt    ADL:    Cognition: Cognition Overall Cognitive Status: Impaired/Different from baseline Orientation Level: Oriented X4 Cognition Arousal/Alertness: Awake/alert Behavior During Therapy: WFL for tasks assessed/performed Overall Cognitive Status: Impaired/Different from baseline Area of Impairment: Attention, Orientation, Awareness Orientation Level: Disoriented to, Time Current Attention Level: Sustained Awareness: Intellectual General Comments: some decreased R side awareness  Blood pressure (!) 128/46, pulse 76, temperature 98.1 F (36.7 C), temperature source Oral, resp. rate 15, height 5' 1.5" (1.562 m), weight 67.2 kg (148 lb 1.6 oz), SpO2 98 %. Physical Exam  Vitals reviewed. Constitutional: She is oriented to person, place, and time. She appears well-developed and well-nourished.  HENT:  Head: Normocephalic and atraumatic.  Eyes: Conjunctivae and EOM are normal.  Neck: Normal range of motion. Neck supple. No thyromegaly present.  Cardiovascular: Normal rate and regular rhythm.   Respiratory: Effort normal and breath sounds normal. No respiratory distress. She has no wheezes.  GI: Soft. Bowel sounds are normal. She exhibits no  distension.  Musculoskeletal: She exhibits no edema or tenderness.  Neurological: She is alert and oriented to person, place, and time.  Makes eye contact with examiner.  Speech is dysarthric but intelligible.  DTRs symmetric Sensation diminished to light touch RLE>RUE Motor: LUE: 5/5 proximal to distal LLE: 4+/5 proximally, 5/5 distally RUE: 2/5 proximal to distal RLE: 4-/5 proximal to distal  Skin:  Skin is warm and dry.  Psychiatric: She has a normal mood and affect. Her behavior is normal.    Lab Results Last 24 Hours       Results for orders placed or performed during the hospital encounter of 08/20/16 (from the past 24 hour(s))  Protime-INR     Status: None   Collection Time: 08/20/16  5:41 PM  Result Value Ref Range   Prothrombin Time 13.4 11.4 - 15.2 seconds   INR 1.01   APTT     Status: None   Collection Time: 08/20/16  5:41 PM  Result Value Ref Range   aPTT 30 24 - 36 seconds  Troponin I     Status: None   Collection Time: 08/20/16  5:41 PM  Result Value Ref Range   Troponin I <0.03 <0.03 ng/mL  Culture, blood (routine x 2)     Status: None (Preliminary result)   Collection Time: 08/20/16  5:41 PM  Result Value Ref Range   Specimen Description BLOOD RIGHT HAND    Special Requests IN PEDIATRIC BOTTLE 3CC    Culture NO GROWTH < 24 HOURS    Report Status PENDING   Culture, blood (routine x 2)     Status: None (Preliminary result)   Collection Time: 08/20/16  5:41 PM  Result Value Ref Range   Specimen Description BLOOD LEFT HAND    Special Requests IN PEDIATRIC BOTTLE 2CC    Culture NO GROWTH < 24 HOURS    Report Status PENDING   Lipid panel     Status: None   Collection Time: 08/21/16  6:28 AM  Result Value Ref Range   Cholesterol 168 0 - 200 mg/dL   Triglycerides 87 <150 mg/dL   HDL 57 >40 mg/dL   Total CHOL/HDL Ratio 2.9 RATIO   VLDL 17 0 - 40 mg/dL   LDL Cholesterol 94 0 - 99 mg/dL      Imaging Results (Last 48 hours)  Ct Angio Head W Or Wo Contrast  Result Date: 08/20/2016 CLINICAL DATA:  83 year old hypertensive female with right-sided facial droop and right leg weakness. Last seen normal at midnight. Subsequent encounter. EXAM: CT ANGIOGRAPHY HEAD AND NECK. CT PERFUSION BRAIN TECHNIQUE: Multiphase CT imaging of the brain was performed following IV bolus contrast injection. Subsequent parametric perfusion maps were  calculated using RAPID software. Multidetector CT imaging of the head and neck was performed using the standard protocol during bolus administration of intravenous contrast. Multiplanar CT image reconstructions and MIPs were obtained to evaluate the vascular anatomy. Carotid stenosis measurements (when applicable) are obtained utilizing NASCET criteria, using the distal internal carotid diameter as the denominator. CONTRAST:  100 cc Isovue 370. COMPARISON:  08/20/2016 head CT. FINDINGS: CT Brain Perfusion Findings: CBF (<30%) Volume: 66mL Perfusion (Tmax>6.0s) volume: 19mL Mismatch Volume: 3mL Infarction Location:Not identified CT HEAD FINDINGS Brain: No intracranial hemorrhage or CT evidence of large acute infarct. No intracranial mass or abnormal enhancement. No hydrocephalus. Vascular: As below. Skull: Negative. Sinuses: Minimal ethmoid sinus air cell mucosal thickening. Orbits: Post lens replacement without acute abnormality. Review of the MIP images confirms the above  findings CTA NECK FINDINGS Aortic arch: 4 vessel arch with left vertebral artery arising directly from the arch. Mild to slightly moderate narrowing origin of the left vertebral artery and left subclavian artery. Ascending thoracic aorta measures up to 3.9 cm. Post CABG. One of the jumped grafts is occluded. Mild dilation at the arch/proximal descending thoracic aortic junction with prominent complex plaque in small ulcerations. Right carotid system: Plaque and ectasia involving portions of the right common carotid artery and internal carotid artery. Less than 60% diameter narrowing. Left carotid system: Plaque and ectasia involving portions of the left common carotid artery and internal carotid artery. Less than 60% diameter narrowing. Vertebral arteries: Left vertebral artery arises directly from the aortic arch with mild slightly moderate narrowing proximal aspect. Moderate narrowing proximal right subclavian artery with complex plaque. Moderate  narrowing origin of the right vertebral artery. Mild narrowing vertebral arteries at the C1-2 level. Skeleton: Cervical spondylotic changes most notable C6-7 followed by the C3-4 level. Remote Schmorl's node deformity and 20% loss of height superior endplate T5. Left lobe of thyroid gland extends slightly inferiorly but without worrisome mass. Mild symmetric prominence lingual tonsillar tissue with pulled secretions vallecula. Right carotid artery impresses upon the posterior aspect of the pharynx. Upper chest: Scattered small calcified granulomas. Review of the MIP images confirms the above findings CTA HEAD FINDINGS Anterior circulation: Anterior circulation without medium or large size vessel significant stenosis or occlusion. Cavernous segment internal carotid artery calcification ectasia with mild narrowing and dilation without high-grade stenosis. Posterior circulation: Calcified vertebral arteries with mild slightly moderate narrowing at the level the foramen magnum. No significant stenosis of the basilar artery. Moderate narrowing proximal right posterior cerebral artery. Bilateral posterior cerebral artery distal branch vessel mild narrowing and irregularity. Venous sinuses: Patent. Anatomic variants: Fetal contribution to the right posterior cerebral artery. Delayed phase: As above. Review of the MIP images confirms the above findings IMPRESSION: CT Brain Perfusion CBF (<30%) Volume: 61mL Perfusion (Tmax>6.0s) volume: 31mL Mismatch Volume: 84mL CT HEAD No intracranial hemorrhage or CT evidence of large acute infarct. CTA NECK FINDINGS Ascending thoracic aorta measures up to 3.9 cm. Recommend annual imaging followup by CTA or MRA. This recommendation follows 2010 ACCF/AHA/AATS/ACR/ASA/SCA/SCAI/SIR/STS/SVM Guidelines for the Diagnosis and Management of Patients with Thoracic Aortic Disease. Circulation.2010; [id number] Post CABG. One of the jumped grafts is occluded. Mild dilation at the arch/proximal  descending thoracic aortic junction with prominent complex plaque with small ULCERATIONS. Plaque and ectasia involving portions both common carotid arteries and both internal carotid arteries. Less than 60% diameter narrowing. Left vertebral artery arises directly from the aortic arch with mild to slightly moderate narrowing proximal aspect. Moderate narrowing proximal right subclavian artery with complex plaque. Moderate narrowing origin of the right vertebral artery. Mild narrowing vertebral arteries at the C1-2 level. CTA HEAD Anterior circulation without medium or large size vessel significant stenosis or occlusion. Calcified vertebral arteries with mild to slightly moderate narrowing at the level the foramen magnum. No significant stenosis of the basilar artery. Moderate narrowing proximal right posterior cerebral artery. Bilateral posterior cerebral artery distal branch vessel mild narrowing and irregularity. Electronically Signed   By: Genia Del M.D.   On: 08/20/2016 12:34   Ct Head Wo Contrast  Result Date: 08/20/2016 CLINICAL DATA:  Right-sided weakness, slurred speech. EXAM: CT HEAD WITHOUT CONTRAST TECHNIQUE: Contiguous axial images were obtained from the base of the skull through the vertex without intravenous contrast. COMPARISON:  None. FINDINGS: Brain: Minimal chronic ischemic white matter disease is  noted. No mass effect or midline shift is noted. Ventricular size is within normal limits. There is no evidence of mass lesion, hemorrhage or acute infarction. Vascular: Atherosclerosis of carotid siphons is noted. Skull: Normal. Negative for fracture or focal lesion. Sinuses/Orbits: No acute finding. Other: None. IMPRESSION: Minimal chronic ischemic white matter disease. No acute intracranial abnormality seen. Electronically Signed   By: Marijo Conception, M.D.   On: 08/20/2016 10:55   Ct Angio Neck W And/or Wo Contrast  Result Date: 08/20/2016 CLINICAL DATA:  83 year old hypertensive female  with right-sided facial droop and right leg weakness. Last seen normal at midnight. Subsequent encounter. EXAM: CT ANGIOGRAPHY HEAD AND NECK. CT PERFUSION BRAIN TECHNIQUE: Multiphase CT imaging of the brain was performed following IV bolus contrast injection. Subsequent parametric perfusion maps were calculated using RAPID software. Multidetector CT imaging of the head and neck was performed using the standard protocol during bolus administration of intravenous contrast. Multiplanar CT image reconstructions and MIPs were obtained to evaluate the vascular anatomy. Carotid stenosis measurements (when applicable) are obtained utilizing NASCET criteria, using the distal internal carotid diameter as the denominator. CONTRAST:  100 cc Isovue 370. COMPARISON:  08/20/2016 head CT. FINDINGS: CT Brain Perfusion Findings: CBF (<30%) Volume: 43mL Perfusion (Tmax>6.0s) volume: 83mL Mismatch Volume: 27mL Infarction Location:Not identified CT HEAD FINDINGS Brain: No intracranial hemorrhage or CT evidence of large acute infarct. No intracranial mass or abnormal enhancement. No hydrocephalus. Vascular: As below. Skull: Negative. Sinuses: Minimal ethmoid sinus air cell mucosal thickening. Orbits: Post lens replacement without acute abnormality. Review of the MIP images confirms the above findings CTA NECK FINDINGS Aortic arch: 4 vessel arch with left vertebral artery arising directly from the arch. Mild to slightly moderate narrowing origin of the left vertebral artery and left subclavian artery. Ascending thoracic aorta measures up to 3.9 cm. Post CABG. One of the jumped grafts is occluded. Mild dilation at the arch/proximal descending thoracic aortic junction with prominent complex plaque in small ulcerations. Right carotid system: Plaque and ectasia involving portions of the right common carotid artery and internal carotid artery. Less than 60% diameter narrowing. Left carotid system: Plaque and ectasia involving portions of the left  common carotid artery and internal carotid artery. Less than 60% diameter narrowing. Vertebral arteries: Left vertebral artery arises directly from the aortic arch with mild slightly moderate narrowing proximal aspect. Moderate narrowing proximal right subclavian artery with complex plaque. Moderate narrowing origin of the right vertebral artery. Mild narrowing vertebral arteries at the C1-2 level. Skeleton: Cervical spondylotic changes most notable C6-7 followed by the C3-4 level. Remote Schmorl's node deformity and 20% loss of height superior endplate T5. Left lobe of thyroid gland extends slightly inferiorly but without worrisome mass. Mild symmetric prominence lingual tonsillar tissue with pulled secretions vallecula. Right carotid artery impresses upon the posterior aspect of the pharynx. Upper chest: Scattered small calcified granulomas. Review of the MIP images confirms the above findings CTA HEAD FINDINGS Anterior circulation: Anterior circulation without medium or large size vessel significant stenosis or occlusion. Cavernous segment internal carotid artery calcification ectasia with mild narrowing and dilation without high-grade stenosis. Posterior circulation: Calcified vertebral arteries with mild slightly moderate narrowing at the level the foramen magnum. No significant stenosis of the basilar artery. Moderate narrowing proximal right posterior cerebral artery. Bilateral posterior cerebral artery distal branch vessel mild narrowing and irregularity. Venous sinuses: Patent. Anatomic variants: Fetal contribution to the right posterior cerebral artery. Delayed phase: As above. Review of the MIP images confirms the above findings  IMPRESSION: CT Brain Perfusion CBF (<30%) Volume: 56mL Perfusion (Tmax>6.0s) volume: 50mL Mismatch Volume: 4mL CT HEAD No intracranial hemorrhage or CT evidence of large acute infarct. CTA NECK FINDINGS Ascending thoracic aorta measures up to 3.9 cm. Recommend annual imaging  followup by CTA or MRA. This recommendation follows 2010 ACCF/AHA/AATS/ACR/ASA/SCA/SCAI/SIR/STS/SVM Guidelines for the Diagnosis and Management of Patients with Thoracic Aortic Disease. Circulation.2010; [id number] Post CABG. One of the jumped grafts is occluded. Mild dilation at the arch/proximal descending thoracic aortic junction with prominent complex plaque with small ULCERATIONS. Plaque and ectasia involving portions both common carotid arteries and both internal carotid arteries. Less than 60% diameter narrowing. Left vertebral artery arises directly from the aortic arch with mild to slightly moderate narrowing proximal aspect. Moderate narrowing proximal right subclavian artery with complex plaque. Moderate narrowing origin of the right vertebral artery. Mild narrowing vertebral arteries at the C1-2 level. CTA HEAD Anterior circulation without medium or large size vessel significant stenosis or occlusion. Calcified vertebral arteries with mild to slightly moderate narrowing at the level the foramen magnum. No significant stenosis of the basilar artery. Moderate narrowing proximal right posterior cerebral artery. Bilateral posterior cerebral artery distal branch vessel mild narrowing and irregularity. Electronically Signed   By: Genia Del M.D.   On: 08/20/2016 12:34   Mr Brain Wo Contrast  Result Date: 08/20/2016 EXAM: MRI HEAD WITHOUT CONTRAST MRA HEAD WITHOUT CONTRAST TECHNIQUE: Multiplanar, multiecho pulse sequences of the brain and surrounding structures were obtained without intravenous contrast. Angiographic images of the head were obtained using MRA technique without contrast. COMPARISON:  CT 08/20/2016. FINDINGS: MRI HEAD FINDINGS Brain: Acute nonhemorrhagic left paracentral pontine infarct. Remote tiny left caudate head infarct. Remote tiny right cerebellar infarct. Mild chronic microvascular changes. Mild global atrophy without hydrocephalus. Prominent choroid cysts incidentally noted.  No intracranial mass lesion noted on this unenhanced exam. Vascular: As below. Skull and upper cervical spine: C3-4 protrusion with moderate spinal stenosis with mild to moderate cord flattening. Sinuses/Orbits: Post lens replacement without acute abnormality. Visualized sinuses are clear. Other: Negative MRA HEAD FINDINGS Mild to slightly moderate narrowing distal vertebral arteries bilaterally. Mild narrowing mid basilar artery. Only proximal aspect of the right posterior inferior cerebellar artery is well delineated with narrowed vessel beyond this region. Mild to moderate narrowing proximal left posterior inferior cerebellar artery. Nonvisualized anterior inferior cerebellar artery bilaterally. Marked narrowing proximal right posterior cerebral artery with moderate to marked narrowing distal right posterior cerebral artery branches. Moderate tandem stenosis mid aspect left posterior cerebral artery. Mild to moderate tandem stenosis right superior cerebellar artery. Moderate narrowing distal M1 segment left middle cerebral artery. Ectatic slightly irregular cavernous segment internal carotid artery bilaterally without significant stenosis. Fetal contribution to the right posterior cerebral artery. IMPRESSION: MRI HEAD Acute nonhemorrhagic left paracentral pontine infarct. Remote tiny left caudate head infarct. Remote tiny right cerebellar infarct. Mild chronic microvascular changes. C3-4 protrusion with moderate spinal stenosis with mild to moderate cord flattening. MRA HEAD Intracranial atherosclerotic changes more notable involving posterior circulation as detailed above. These results were called by telephone at the time of interpretation on 08/20/2016 at 4:24 pm to Dr. Sharene Butters , who verbally acknowledged these results. Electronically Signed   By: Genia Del M.D.   On: 08/20/2016 17:08   Ct Cerebral Perfusion W Contrast  Result Date: 08/20/2016 CLINICAL DATA:  83 year old hypertensive female with  right-sided facial droop and right leg weakness. Last seen normal at midnight. Subsequent encounter. EXAM: CT ANGIOGRAPHY HEAD AND NECK. CT PERFUSION BRAIN TECHNIQUE:  Multiphase CT imaging of the brain was performed following IV bolus contrast injection. Subsequent parametric perfusion maps were calculated using RAPID software. Multidetector CT imaging of the head and neck was performed using the standard protocol during bolus administration of intravenous contrast. Multiplanar CT image reconstructions and MIPs were obtained to evaluate the vascular anatomy. Carotid stenosis measurements (when applicable) are obtained utilizing NASCET criteria, using the distal internal carotid diameter as the denominator. CONTRAST:  100 cc Isovue 370. COMPARISON:  08/20/2016 head CT. FINDINGS: CT Brain Perfusion Findings: CBF (<30%) Volume: 46mL Perfusion (Tmax>6.0s) volume: 25mL Mismatch Volume: 28mL Infarction Location:Not identified CT HEAD FINDINGS Brain: No intracranial hemorrhage or CT evidence of large acute infarct. No intracranial mass or abnormal enhancement. No hydrocephalus. Vascular: As below. Skull: Negative. Sinuses: Minimal ethmoid sinus air cell mucosal thickening. Orbits: Post lens replacement without acute abnormality. Review of the MIP images confirms the above findings CTA NECK FINDINGS Aortic arch: 4 vessel arch with left vertebral artery arising directly from the arch. Mild to slightly moderate narrowing origin of the left vertebral artery and left subclavian artery. Ascending thoracic aorta measures up to 3.9 cm. Post CABG. One of the jumped grafts is occluded. Mild dilation at the arch/proximal descending thoracic aortic junction with prominent complex plaque in small ulcerations. Right carotid system: Plaque and ectasia involving portions of the right common carotid artery and internal carotid artery. Less than 60% diameter narrowing. Left carotid system: Plaque and ectasia involving portions of the left  common carotid artery and internal carotid artery. Less than 60% diameter narrowing. Vertebral arteries: Left vertebral artery arises directly from the aortic arch with mild slightly moderate narrowing proximal aspect. Moderate narrowing proximal right subclavian artery with complex plaque. Moderate narrowing origin of the right vertebral artery. Mild narrowing vertebral arteries at the C1-2 level. Skeleton: Cervical spondylotic changes most notable C6-7 followed by the C3-4 level. Remote Schmorl's node deformity and 20% loss of height superior endplate T5. Left lobe of thyroid gland extends slightly inferiorly but without worrisome mass. Mild symmetric prominence lingual tonsillar tissue with pulled secretions vallecula. Right carotid artery impresses upon the posterior aspect of the pharynx. Upper chest: Scattered small calcified granulomas. Review of the MIP images confirms the above findings CTA HEAD FINDINGS Anterior circulation: Anterior circulation without medium or large size vessel significant stenosis or occlusion. Cavernous segment internal carotid artery calcification ectasia with mild narrowing and dilation without high-grade stenosis. Posterior circulation: Calcified vertebral arteries with mild slightly moderate narrowing at the level the foramen magnum. No significant stenosis of the basilar artery. Moderate narrowing proximal right posterior cerebral artery. Bilateral posterior cerebral artery distal branch vessel mild narrowing and irregularity. Venous sinuses: Patent. Anatomic variants: Fetal contribution to the right posterior cerebral artery. Delayed phase: As above. Review of the MIP images confirms the above findings IMPRESSION: CT Brain Perfusion CBF (<30%) Volume: 53mL Perfusion (Tmax>6.0s) volume: 57mL Mismatch Volume: 76mL CT HEAD No intracranial hemorrhage or CT evidence of large acute infarct. CTA NECK FINDINGS Ascending thoracic aorta measures up to 3.9 cm. Recommend annual imaging  followup by CTA or MRA. This recommendation follows 2010 ACCF/AHA/AATS/ACR/ASA/SCA/SCAI/SIR/STS/SVM Guidelines for the Diagnosis and Management of Patients with Thoracic Aortic Disease. Circulation.2010; [id number] Post CABG. One of the jumped grafts is occluded. Mild dilation at the arch/proximal descending thoracic aortic junction with prominent complex plaque with small ULCERATIONS. Plaque and ectasia involving portions both common carotid arteries and both internal carotid arteries. Less than 60% diameter narrowing. Left vertebral artery arises directly from the aortic  arch with mild to slightly moderate narrowing proximal aspect. Moderate narrowing proximal right subclavian artery with complex plaque. Moderate narrowing origin of the right vertebral artery. Mild narrowing vertebral arteries at the C1-2 level. CTA HEAD Anterior circulation without medium or large size vessel significant stenosis or occlusion. Calcified vertebral arteries with mild to slightly moderate narrowing at the level the foramen magnum. No significant stenosis of the basilar artery. Moderate narrowing proximal right posterior cerebral artery. Bilateral posterior cerebral artery distal branch vessel mild narrowing and irregularity. Electronically Signed   By: Genia Del M.D.   On: 08/20/2016 12:34   Dg Chest Portable 1 View  Result Date: 08/20/2016 CLINICAL DATA:  Stroke-like symptoms EXAM: PORTABLE CHEST 1 VIEW COMPARISON:  None. FINDINGS: Cardiac shadow is mildly enlarged. Postsurgical changes are noted. The lungs are well aerated bilaterally without focal infiltrate or sizable effusion. No acute bony abnormality is noted. IMPRESSION: No acute abnormality seen. Electronically Signed   By: Inez Catalina M.D.   On: 08/20/2016 12:08   Mr Jodene Nam Head/brain [id number] Cm  Result Date: 08/20/2016 EXAM: MRI HEAD WITHOUT CONTRAST MRA HEAD WITHOUT CONTRAST TECHNIQUE: Multiplanar, multiecho pulse sequences of the brain and surrounding  structures were obtained without intravenous contrast. Angiographic images of the head were obtained using MRA technique without contrast. COMPARISON:  CT 08/20/2016. FINDINGS: MRI HEAD FINDINGS Brain: Acute nonhemorrhagic left paracentral pontine infarct. Remote tiny left caudate head infarct. Remote tiny right cerebellar infarct. Mild chronic microvascular changes. Mild global atrophy without hydrocephalus. Prominent choroid cysts incidentally noted. No intracranial mass lesion noted on this unenhanced exam. Vascular: As below. Skull and upper cervical spine: C3-4 protrusion with moderate spinal stenosis with mild to moderate cord flattening. Sinuses/Orbits: Post lens replacement without acute abnormality. Visualized sinuses are clear. Other: Negative MRA HEAD FINDINGS Mild to slightly moderate narrowing distal vertebral arteries bilaterally. Mild narrowing mid basilar artery. Only proximal aspect of the right posterior inferior cerebellar artery is well delineated with narrowed vessel beyond this region. Mild to moderate narrowing proximal left posterior inferior cerebellar artery. Nonvisualized anterior inferior cerebellar artery bilaterally. Marked narrowing proximal right posterior cerebral artery with moderate to marked narrowing distal right posterior cerebral artery branches. Moderate tandem stenosis mid aspect left posterior cerebral artery. Mild to moderate tandem stenosis right superior cerebellar artery. Moderate narrowing distal M1 segment left middle cerebral artery. Ectatic slightly irregular cavernous segment internal carotid artery bilaterally without significant stenosis. Fetal contribution to the right posterior cerebral artery. IMPRESSION: MRI HEAD Acute nonhemorrhagic left paracentral pontine infarct. Remote tiny left caudate head infarct. Remote tiny right cerebellar infarct. Mild chronic microvascular changes. C3-4 protrusion with moderate spinal stenosis with mild to moderate cord  flattening. MRA HEAD Intracranial atherosclerotic changes more notable involving posterior circulation as detailed above. These results were called by telephone at the time of interpretation on 08/20/2016 at 4:24 pm to Dr. Sharene Butters , who verbally acknowledged these results. Electronically Signed   By: Genia Del M.D.   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  Records reviewed and summated above. Stroke: Continue secondary stroke prophylaxis and Risk Factor Modification listed below:   Antiplatelet therapy:   Blood Pressure Management:  Continue current medication with prn's with permisive HTN per primary team Statin Agent:   ?Diabetes management:  HbA1c pending Right sided hemiparesis: fit for orthosis to prevent contractures (resting hand splint for day, wrist cock up splint at night, PRAFO, etc) Motor recovery:  Fluoxetine  1. Does the need for close, 24 hr/day medical supervision in concert with the patient's rehab needs make it unreasonable for this patient to be served in a less intensive setting? Yes  2. Co-Morbidities requiring supervision/potential complications: diastolic congestive heart failure (monitor for signs/symptoms of fluid overload), COPD (monitor RR and O2 sats with increased physical activity), HTN (monitor and provide prns in accordance with increased physical exertion and pain), CAD with CABG (cont meds), post-stroke dysphagia (advance diet as tolerated), hypothyroidism (ensure mood and affect do not limit therapies), leukocytosis (cont to monitor for signs and symptoms of infection, further workup if indicated), hyperglycemia (HbA1c pending, monitor in accordance with exercise and adjust meds as necessary) 3. Due to safety, disease management and patient education, does the patient require 24 hr/day rehab nursing? Yes 4. Does the patient require coordinated care of  a physician, rehab nurse, PT (1-2 hrs/day, 5 days/week), OT (1-2 hrs/day, 5 days/week) and SLP (1-2 hrs/day, 5 days/week) to address physical and functional deficits in the context of the above medical diagnosis(es)? Yes Addressing deficits in the following areas: balance, endurance, locomotion, strength, transferring, bathing, dressing, toileting, speech, swallowing and psychosocial support 5. Can the patient actively participate in an intensive therapy program of at least 3 hrs of therapy per day at least 5 days per week? Yes 6. The potential for patient to make measurable gains while on inpatient rehab is excellent 7. Anticipated functional outcomes upon discharge from inpatient rehab are supervision  with PT, supervision and min assist with OT, independent and modified independent with SLP. 8. Estimated rehab length of stay to reach the above functional goals is: 13-18 days. 9. Does the patient have adequate social supports and living environment to accommodate these discharge functional goals? Yes 10. Anticipated D/C setting: Home 11. Anticipated post D/C treatments: HH therapy and Home excercise program 12. Overall Rehab/Functional Prognosis: good  RECOMMENDATIONS: This patient's condition is appropriate for continued rehabilitative care in the following setting: CIR Patient has agreed to participate in recommended program. Yes Note that insurance prior authorization may be required for reimbursement for recommended care.  Comment: Rehab Admissions Coordinator to follow up.  Cathlyn Parsons., PA-C 08/21/2016  Delice Lesch, MD, FAAPMR    Revision History                   Routing History

## 2016-08-23 NOTE — Plan of Care (Signed)
Problem: RH PAIN MANAGEMENT Goal: RH STG PAIN MANAGED AT OR BELOW PT'S PAIN GOAL Less than 3.     

## 2016-08-23 NOTE — Progress Notes (Signed)
Yvonne Parrish Rehab Admission Coordinator Signed Physical Medicine and Rehabilitation  PMR Pre-admission Date of Service: 08/22/2016 3:40 PM  Related encounter: ED to Hosp-Admission (Discharged) from 08/20/2016 in [place]       [] Hide copied text PMR Admission Coordinator Pre-Admission Assessment  Patient: Yvonne Parrish is an 83 y.o., female MRN: [id number] DOB: April 29, 1929 Height: 5' 1.5" (156.2 cm) Weight: 67.2 kg (148 lb 1.6 oz)                                                                                                                                                  Insurance Information HMO:     PPO: X     PCP:      IPA:      80/20:      OTHER:  PRIMARY: U[employer] Medicare       Policy#: [id number]      Subscriber: Self CM Name: Sherlynn Stalls       Phone#: [phone]     Fax#: [phone] Pre-Cert#: [id number]      Employer:  Benefits:  Phone #: [phone]     Name: verified online  Eff. 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 RH female with history of HTN, interstitial lung disease, CAD s/p CABG, chronic systolic CHF, chronic back pain/spasma, TIA last summer, who was admitted on 08/21/15 with right sided weakness and difficulty speaking. CT perfusion scan  negative. CTA head/neck revealed 3.9 cm ascending thoracic aorta, moderate narrowing of right SA (complex plaque) and R-VA and moderate narrowing of proximal R-PCA. MRI brain done revealing small left paracentral pontine, C3-C4 protrusion with moderate stenosis and cord flattening as well as intracranial atherosclerotic changes involving posterior circulation. Carotid dopplers without significant ICA stenosis. Cardiac echo with EF 55-60%, severely calcified mitral and aortic valve, moderate AVR and moderate pulmonary HTN. Dr. Leonie Man felt that infarct secondary to small  vessel disease. Patient declined Plavix and was started on Aggrenox for secondary stroke prevention. Patient with resultant right sided weakness with sensory deficits and right knee instability, posterior lean, dysphagia, and expressive deficits. MBS done 08/22/16 recommending regular textures and thin liquids. CIR recommended for follow up therapy and admitted 08/22/16.   NIH Total: 9    Past Medical History      Past Medical History:  Diagnosis Date  . Blood dyscrasia   . CHF (congestive heart failure) ([place])   . COPD (chronic obstructive pulmonary disease) ([place])   . Heart murmur   . Hypertension   . Hypothyroidism     Family History  family history includes Heart disease in her father; Stroke in her mother.  Prior Rehab/Hospitalizations:  Has the patient had major surgery during 100 days prior to admission? No  Current Medications   Current Facility-Administered Medications:  .  0.9 %  sodium chloride infusion, , Intravenous, Continuous, Rondel Jumbo, PA-C, Last Rate: 50 mL/hr at 08/20/16 1848 .  acetaminophen (TYLENOL) tablet 650 mg, 650 mg, Oral, Q4H PRN, 650 mg at 08/22/16 0040 **OR** acetaminophen (TYLENOL) solution 650 mg, 650 mg, Per Tube, Q4H PRN **OR** acetaminophen (TYLENOL) suppository 650 mg, 650 mg, Rectal, Q4H PRN, Rondel Jumbo, PA-C .  albuterol (PROVENTIL HFA;VENTOLIN HFA) 108 (90 Base)  MCG/ACT inhaler 1-2 puff, 1-2 puff, Inhalation, Q4H PRN, Rondel Jumbo, PA-C .  aspirin chewable tablet 81 mg, 81 mg, Oral, Daily, Donzetta Starch, NP, 81 mg at 08/22/16 0929 .  dipyridamole-aspirin (AGGRENOX) 200-25 MG per 12 hr capsule 1 capsule, 1 capsule, Oral, QHS, 1 capsule at 08/21/16 2150 **FOLLOWED BY** [START ON 09/04/2016] dipyridamole-aspirin (AGGRENOX) 200-25 MG per 12 hr capsule 1 capsule, 1 capsule, Oral, BID, Donzetta Starch, NP .  famotidine (PEPCID) tablet 20 mg, 20 mg, Oral, BID, Rondel Jumbo, PA-C, 20 mg at 08/22/16 [id number] .  heparin injection 5,000 Units, 5,000 Units, Subcutaneous, Q8H, Rondel Jumbo, PA-C, 5,000 Units at 08/22/16 1431 .  hydrALAZINE (APRESOLINE) injection 5-10 mg, 5-10 mg, Intravenous, Q8H PRN, Rondel Jumbo, PA-C .  levothyroxine (SYNTHROID, LEVOTHROID) tablet 88 mcg, 88 mcg, Oral, QAC breakfast, Rondel Jumbo, PA-C, 88 mcg at 08/22/16 0800 .  lisinopril (PRINIVIL,ZESTRIL) tablet 40 mg, 40 mg, Oral, Daily, Rondel Jumbo, PA-C, 40 mg at 08/22/16 0929 .  metoprolol succinate (TOPROL-XL) 24 hr tablet 50 mg, 50 mg, Oral, Daily, Rondel Jumbo, PA-C, 50 mg at 08/22/16 0929 .  senna-docusate (Senokot-S) tablet 1 tablet, 1 tablet, Oral, QHS PRN, Rondel Jumbo, PA-C .  simvastatin (ZOCOR) tablet 40 mg, 40 mg, Oral, q1800, Rondel Jumbo, PA-C, 40 mg at 08/21/16 1804  Patients Current Diet: Diet regular Room service appropriate? Yes; Fluid consistency: Thin  Precautions / Restrictions Precautions Precautions: Fall Restrictions Weight Bearing Restrictions: No   Has the patient had 2 or more falls or a fall with injury in the past year?No  Prior Activity Level Household: Prior to admission patient lived alone at home and daughter assisted with grocery shopping and outside of the house tasks.  Patient went out 1-2 times a month for medical appointments, but would walk to the mailbox daily to check mail.    Home Assistive Devices / Equipment Home Equipment:  Walker - 4 wheels, Shower seat, Hand held shower head, Grab bars - tub/shower  Prior Device Use: Indicate devices/aids used by the patient prior to current illness, exacerbation or injury? Walker  Prior Functional Level Prior Function  Level of Independence: Independent with assistive device(s) Comments: history of R LE sciatica with screws in R knee  Self Care: Did the patient need help bathing, dressing, using the toilet or eating? Independent  Indoor Mobility: Did the patient need assistance with walking from room to room (with or without device)? Independent  Stairs: Did the patient need assistance with internal or external stairs (with or without device)? Independent  Functional Cognition: Did the patient need help planning regular tasks such as shopping or remembering to take medications? Independent  Current Functional Level Cognition Overall Cognitive Status: Impaired/Different from baseline Current Attention Level: Sustained Orientation Level: Oriented to person, Oriented to place General Comments: Pt reporting the year as 2020. some decreased R side awareness    Extremity Assessment (includes Sensation/Coordination) Upper Extremity Assessment: RUE deficits/detail, LUE deficits/detail RUE Deficits / Details: Pt demonstrates decreased strength and AROM of RUE grossly. Brunstrom level IV R hand and arm. Pt demonstrates some finger isolation and nearly complete composite grasp. Some extensor lag noted with digit extension. Pt motivated to use R hand for functional tasks as she is R handed. Able to initiate hand to mouth movements but unable to complete full ROM. RUE Sensation: decreased light touch, decreased proprioception RUE Coordination: decreased fine motor, decreased gross motor LUE Deficits / Details: Generalized weakness  Lower Extremity Assessment: Defer to PT evaluation RLE Deficits / Details: history of foot drop from sciatica previously; notes had some numbness,  but now more, strength hip flexion  RLE Sensation: decreased light touch   ADLs Overall ADL's : Needs assistance/impaired Eating/Feeding: Supervision/ safety, Sitting Grooming: Sitting, Moderate assistance (for bilateral tasks) Upper Body Bathing: Moderate assistance, Sitting Lower Body Bathing: Maximal assistance, Sit to/from stand Upper Body Dressing : Moderate assistance, Sitting Lower Body Dressing: Maximal assistance, Sit to/from stand Toilet Transfer: Moderate assistance, +2 for physical assistance, +2 for safety/equipment, RW Toileting- Clothing Manipulation and Hygiene: Maximal assistance, Sitting/lateral lean Functional mobility during ADLs: Moderate assistance, +2 for physical assistance, +2 for safety/equipment, Rolling walker General ADL Comments: Pt and daughter educated on techniques to improve safety with self-feeding tasks.   Mobility Overal bed mobility: Needs Assistance Bed Mobility: Supine to Sit Supine to sit: HOB elevated, Min assist General bed mobility comments: for R LE off EOB, increased time and effort to scoot to EOB   Transfers Overall transfer level: Needs assistance Equipment used: Rolling walker (2 wheeled) Transfers: Sit to/from Stand Sit to Stand: Mod assist, +2 physical assistance General transfer comment: lifting assist from EOB, increased time and assist for hand placement on walker   Ambulation / Gait / Stairs / Wheelchair Mobility Ambulation/Gait Ambulation/Gait assistance: Mod assist, +2 physical assistance Ambulation Distance (Feet): 2 Feet Assistive device: Rolling walker (2 wheeled) Gait Pattern/deviations: Step-to pattern, Decreased step length - right, Shuffle, Decreased dorsiflexion - right General Gait Details: assist to progress R LE, difficulty progressing L due to R knee buckling; after couple of steps unable to continue due to weakness so brought chair up behind pt   Posture / Balance Balance Overall balance assessment: Needs  assistance Sitting-balance support: No upper extremity supported, Feet supported Sitting balance-Leahy Scale: Fair Postural control: Posterior lean Standing balance support: Bilateral upper extremity supported Standing balance-Leahy Scale: Poor Standing balance comment: UE support and assist for balance   Special needs/care consideration BiPAP/CPAP: No CPM: No Continuous Drip IV: No Dialysis: No         Life Vest: No Oxygen: No Special Bed: No Trach Size: No Wound Vac (area): No  Skin: WDL                                Bowel mgmt: PTA  Bladder mgmt: Continent  Diabetic mgmt: No    Previous Home Environment Living Arrangements: Alone Available Help at Discharge: Family, Available PRN/intermittently (daughter reports working on setting up 24/7 assist.) Type of Home: House Home Layout: One level Home Access: Stairs to enter Entrance Stairs-Rails: None Technical brewer of Steps: 1 Bathroom Shower/Tub: Multimedia programmer: Handicapped height Home Care Services: No  Discharge Living Setting Plans for Discharge Living Setting: Patient's home, Alone Type of Home at Discharge: House Discharge Home Layout: One level Discharge Home Access: Stairs to enter Entrance Stairs-Rails: Right Entrance Stairs-Number of Steps: 2 Discharge Bathroom Shower/Tub: Walk-in shower (ledge and shower chair ) Discharge Bathroom Toilet: Handicapped height Discharge Bathroom Accessibility: No Does the patient have any problems obtaining your medications?: No  Social/Family/Support Systems Patient Roles: Parent (grandparent ) Sport and exercise psychologist Information: Daughters Anticipated Caregiver: America Brown [phone] Anticipated Caregiver's Contact Information: Elie Confer [phone] Ability/Limitations of Caregiver: Daughters will take turns and hire for 24/7  Caregiver Availability: 24/7 Discharge Plan Discussed with Primary Caregiver: Yes Is Caregiver In Agreement with Plan?:  Yes Does Caregiver/Family have Issues with Lodging/Transportation while Pt is in Rehab?: No  Goals/Additional Needs Patient/Family Goal for Rehab: OT: Supervision-Min A; PT: Supervision; SLP: Mod I  Expected length of stay: 13-18 days  Cultural Considerations: None Dietary Needs: Upgraded to regular and thin 08/22/16 Equipment Needs: TBD Special Service Needs: None Additional Information: Patient has a stuffed cat "Sonia Baller" that "breathes" when you pet it.  It comforts her Pt/Family Agrees to Admission and willing to participate: Yes Program Orientation Provided & Reviewed with Pt/Caregiver Including Roles  & Responsibilities: Yes Additional Information Needs: If daughter decide to hire home help for their mom please provide resources  Information Needs to be Provided By: CSW   Decrease burden of Care through IP rehab admission: No  Possible need for SNF placement upon discharge: No  Patient Condition: This patient's condition remains as documented in the consult dated 08/21/16, in which the Rehabilitation Physician determined and documented that the patient's condition is appropriate for intensive rehabilitative care in an inpatient rehabilitation facility. Will admit to inpatient rehab today.   Preadmission Screen Completed By:  Yvonne Parrish, 08/22/2016 3:54 PM ______________________________________________________________________   Discussed status with Dr. Letta Pate on 08/22/16 at 39 and received telephone approval for admission today.  Admission Coordinator:  Yvonne Parrish, time 1600/Date 08/22/16       Cosigned by: Charlett Blake, MD at 08/22/2016 4:01 PM  Revision History

## 2016-08-23 NOTE — Care Management Note (Signed)
[facility] Individual Statement of Services  Patient Name:  Yvonne Parrish  Date:  08/23/2016  Welcome to the [facility].  Our goal is to provide you with an individualized program based on your diagnosis and situation, designed to meet your specific needs.  With this comprehensive rehabilitation program, you will be expected to participate in at least 3 hours of rehabilitation therapies Monday-Friday, with modified therapy programming on the weekends.  Your rehabilitation program will include the following services:  Physical Therapy (PT), Occupational Therapy (OT), Speech Therapy (ST), 24 hour per day rehabilitation nursing, Therapeutic Recreaction (TR), Case Management (Social Worker), Rehabilitation Medicine, Nutrition Services and Pharmacy Services  Weekly team conferences will be held on Wednesday to discuss your progress.  Your Social Worker will talk with you frequently to get your input and to update you on team discussions.  Team conferences with you and your family in attendance may also be held.  Expected length of stay: 21 days Overall anticipated outcome: overall min assist level  Depending on your progress and recovery, your program may change. Your Social Worker will coordinate services and will keep you informed of any changes. Your Social Worker's name and contact numbers are listed  below.  The following services may also be recommended but are not provided by the [facility]:   [place] will be made to provide these services after discharge if needed.  Arrangements include referral to agencies that provide these services.  Your insurance has been verified to be:  [employer]-Medicare Your primary doctor is:  [facility]  Pertinent information will be shared with your doctor and your insurance company.  Social Worker:  Ovidio Kin, [place] or [phone]  Information discussed with and copy given to patient by: [name], 08/23/2016, 2:06 PM

## 2016-08-24 ENCOUNTER — Inpatient Hospital Stay (HOSPITAL_COMMUNITY): Payer: Medicare Other | Admitting: *Deleted

## 2016-08-24 ENCOUNTER — Inpatient Hospital Stay (HOSPITAL_COMMUNITY): Payer: Medicare Other | Admitting: Occupational Therapy

## 2016-08-24 NOTE — Progress Notes (Signed)
Physical Therapy Session Note  Patient Details  Name: Yvonne Parrish MRN: [id number] Date of Birth: 1929-02-26  Today's Date: 08/24/2016 PT Individual Time: [id number] PT Individual Time Calculation (min): 61 min    Short Term Goals:Week 1:  PT Short Term Goal 1 (Week 1): Patient will perform bed mobility consistently with mod assis t PT Short Term Goal 2 (Week 1): Patient will transfer with mod assist via Squat pivot.  PT Short Term Goal 3 (Week 1): Patient will perform WC moblitity x 156ft with min assist PT Short Term Goal 4 (Week 1): Patient will initate stair training  PT Short Term Goal 5 (Week 1): Patient will ambulate 79ft with mod assist from PT and LRAD  Skilled Therapeutic Interventions/Progress Updates:  Tx focused on functional mobility training, gait in // bars, and NMR via forced use, manual facilitation, and multi-modal cues and mirror for visual feedback. Pt educated on principles of stroke recovery, especially forced use and encouraged her to look to her R during all transitional movements. Daughter present.   Pt needing to toilet upon arrival. She needed up to Max A for toilet transfer with grab bar and step-by-pteop cues for technique and sequence.   WC propulsion in controlled setting x75' with Mod A and cues for efficiency and steering.   Kinetron x5 min from [facility] posotion at 80cm/sec with decent excursion bil.   NMR in // Bars for sit<>stands, lateral weight shifts, mini-squats, and static postural control, using mirror for feeback. Pt needed seated rest following each 2 min in standing. Pt able to perform LAQ on RLE in sitting x10 and ext end knee in standing with tactile cues.   Gait in // bars x8' with Max A for weight shifting and advancing RLE in swing.  Pt left up in [facility] with all needs in reach.       Therapy Documentation Precautions:  Precautions Precautions: Fall Precaution Comments: chronic right foot drop Restrictions Weight Bearing Restrictions:  No General:   Vital Signs: Therapy Vitals Temp: 97.9 F (36.6 C) Temp Source: Oral Pulse Rate: 70 Resp: 16 BP: (!) 134/57 Patient Position (if appropriate): Lying Oxygen Therapy SpO2: 94 % O2 Device: Not Delivered Pain: none   See Function Navigator for Current Functional Status.   Therapy/Group: Individual Therapy  Numair Masden Soundra Pilon, PT, DPT  08/24/2016, 10:32 AM

## 2016-08-24 NOTE — Progress Notes (Signed)
Orthopedic Tech Progress Note Patient Details:  TOMMIE PETROV Aug 01, 1929 [id number]  Patient ID: [name], female   DOB: 11-28-1928, 83 y.o.   MRN: [id number]   [place] Pink 08/24/2016, 8:37 AMCalled [facility] for right prafo boot, resting hand splint and wrist cockup splint.

## 2016-08-24 NOTE — Progress Notes (Signed)
[facility] PHYSICAL MEDICINE & REHABILITATION     PROGRESS NOTE  Subjective/Complaints:  Pt seen laying in bed this AM.  She states she slept well overnight, confirmed by family.  She also had a good first day in therapies yesterday.    ROS: Denies CP, SOB, N/V/D.  Objective: Vital Signs: Blood pressure (!) 134/57, pulse 70, temperature 97.9 F (36.6 C), temperature source Oral, resp. rate 16, height 5\' 4"  (1.626 m), weight 68.5 kg (151 lb 0.2 oz), SpO2 94 %. Dg Swallowing Func-speech Pathology  Result Date: 08/22/2016 Objective Swallowing Evaluation: Type of Study: MBS-Modified Barium Swallow Study Patient Details Name: Yvonne Parrish MRN: [id number] Date of Birth: Nov 21, 1928 Today's Date: 08/22/2016 Time: SLP Start Time (ACUTE ONLY): 1300-SLP Stop Time (ACUTE ONLY): 1315 SLP Time Calculation (min) (ACUTE ONLY): 15 min Past Medical History: Past Medical History: Diagnosis Date . Blood dyscrasia  . CHF (congestive heart failure) ([place])  . COPD (chronic obstructive pulmonary disease) ([place])  . Heart murmur  . Hypertension  . Hypothyroidism  Past Surgical History: Past Surgical History: Procedure Laterality Date . CORONARY ANGIOPLASTY   . CORONARY ARTERY BYPASS GRAFT  1999 . KNEE ARTHROSCOPY   . LUMBAR SPINE SURGERY   . TONSILLECTOMY   HPI: Yvonne R Jonesis a 83 y.o.femalewith medical history significant for COPD, HTN, CAD s/p CABG in 1995,with stents, TIA symptoms 6 months ago, HLD, systolic CHF presenting with right sided weakness and dysphasia. MRI acute nonhemorrhagic left paracentral pontine infarct. Remote tiny left caudate head infarct. Remote tiny right cerebellar infarct. Mild chronic microvascular changes. CXR no acute abnormality seen. MD note the patient also with C3-C4 protrusion with moderate spinal stenosis with mild to moderate cord flattening. No Data Recorded Assessment / Plan / Recommendation CHL IP CLINICAL IMPRESSIONS 08/22/2016 Therapy Diagnosis Mild oral phase dysphagia;Mild pharyngeal  phase dysphagia Clinical Impression Pt exhibits min-mild oral and pharygneal dysphagia marked by minimally prolonged mastication, delayed transit and decreased oral cohesion (resulting in premature spill x 1 to pyriform sinuses). Flash laryngeal penetration of thin during pill trial also caused by reduced oral control/cohesion with mixed consistencies. Regular texture and thin liquids recommended, straws allowed, pills whole in applesauce. No further ST needed.  Impact on safety and function Mild aspiration risk   CHL IP TREATMENT RECOMMENDATION 08/22/2016 Treatment Recommendations No treatment recommended at this time   No flowsheet data found. CHL IP DIET RECOMMENDATION 08/22/2016 SLP Diet Recommendations Regular solids;Thin liquid Liquid Administration via Cup;Straw Medication Administration Whole meds with puree Compensations Slow rate;Small sips/bites Postural Changes Seated upright at 90 degrees   CHL IP OTHER RECOMMENDATIONS 08/22/2016 Recommended Consults -- Oral Care Recommendations Oral care BID Other Recommendations --   CHL IP FOLLOW UP RECOMMENDATIONS 08/22/2016 Follow up Recommendations None   No flowsheet data found.     CHL IP ORAL PHASE 08/22/2016 Oral Phase Impaired Oral - Pudding Teaspoon -- Oral - Pudding Cup -- Oral - Honey Teaspoon -- Oral - Honey Cup -- Oral - Nectar Teaspoon -- Oral - Nectar Cup -- Oral - Nectar Straw -- Oral - Thin Teaspoon NT Oral - Thin Cup Lingual/palatal residue Oral - Thin Straw Lingual/palatal residue Oral - Puree -- Oral - Mech Soft -- Oral - Regular Delayed oral transit Oral - Multi-Consistency -- Oral - Pill -- Oral Phase - Comment --  CHL IP PHARYNGEAL PHASE 08/22/2016 Pharyngeal Phase Impaired Pharyngeal- Pudding Teaspoon -- Pharyngeal -- Pharyngeal- Pudding Cup -- Pharyngeal -- Pharyngeal- Honey Teaspoon -- Pharyngeal -- Pharyngeal- Honey Cup -- Pharyngeal --  Pharyngeal- Nectar Teaspoon -- Pharyngeal -- Pharyngeal- Nectar Cup -- Pharyngeal -- Pharyngeal- Nectar Straw  -- Pharyngeal -- Pharyngeal- Thin Teaspoon -- Pharyngeal -- Pharyngeal- Thin Cup Delayed swallow initiation-vallecula Pharyngeal -- Pharyngeal- Thin Straw (No Data) Pharyngeal -- Pharyngeal- Puree -- Pharyngeal -- Pharyngeal- Mechanical Soft -- Pharyngeal -- Pharyngeal- Regular WFL Pharyngeal -- Pharyngeal- Multi-consistency -- Pharyngeal -- Pharyngeal- Pill Penetration/Aspiration during swallow Pharyngeal Material enters airway, remains ABOVE vocal cords then ejected out Pharyngeal Comment --  CHL IP CERVICAL ESOPHAGEAL PHASE 08/22/2016 Cervical Esophageal Phase WFL Pudding Teaspoon -- Pudding Cup -- Honey Teaspoon -- Honey Cup -- Nectar Teaspoon -- Nectar Cup -- Nectar Straw -- Thin Teaspoon -- Thin Cup -- Thin Straw -- Puree -- Mechanical Soft -- Regular -- Multi-consistency -- Pill -- Cervical Esophageal Comment -- No flowsheet data found. [place] Siren 08/22/2016, 2:55 PM Orbie Pyo Colvin Caroli.Ed CCC-SLP Pager [phone]               Recent Labs  08/23/16 0642  WBC 11.0*  HGB 11.9*  HCT 34.8*  PLT 181    Recent Labs  08/23/16 0642  NA 136  K 3.1*  CL 102  GLUCOSE 118*  BUN 5*  CREATININE 0.70  CALCIUM 8.9   CBG (last 3)  No results for input(s): GLUCAP in the last 72 hours.  Wt Readings from Last 3 Encounters:  08/24/16 68.5 kg (151 lb 0.2 oz)  08/20/16 67.2 kg (148 lb 1.6 oz)    Physical Exam:  BP (!) 134/57 (BP Location: Left Arm)   Pulse 70   Temp 97.9 F (36.6 C) (Oral)   Resp 16   Ht 5\' 4"  (1.626 m)   Wt 68.5 kg (151 lb 0.2 oz)   SpO2 94%   BMI 25.92 kg/m  Constitutional: She appears well-developedand well-nourished.  HENT: Normocephalicand atraumatic.  Eyes: EOMI. No discharge.  Cardiovascular: RRR. No JVD.  Respiratory: Effort normal. Clear GI: Soft. Bowel sounds are normal.   Musculoskeletal: No tenderness. +Edema.   Neurological: She is alert.   Right facial weakness Speech with ataxic dysarthria.  Oriented x3.  Motor: RUE: 2/5 proximal to  distal RLE: 1/5 proximal to distal (stable) Skin: Skin is warmand dry. Intact.   Assessment/Plan: 1. Functional deficits secondary to Left paramedian pontine infarct which require 3+ hours per day of interdisciplinary therapy in a comprehensive inpatient rehab setting. Physiatrist is providing close team supervision and 24 hour management of active medical problems listed below. Physiatrist and rehab team continue to assess barriers to discharge/monitor patient progress toward functional and medical goals.  Function:  Bathing Bathing position   Position: Shower  Bathing parts Body parts bathed by patient: Right arm, Chest, Abdomen, Front perineal area, Right upper leg, Left upper leg Body parts bathed by helper: Left arm, Buttocks, Right lower leg, Left lower leg, Back  Bathing assist Assist Level: Touching or steadying assistance(Pt > 75%)      Upper Body Dressing/Undressing Upper body dressing   What is the patient wearing?: Hospital gown                Upper body assist Assist Level: Touching or steadying assistance(Pt > 75%)      Lower Body Dressing/Undressing Lower body dressing   What is the patient wearing?: Non-skid slipper socks (brief donned as underwear- total A)           Non-skid slipper socks- Performed by helper: Don/doff right sock, Don/doff left sock  Lower body assist Assist for lower body dressing: Touching or steadying assistance (Pt > 75%)      Toileting Toileting   Toileting steps completed by patient: Performs perineal hygiene Toileting steps completed by helper: Adjust clothing prior to toileting, Adjust clothing after toileting Toileting Assistive Devices: Grab bar or rail  Toileting assist Assist level: Touching or steadying assistance (Pt.75%)   Transfers Chair/bed transfer   Chair/bed transfer method: Squat pivot Chair/bed transfer assist level: Maximal assist (Pt 25 - 49%/lift and lower) Chair/bed transfer  assistive device: Armrests     Locomotion Ambulation     Max distance: 8 Assist level: Maximal assist (Pt 25 - 49%)   Wheelchair   Type: Manual Max wheelchair distance: 75 Assist Level: Moderate assistance (Pt 50 - 74%)  Cognition Comprehension Comprehension assist level: Follows complex conversation/direction with no assist  Expression Expression assist level: Expresses basic needs/ideas: With extra time/assistive device  Social Interaction Social Interaction assist level: Interacts appropriately 75 - 89% of the time - Needs redirection for appropriate language or to initiate interaction.  Problem Solving Problem solving assist level: Solves complex 90% of the time/cues < 10% of the time  Memory Memory assist level: Requires cues to use assistive device     Medical Problem List and Plan: 1. 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 DVT Prophylaxis/Anticoagulation: Pharmaceutical: Lovenox 3. Chronic back pain/spasms/Pain Management: Continue flexeril prn 4. Mood: Stable. LCSW to follow for evaluation and support.  5. Neuropsych: This patient is notcapable of making decisions on herown behalf. 6. Skin/Wound Care: Routine pressure relief measures. Maintain adequate nutrition and hydration status.  7. Fluids/Electrolytes/Nutrition: Monitor I/O  Diet advanced to regular.  8. HTN: Monitor BP bid  Continue lisinopril   Improving, cont to monitor 9. 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  10. H/o asthma/Interstitial lung disease: Monitor respiratory status with increase in activity. albuterol MDI prn 11 Chronic systolic CHF: On ASA, metoprolol and zocor. Monitor for signs of overload. Heart healthy/Low salt restrictions. Discontinued IVF to avoid fluid overload.  Filed Weights   08/23/16 0518 08/24/16 0500 08/24/16 0650  Weight: 69 kg (152 lb 1.9 oz) 68.5 kg (151 lb 0.2 oz) 68.5 kg  (151 lb 0.2 oz)   12. Chronic constipation: Resumed miralax.  13. GERD: Wth chronic nausea--continue pepcid for now. Small portions at meals.   May need PPI.  14. Leukocytosis  WBCs 11.0 on 1/12  Labs ordered for Monday  Cont to monitor 15. ABLA  Hb 11.9 on 1/12  Labs ordered for Monday  Cont to monitor 16. Hypokalemia  K+ 3.1 on 1/12  Supplementing until 11/14  Labs ordered for Monday  Con to monitor 17. Sleep disturbance  Trazodone PRN  LOS (Days) 2 A FACE TO FACE EVALUATION WAS PERFORMED  Ankit Lorie Phenix 08/24/2016 12:31 PM

## 2016-08-24 NOTE — Progress Notes (Signed)
Occupational Therapy Session Note  Patient Details  Name: Yvonne Parrish MRN: [id number] Date of Birth: 1928-08-20  Today's Date: 08/24/2016 OT Individual Time: 0800-0900 and 1300-1413 OT Individual Time Calculation (min): 60 min and 73 min     Short Term Goals: Week 1:  OT Short Term Goal 1 (Week 1): Pt will transfer to toilet/ BSC with mod A  OT Short Term Goal 2 (Week 1): Pt will perform sit to stand with mod A in prep for LB dressing  OT Short Term Goal 3 (Week 1): Pt will thread LB clothing with min  A OT Short Term Goal 4 (Week 1): Pt will use right UE as gross assist with min A in functional task OT Short Term Goal 5 (Week 1): Pt will don shirt with min A   Skilled Therapeutic Interventions/Progress Updates:     Session 1: Upon entering the room, pt supine in bed with daughter present in the room. Pt declined bathing this session. Pt performed supine <sit with max A to EOB. Pt seated on EOB with supervision - steady assist with focus on hemiplegic dressing. Pt needing mod cues for dressing tasks and increased time. Max cues needed to attend and locate items on R side as well as utilize hand over hand tasks with R UE. Pt performed sit <>stand with max A for LB clothing management. Pt transferred into wheelchair with max A stand pivot transfer into wheelchair. Pt needing multiple rest breaks secondary to fatigue. Call bell and all needed items within reach upon exiting the room.    Session 2: Upon entering the room, pt seated in wheelchair upon entering the room. Pt with no c/o pain this session. OT propelled pt to gift shop via wheelchair and total A for time management. Pt propelled wheelchair on carpeted surface with mod A for 80' pt needing maximal cues to utilize hemiplegic technique. Pt required max cues to attend to R side as she would hit/run into items on right. Pt utilizing R UE to attempt to reach out and touch various items as well as therapist calling out items on the R for pt  to scan the room to locate. Pt fatigues with tasks and needing rest breaks for fatigue. Pt unable to find room in hallway secondary to pt not scanning to the R without max cues to do so. Pt transferred back into bed at end of session with max A. Her daughter remains present in the room. Call bell and all needed items within reach. OT educating pt and caregiver on R hemiplegic position in bed for safety.   Therapy Documentation Precautions:  Precautions Precautions: Fall Precaution Comments: chronic right foot drop Restrictions Weight Bearing Restrictions: No General:   Vital Signs: Therapy Vitals Temp: 97.9 F (36.6 C) Temp Source: Oral Pulse Rate: 70 Resp: 16 BP: (!) 134/57 Patient Position (if appropriate): Lying Oxygen Therapy SpO2: 94 % O2 Device: Not Delivered Pain:   ADL: ADL ADL Comments: see functional ambulation  Exercises:   Other Treatments:    See Function Navigator for Current Functional Status.   Therapy/Group: Individual Therapy  Gypsy Decant 08/24/2016, 9:39 AM

## 2016-08-25 LAB — CULTURE, BLOOD (ROUTINE X 2)
Culture: NO GROWTH
Culture: NO GROWTH

## 2016-08-25 MED ORDER — ATORVASTATIN CALCIUM 20 MG PO TABS
20.0000 mg | ORAL_TABLET | Freq: Every day | ORAL | Status: DC
  Administered 2016-08-25 – 2016-09-06 (×13): 20 mg via ORAL
  Filled 2016-08-25 (×13): qty 1

## 2016-08-25 MED ORDER — AMLODIPINE BESYLATE 2.5 MG PO TABS
2.5000 mg | ORAL_TABLET | Freq: Every day | ORAL | Status: DC
  Administered 2016-08-25 – 2016-08-26 (×2): 2.5 mg via ORAL
  Filled 2016-08-25 (×2): qty 1

## 2016-08-25 NOTE — Progress Notes (Signed)
[place] PHYSICAL MEDICINE & REHABILITATION     PROGRESS NOTE  Subjective/Complaints:  Pt seen laying in bed this AM.  Family at bedside. Pt states she slept well last night.  She notes therapies are going well.   ROS: Denies CP, SOB, N/V/D.  Objective: Vital Signs: Blood pressure (!) 163/65, pulse 76, temperature 97.8 F (36.6 C), temperature source Oral, resp. rate 18, height 5\' 4"  (1.626 m), weight 69.1 kg (152 lb 5.4 oz), SpO2 100 %. No results found.  Recent Labs  08/23/16 0642  WBC 11.0*  HGB 11.9*  HCT 34.8*  PLT 181    Recent Labs  08/23/16 0642  NA 136  K 3.1*  CL 102  GLUCOSE 118*  BUN 5*  CREATININE 0.70  CALCIUM 8.9   CBG (last 3)  No results for input(s): GLUCAP in the last 72 hours.  Wt Readings from Last 3 Encounters:  08/25/16 69.1 kg (152 lb 5.4 oz)  08/20/16 67.2 kg (148 lb 1.6 oz)    Physical Exam:  BP (!) 163/65 (BP Location: Left Arm)   Pulse 76   Temp 97.8 F (36.6 C) (Oral)   Resp 18   Ht 5\' 4"  (1.626 m)   Wt 69.1 kg (152 lb 5.4 oz)   SpO2 100%   BMI 26.15 kg/m  Constitutional: She appears well-developedand well-nourished.  HENT: Normocephalicand atraumatic.  Eyes: EOMI. No discharge.  Cardiovascular: RRR. No JVD.  Respiratory: Effort normal. Clear GI: Soft. Bowel sounds are normal.   Musculoskeletal: No tenderness. +Edema.   Neurological: She is alert.   Right facial weakness Speech with ataxic dysarthria.  Oriented x3.  Motor: RUE: 2/5 proximal to distal RLE: 1/5 proximal to distal (unchanged) Skin: Skin is warmand dry. Intact.   Assessment/Plan: 1. Functional deficits secondary to Left paramedian pontine infarct which require 3+ hours per day of interdisciplinary therapy in a comprehensive inpatient rehab setting. Physiatrist is providing close team supervision and 24 hour management of active medical problems listed below. Physiatrist and rehab team continue to assess barriers to discharge/monitor patient  progress toward functional and medical goals.  Function:  Bathing Bathing position   Position: Shower  Bathing parts Body parts bathed by patient: Right arm, Chest, Abdomen, Front perineal area, Right upper leg, Left upper leg Body parts bathed by helper: Left arm, Buttocks, Right lower leg, Left lower leg, Back  Bathing assist Assist Level: Touching or steadying assistance(Pt > 75%)      Upper Body Dressing/Undressing Upper body dressing   What is the patient wearing?: Bra, Pull over shirt/dress Bra - Perfomed by patient: Thread/unthread right bra strap Bra - Perfomed by helper: Thread/unthread left bra strap, Hook/unhook bra (pull down sports bra) Pull over shirt/dress - Perfomed by patient: Put head through opening Pull over shirt/dress - Perfomed by helper: Thread/unthread left sleeve, Thread/unthread right sleeve, Pull shirt over trunk        Upper body assist Assist Level:  (max A)      Lower Body Dressing/Undressing Lower body dressing   What is the patient wearing?: Socks, Shoes, Pants     Pants- Performed by patient: Thread/unthread left pants leg Pants- Performed by helper: Thread/unthread right pants leg, Pull pants up/down   Non-skid slipper socks- Performed by helper: Don/doff right sock, Don/doff left sock   Socks - Performed by helper: Don/doff right sock, Don/doff left sock   Shoes - Performed by helper: Don/doff right shoe, Don/doff left shoe, Fasten right, Fasten left  Lower body assist Assist for lower body dressing:  (total A)      Toileting Toileting   Toileting steps completed by patient: Performs perineal hygiene Toileting steps completed by helper: Adjust clothing prior to toileting, Adjust clothing after toileting Toileting Assistive Devices: Grab bar or rail  Toileting assist Assist level: Touching or steadying assistance (Pt.75%)   Transfers Chair/bed transfer   Chair/bed transfer method: Stand pivot Chair/bed transfer assist  level: Maximal assist (Pt 25 - 49%/lift and lower) Chair/bed transfer assistive device: Armrests     Locomotion Ambulation     Max distance: 8 Assist level: Maximal assist (Pt 25 - 49%)   Wheelchair   Type: Manual Max wheelchair distance: 80 Assist Level: Moderate assistance (Pt 50 - 74%)  Cognition Comprehension Comprehension assist level: Follows basic conversation/direction with no assist  Expression Expression assist level: Expresses basic needs/ideas: With extra time/assistive device  Social Interaction Social Interaction assist level: Interacts appropriately with others - No medications needed.  Problem Solving Problem solving assist level: Solves basic problems with no assist  Memory Memory assist level: Complete Independence: No helper     Medical Problem List and Plan: 1. 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 DVT Prophylaxis/Anticoagulation: Pharmaceutical: Lovenox 3. Chronic back pain/spasms/Pain Management: Continue flexeril prn 4. Mood: Stable. LCSW to follow for evaluation and support.  5. Neuropsych: This patient is notcapable of making decisions on herown behalf, controlled at present 6. Skin/Wound Care: Routine pressure relief measures. Maintain adequate nutrition and hydration status.  7. Fluids/Electrolytes/Nutrition: Monitor I/O  Diet advanced to regular.  8. HTN: Monitor BP bid  Continue lisinopril   Norvasc 2.5 started 1/14 9. 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  10. H/o asthma/Interstitial lung disease: Monitor respiratory status with increase in activity. albuterol MDI prn 11 Chronic systolic CHF: On ASA, metoprolol and zocor. Monitor for signs of overload. Heart healthy/Low salt restrictions. Discontinued IVF to avoid fluid overload.  Filed Weights   08/24/16 0500 08/24/16 0650 08/25/16 0500  Weight: 68.5 kg (151 lb 0.2 oz) 68.5 kg (151 lb 0.2  oz) 69.1 kg (152 lb 5.4 oz)   12. Chronic constipation: Resumed miralax.  13. GERD: Wth chronic nausea--continue pepcid for now. Small portions at meals.   May need PPI.  14. Leukocytosis  WBCs 11.0 on 1/12  Labs ordered for Monday  Cont to monitor 15. ABLA  Hb 11.9 on 1/12  Labs ordered for Monday  Cont to monitor 16. Hypokalemia  K+ 3.1 on 1/12  Supplementing until 11/14  Labs ordered for Monday  Con to monitor 17. Sleep disturbance  Trazodone PRN  Improving  LOS (Days) 3 A FACE TO FACE EVALUATION WAS PERFORMED  Marykatherine Sherwood Lorie Phenix 08/25/2016 9:16 AM

## 2016-08-26 ENCOUNTER — Inpatient Hospital Stay (HOSPITAL_COMMUNITY): Payer: Medicare Other

## 2016-08-26 ENCOUNTER — Inpatient Hospital Stay (HOSPITAL_COMMUNITY): Payer: Medicare Other | Admitting: Occupational Therapy

## 2016-08-26 ENCOUNTER — Inpatient Hospital Stay (HOSPITAL_COMMUNITY): Payer: Medicare Other | Admitting: Speech Pathology

## 2016-08-26 LAB — CBC WITH DIFFERENTIAL/PLATELET
Basophils Absolute: 0.1 10*3/uL (ref 0.0–0.1)
Basophils Relative: 1 %
Eosinophils Absolute: 0.1 10*3/uL (ref 0.0–0.7)
Eosinophils Relative: 1 %
HCT: 34.6 % — ABNORMAL LOW (ref 36.0–46.0)
Hemoglobin: 11.8 g/dL — ABNORMAL LOW (ref 12.0–15.0)
Lymphocytes Relative: 26 %
Lymphs Abs: 2.5 10*3/uL (ref 0.7–4.0)
MCH: 33.6 pg (ref 26.0–34.0)
MCHC: 34.1 g/dL (ref 30.0–36.0)
MCV: 98.6 fL (ref 78.0–100.0)
Monocytes Absolute: 0.9 10*3/uL (ref 0.1–1.0)
Monocytes Relative: 9 %
Neutro Abs: 6.2 10*3/uL (ref 1.7–7.7)
Neutrophils Relative %: 63 %
Platelets: 254 10*3/uL (ref 150–400)
RBC: 3.51 MIL/uL — ABNORMAL LOW (ref 3.87–5.11)
RDW: 12.1 % (ref 11.5–15.5)
WBC: 9.7 10*3/uL (ref 4.0–10.5)

## 2016-08-26 LAB — BASIC METABOLIC PANEL
Anion gap: 12 (ref 5–15)
BUN: 19 mg/dL (ref 6–20)
CO2: 25 mmol/L (ref 22–32)
Calcium: 9.5 mg/dL (ref 8.9–10.3)
Chloride: 98 mmol/L — ABNORMAL LOW (ref 101–111)
Creatinine, Ser: 0.95 mg/dL (ref 0.44–1.00)
GFR calc Af Amer: >60 mL/min (ref >60)
GFR calc non Af Amer: 53 mL/min — ABNORMAL LOW (ref >60)
Glucose, Bld: 72 mg/dL (ref 65–99)
Potassium: 4.4 mmol/L (ref 3.5–5.1)
Sodium: 135 mmol/L (ref 135–145)

## 2016-08-26 NOTE — Progress Notes (Signed)
Occupational Therapy Session Note  Patient Details  Name: Yvonne Parrish MRN: [id number] Date of Birth: 12/26/1928  Today's Date: 08/26/2016 OT Individual Time: 1100-1200 and 1300-1330 OT Individual Time Calculation (min): 60 min and 30 min     Short Term Goals: Week 1:  OT Short Term Goal 1 (Week 1): Pt will transfer to toilet/ BSC with mod A  OT Short Term Goal 2 (Week 1): Pt will perform sit to stand with mod A in prep for LB dressing  OT Short Term Goal 3 (Week 1): Pt will thread LB clothing with min  A OT Short Term Goal 4 (Week 1): Pt will use right UE as gross assist with min A in functional task OT Short Term Goal 5 (Week 1): Pt will don shirt with min A   Skilled Therapeutic Interventions/Progress Updates:    Session 1: Upon entering the room, pt seated in wheelchair awaiting therapist arrival. Pt declined bathing this session but requesting to change clothing. Pt engaged in sit <>stand for LB clothing management with lifting assistance. Focus on hemiplegic dressing this session and mod cues for sequencing and initiation of task. Pt engaged in grooming at sink with set up A to open containers. Pt remained seated in wheelchair with call bell and all needed items within reach.    Session 2: Upon entering the room, pt seated in wheelchair awaiting therapist Pt with no c/o pain. OT propelled pt to dayroom for time managemlent. Pt engaged AROM for R UE against gravity for elbow flexion/extension, wrist flex/ext, and digit flex/ext with focus on controlled movements and utilized L UE to continue movement to full range. Pt required rest breaks secondary to fatigue. Pt engaged in peg board task by placing board on lap and reaching laterally to obtain peg from therapist, place into board, and remove later x 5 pegs. Pt with difficulty manipulating pegs in fingers but very motivated for task. Pt returned to room at end of session with call bell and all needed items within reach.   Therapy  Documentation Precautions:  Precautions Precautions: Fall Precaution Comments: chronic right foot drop Restrictions Weight Bearing Restrictions: No General:   Vital Signs:  Pain: Pain Assessment Pain Assessment: 0-10 Pain Score: 6  Pain Type: Chronic pain Pain Location: Leg Pain Orientation: Right Pain Descriptors / Indicators: Aching Pain Frequency: Intermittent Pain Onset: On-going Patients Stated Pain Goal: 2 Pain Intervention(s): Medication (See eMAR);Repositioned Multiple Pain Sites: No ADL: ADL ADL Comments: see functional ambulation  Exercises:   Other Treatments:    See Function Navigator for Current Functional Status.   Therapy/Group: Individual Therapy  Gypsy Decant 08/26/2016, 12:36 PM

## 2016-08-26 NOTE — Progress Notes (Signed)
Physical Therapy Session Note  Patient Details  Name: Yvonne Parrish MRN: [id number] Date of Birth: 27-Dec-1928  Today's Date: 08/26/2016 PT Individual Time: [id number] PT Individual Time Calculation (min): 54 min    Short Term Goals: Week 1:  PT Short Term Goal 1 (Week 1): Patient will perform bed mobility consistently with mod assis t PT Short Term Goal 2 (Week 1): Patient will transfer with mod assist via Squat pivot.  PT Short Term Goal 3 (Week 1): Patient will perform WC moblitity x 171ft with min assist PT Short Term Goal 4 (Week 1): Patient will initate stair training  PT Short Term Goal 5 (Week 1): Patient will ambulate 2ft with mod assist from PT and LRAD  Skilled Therapeutic Interventions/Progress Updates:    Session focused on functional transfer training for simulated car transfer, w/c mobility training with hemi-technique for mobility and endurance training, neuro re-ed during sit <> stands and stair negotiation, and family education with pt's daughters in regards to home set-up, discussing home measurement sheet, and reviewing photos family brought in of patients home. Pt required max assist for transfers with cues for technique, head/hips relationship during weightshifting, and positioning of RLE during transfers. Stair negotiation x 2 steps with bilateral rails (RUE mainly supported on rail but not functionally able to use) of 3" steps to simulate home entry (only has rail on R though) with max assist for balance, facilitation for weightshift and assist to advance/clear RLE on and off of step. Daughters are very supportive and proactive in accommodating and make home set-up changes as recommended. Debbie reports she has already contacted a Chief Strategy Officer to look at shower ledge as pt was having issues entering at home PTA due to chronic R foot drop. Pt requires rest breaks due to fatigue during session.    Therapy Documentation Precautions:  Precautions Precautions: Fall Precaution  Comments: chronic right foot drop Restrictions Weight Bearing Restrictions: No  Pain: Pain Assessment Pain Assessment: 0-10 Pain Score: 6  Pain Type: Chronic pain Pain Location: Leg Pain Orientation: Right Pain Descriptors / Indicators: Aching Pain Frequency: Intermittent Pain Onset: On-going Patients Stated Pain Goal: 2 Pain Intervention(s): Medication (See eMAR);Repositioned Multiple Pain Sites: No    See Function Navigator for Current Functional Status.   Therapy/Group: Individual Therapy  Canary Brim Ivory Broad, PT, DPT  08/26/2016, 11:07 AM

## 2016-08-26 NOTE — Progress Notes (Signed)
Speech Language Pathology Daily Session Note  Patient Details  Name: Yvonne Parrish MRN: [id number] Date of Birth: 12/27/1928  Today's Date: 08/26/2016 SLP Individual Time: 1334-1430 SLP Individual Time Calculation (min): 56 min   Short Term Goals:Week 1: SLP Short Term Goal 1 (Week 1): Pt will utilize speech intelligibility strategies at the sentence level with Min A verbal cues to achieve >90% intelligibility.  SLP Short Term Goal 2 (Week 1): Pt will demonstrate self-monitoring and correction with speech intelligibility at the sentence level with Min A verbal cues.  SLP Short Term Goal 3 (Week 1): Pt will consume current diet without overt s/s of aspiration with supervision cues for use of swallowing strategies.  SLP Short Term Goal 4 (Week 1): Pt will utilize word-finding strategies during structured language activies with Mod A verbal cues.   Skilled Therapeutic Interventions:  Pt was seen for skilled ST targeting communication goals.  SLP provided skilled education regarding compensatory intelligibility strategies.  Handout was provided to maximize carryover in between therapy sessions.  SLP facilitated the session with a novel verbal description task to enforce use of strategies.  Pt needed mod assist verbal cues to slow rate and increase vocal intensity to achieve intelligibility in a moderately distracting environment; however, as environmental distractions were eliminated, therapist was able to fade cues to supervision cues for intelligibility.  Pt was returned to room and left in wheelchair with daughter at bedside.  Continue per current plan of care.         Function:  Eating Eating              Cognition Comprehension Comprehension assist level: Follows basic conversation/direction with no assist  Expression   Expression assist level: Expresses basic 75 - 89% of the time/requires cueing 10 - 24% of the time. Needs helper to occlude trach/needs to repeat words.  Social  Interaction Social Interaction assist level: Interacts appropriately with others with medication or extra time (anti-anxiety, antidepressant).  Problem Solving Problem solving assist level: Solves basic problems with no assist  Memory Memory assist level: Recognizes or recalls 90% of the time/requires cueing < 10% of the time    Pain Pain Assessment Pain Assessment: No/denies pain  Therapy/Group: Individual Therapy  Kamar Callender, Selinda Orion 08/26/2016, 2:34 PM

## 2016-08-26 NOTE — Progress Notes (Signed)
[place] PHYSICAL MEDICINE & REHABILITATION     PROGRESS NOTE  Subjective/Complaints:   LABS still pending Knee pain better with patch at night and sportscreme in am ROS: Denies CP, SOB, N/V/D.  Objective: Vital Signs: Blood pressure (!) 137/47, pulse 66, temperature 97.7 F (36.5 C), temperature source Oral, resp. rate 17, height 5\' 4"  (1.626 m), weight 69.1 kg (152 lb 5.4 oz), SpO2 95 %. No results found. No results for input(s): WBC, HGB, HCT, PLT in the last 72 hours. No results for input(s): NA, K, CL, GLUCOSE, BUN, CREATININE, CALCIUM in the last 72 hours.  Invalid input(s): CO CBG (last 3)  No results for input(s): GLUCAP in the last 72 hours.  Wt Readings from Last 3 Encounters:  08/25/16 69.1 kg (152 lb 5.4 oz)  08/20/16 67.2 kg (148 lb 1.6 oz)    Physical Exam:  BP (!) 137/47 (BP Location: Left Arm)   Pulse 66   Temp 97.7 F (36.5 C) (Oral)   Resp 17   Ht 5\' 4"  (1.626 m)   Wt 69.1 kg (152 lb 5.4 oz)   SpO2 95%   BMI 26.15 kg/m  Constitutional: She appears well-developedand well-nourished.  HENT: Normocephalicand atraumatic.  Eyes: EOMI. No discharge.  Cardiovascular: RRR. No JVD.  Respiratory: Effort normal. Clear GI: Soft. Bowel sounds are normal.   Musculoskeletal: No tenderness. +Edema.   Neurological: She is alert.   Right facial weakness Speech with ataxic dysarthria.  Oriented x3.  Motor: RUE: 2/5 proximal to distal RLE: 1/5 proximal to distal (unchanged) Skin: Skin is warmand dry. Intact.   Assessment/Plan: 1. Functional deficits secondary to Left paramedian pontine infarct which require 3+ hours per day of interdisciplinary therapy in a comprehensive inpatient rehab setting. Physiatrist is providing close team supervision and 24 hour management of active medical problems listed below. Physiatrist and rehab team continue to assess barriers to discharge/monitor patient progress toward functional and medical  goals.  Function:  Bathing Bathing position   Position: Shower  Bathing parts Body parts bathed by patient: Right arm, Chest, Abdomen, Front perineal area, Right upper leg, Left upper leg Body parts bathed by helper: Left arm, Buttocks, Right lower leg, Left lower leg, Back  Bathing assist Assist Level: Touching or steadying assistance(Pt > 75%)      Upper Body Dressing/Undressing Upper body dressing   What is the patient wearing?: Bra, Pull over shirt/dress Bra - Perfomed by patient: Thread/unthread right bra strap Bra - Perfomed by helper: Thread/unthread left bra strap, Hook/unhook bra (pull down sports bra) Pull over shirt/dress - Perfomed by patient: Put head through opening Pull over shirt/dress - Perfomed by helper: Thread/unthread left sleeve, Thread/unthread right sleeve, Pull shirt over trunk        Upper body assist Assist Level:  (max A)      Lower Body Dressing/Undressing Lower body dressing   What is the patient wearing?: Socks, Shoes, Pants     Pants- Performed by patient: Thread/unthread left pants leg Pants- Performed by helper: Thread/unthread right pants leg, Pull pants up/down   Non-skid slipper socks- Performed by helper: Don/doff right sock, Don/doff left sock   Socks - Performed by helper: Don/doff right sock, Don/doff left sock   Shoes - Performed by helper: Don/doff right shoe, Don/doff left shoe, Fasten right, Fasten left          Lower body assist Assist for lower body dressing:  (total A)      Toileting Toileting   Toileting steps completed by  patient: Performs perineal hygiene Toileting steps completed by helper: Adjust clothing prior to toileting, Adjust clothing after toileting Toileting Assistive Devices: Grab bar or rail  Toileting assist Assist level: Touching or steadying assistance (Pt.75%)   Transfers Chair/bed transfer   Chair/bed transfer method: Stand pivot Chair/bed transfer assist level: Maximal assist (Pt 25 - 49%/lift  and lower) Chair/bed transfer assistive device: Armrests     Locomotion Ambulation     Max distance: 8 Assist level: Maximal assist (Pt 25 - 49%)   Wheelchair   Type: Manual Max wheelchair distance: 80 Assist Level: Moderate assistance (Pt 50 - 74%)  Cognition Comprehension Comprehension assist level: Follows basic conversation/direction with no assist  Expression Expression assist level: Expresses basic needs/ideas: With extra time/assistive device  Social Interaction Social Interaction assist level: Interacts appropriately with others - No medications needed.  Problem Solving Problem solving assist level: Solves basic problems with no assist  Memory Memory assist level: Complete Independence: No helper     Medical Problem List and Plan: 1. Right hemiparesis secondary to Left paramedian pontine infarct  Cont CIR PT, OT  Bracing ordered  Fluoxetine started 1/14 2. DVT Prophylaxis/Anticoagulation: Pharmaceutical: Lovenox 3. Chronic back pain/spasms/Pain Management: Continue flexeril prn 4. Mood: Stable. LCSW to follow for evaluation and support.  5. Neuropsych: This patient is notcapable of making decisions on herown behalf, controlled at present 6. Skin/Wound Care: Routine pressure relief measures. Maintain adequate nutrition and hydration status.  7. Fluids/Electrolytes/Nutrition: Monitor I/O  Diet advanced to regular.  8. HTN: Monitor BP bid  Continue lisinopril   Norvasc 2.5 started 1/14 9. 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  10. H/o asthma/Interstitial lung disease: Monitor respiratory status with increase in activity. albuterol MDI prn 11 Chronic systolic CHF: On ASA, metoprolol and zocor. Monitor for signs of overload. Heart healthy/Low salt restrictions. Discontinued IVF to avoid fluid overload.  Filed Weights   08/24/16 0500 08/24/16 0650 08/25/16 0500  Weight: 68.5 kg (151 lb 0.2 oz) 68.5 kg (151 lb 0.2 oz) 69.1 kg (152 lb 5.4 oz)    12. Chronic constipation: Resumed miralax.  13. GERD: Wth chronic nausea--continue pepcid for now. Small portions at meals.   May need PPI.  14. Leukocytosis  WBCs 11.0 on 1/12  Labs pending  Cont to monitor 15. ABLA  Hb 11.9 on 1/12  Labs pending  Cont to monitor 16. Hypokalemia  K+ 3.1 on 1/12  Supplementing until 11/14  Labs ordered for Monday  Con to monitor 17. Sleep disturbance  Trazodone PRN  Improving  LOS (Days) 4 A FACE TO FACE EVALUATION WAS PERFORMED  Charlett Blake 08/26/2016 9:32 AM

## 2016-08-27 ENCOUNTER — Inpatient Hospital Stay (HOSPITAL_COMMUNITY): Payer: Medicare Other | Admitting: Speech Pathology

## 2016-08-27 ENCOUNTER — Inpatient Hospital Stay (HOSPITAL_COMMUNITY): Payer: Medicare Other | Admitting: Occupational Therapy

## 2016-08-27 ENCOUNTER — Inpatient Hospital Stay (HOSPITAL_COMMUNITY): Payer: Medicare Other | Admitting: Physical Therapy

## 2016-08-27 MED ORDER — AMLODIPINE BESYLATE 5 MG PO TABS
5.0000 mg | ORAL_TABLET | Freq: Every day | ORAL | Status: DC
  Administered 2016-08-27 – 2016-09-07 (×11): 5 mg via ORAL
  Filled 2016-08-27 (×12): qty 1

## 2016-08-27 NOTE — Progress Notes (Signed)
Physical Therapy Session Note  Patient Details  Name: Yvonne Parrish MRN: [id number] Date of Birth: 07-Oct-1928  Today's Date: 08/27/2016 PT Individual Time: 0800-0900 PT Individual Time Calculation (min): 60 min    Short Term Goals: Week 1:  PT Short Term Goal 1 (Week 1): Patient will perform bed mobility consistently with mod assis t PT Short Term Goal 2 (Week 1): Patient will transfer with mod assist via Squat pivot.  PT Short Term Goal 3 (Week 1): Patient will perform WC moblitity x 160ft with min assist PT Short Term Goal 4 (Week 1): Patient will initate stair training  PT Short Term Goal 5 (Week 1): Patient will ambulate 85ft with mod assist from PT and LRAD  Skilled Therapeutic Interventions/Progress Updates:   Pt presented in bed agreeable to therapy. Performed log rolling L/R with modA to R, maxA to L for changing brief and donning pants. Supine to sit modA with multimodal cues for use of bed rails, hand placement and LE placement. Performed squat pivot transfer maxA.  W/c propulsion with hemi technique 62ft with min cues for technique and negotiation. Performed standing at parallel bars with maxA sit to stand. Cues for increased wt shifting to RLE. Pt able to maintain standing up to 1 min before fatigue. Pt returned to room via total assist 2/2 fatigue and remained in chair awaiting next session with call bell within reach and family present.   Therapy Documentation Precautions:  Precautions Precautions: Fall Precaution Comments: chronic right foot drop Restrictions Weight Bearing Restrictions: No   See Function Navigator for Current Functional Status.   Therapy/Group: Individual Therapy  Jasleen Riepe  Commodore Bellew, PTA  08/27/2016, 9:20 AM

## 2016-08-27 NOTE — Progress Notes (Signed)
Occupational Therapy Session Note  Patient Details  Name: Yvonne Parrish MRN: [id number] Date of Birth: 1928/08/30  Today's Date: 08/27/2016 OT Individual Time: 0915-1000 and 1335-1418 OT Individual Time Calculation (min): 45 min and 43 min    Short Term Goals: Week 1:  OT Short Term Goal 1 (Week 1): Pt will transfer to toilet/ BSC with mod A  OT Short Term Goal 2 (Week 1): Pt will perform sit to stand with mod A in prep for LB dressing  OT Short Term Goal 3 (Week 1): Pt will thread LB clothing with min  A OT Short Term Goal 4 (Week 1): Pt will use right UE as gross assist with min A in functional task OT Short Term Goal 5 (Week 1): Pt will don shirt with min A   Skilled Therapeutic Interventions/Progress Updates:    1) Treatment session with focus on RUE NMR with focus on gross and fine motor control.  Pt received in w/c upon arrival deferring bathing and dressing at this time.  Completed stand pivot transfer to therapy mat with mod assist.  Engaged in Bray with use of UE Ranger with focus on shoulder flexion and elbow extension as well as horizontal abduction/adduction.  Pt required tactile cues and support at Rt elbow to decrease effects of gravity.  Educated on exercises to complete during down time with focus on finger flexion/extension with towel scrunches, finger abduction/adduction, tapping, and thumb opposition.  Pt requires increased time to elicit finger extension but is able to achieve with increased time and encouragement.  Incorporate finger flexion into grasp/release activity with pt able to pick up large pegs from peg board and place on table, requiring increased time for release.  Encouraged pt to engage in [facility] HEP handout provided during downtime as pt and her daughter report RUE weakness is pt's biggest frustration at this time.  2) Treatment session with focus on functional transfers and RUE NMR.  Completed squat pivot transfers and bed mobility with mod assist.  In supine  on therapy mat, engaged in RUE NMR with focus on shoulder strengthening and motor control with flexion/extension, elbow control with extension against gravity and sustained control of RUE while extended against gravity.  Engaged in PNF pattern reaching in supine with LUE support to increase functional movements and motor control.  Multiple rest breaks provided throughout due to fatigue at shoulder joint.  Returned to sitting EOB with mod assist and cues for sequencing.  Squat pivot transfer mat > w/c > bed and left with necessary items in reach and daughter present.  Therapy Documentation Precautions:  Precautions Precautions: Fall Precaution Comments: chronic right foot drop Restrictions Weight Bearing Restrictions: No General:   Vital Signs:  Pain:    See Function Navigator for Current Functional Status.   Therapy/Group: Individual Therapy  Simonne Come 08/27/2016, 10:11 AM

## 2016-08-27 NOTE — Progress Notes (Signed)
Speech Language Pathology Daily Session Note  Patient Details  Name: Yvonne Parrish MRN: [id number] Date of Birth: [birth year]  Today's Date: 08/27/2016 SLP Individual Time: 1100-1200 SLP Individual Time Calculation (min): 60 min   Short Term Goals: Week 1: SLP Short Term Goal 1 (Week 1): Pt will utilize speech intelligibility strategies at the sentence level with Min A verbal cues to achieve >90% intelligibility.  SLP Short Term Goal 2 (Week 1): Pt will demonstrate self-monitoring and correction with speech intelligibility at the sentence level with Min A verbal cues.  SLP Short Term Goal 3 (Week 1): Pt will consume current diet without overt s/s of aspiration with supervision cues for use of swallowing strategies.  SLP Short Term Goal 4 (Week 1): Pt will utilize word-finding strategies during structured language activies with Mod A verbal cues.   Skilled Therapeutic Interventions: Pt was seen for skilled ST targeting communication goals.  Pt required min assist question cues to recall speech intelligibility strategies provided during yesterday's therapy session.  Pt was overall supervision level assist for use of increased vocal intensity, slow rate, and overarticulation to achieve intelligibility in conversations during a novel picture description task in a mildly noisy environment.  SLP also noted mild word finding difficulty impacting function during task.  Pt reports this to be an ongoing issue from prior to admission and was able to verbalize 2 compensatory strategies with mod I.  SLP also provided skilled education for additional compensatory strategies, including description, process of elimination, and phonemic cues to maximize word finding.  Pt then utilized strategies in a structured categorical naming task with min assist verbal cues.  Pt was returned to room and left in wheelchair with daughter at bedside.  Continue per current plan of care.    Function:  Eating Eating                 Cognition Comprehension Comprehension assist level: Follows basic conversation/direction with no assist  Expression   Expression assist level: Expresses basic 75 - 89% of the time/requires cueing 10 - 24% of the time. Needs helper to occlude trach/needs to repeat words.  Social Interaction Social Interaction assist level: Interacts appropriately with others with medication or extra time (anti-anxiety, antidepressant).  Problem Solving Problem solving assist level: Solves basic problems with no assist  Memory Memory assist level: Recognizes or recalls 90% of the time/requires cueing < 10% of the time    Pain Pain Assessment Pain Assessment: No/denies pain  Therapy/Group: Individual Therapy  Khole Branch, Selinda Orion 08/27/2016, 2:40 PM

## 2016-08-27 NOTE — Progress Notes (Signed)
[facility] PHYSICAL MEDICINE & REHABILITATION     PROGRESS NOTE  Subjective/Complaints:  No problems overnite, denies bowel or bladder issues ROS: Denies CP, SOB, N/V/D.  Objective: Vital Signs: Blood pressure (!) 182/56, pulse 71, temperature 98 F (36.7 C), temperature source Oral, resp. rate 16, height 5\' 4"  (1.626 m), weight 70 kg (154 lb 5.2 oz), SpO2 99 %. No results found.  Recent Labs  08/26/16 1201  WBC 9.7  HGB 11.8*  HCT 34.6*  PLT 254    Recent Labs  08/26/16 1201  NA 135  K 4.4  CL 98*  GLUCOSE 72  BUN 19  CREATININE 0.95  CALCIUM 9.5   CBG (last 3)  No results for input(s): GLUCAP in the last 72 hours.  Wt Readings from Last 3 Encounters:  08/27/16 70 kg (154 lb 5.2 oz)  08/20/16 67.2 kg (148 lb 1.6 oz)    Physical Exam:  BP (!) 182/56 (BP Location: Left Arm)   Pulse 71   Temp 98 F (36.7 C) (Oral)   Resp 16   Ht 5\' 4"  (1.626 m)   Wt 70 kg (154 lb 5.2 oz)   SpO2 99%   BMI 26.49 kg/m  Constitutional: She appears well-developedand well-nourished.  HENT: Normocephalicand atraumatic.  Eyes: EOMI. No discharge.  Cardiovascular: RRR. No JVD.  Respiratory: Effort normal. Clear GI: Soft. Bowel sounds are normal.   Musculoskeletal: No tenderness. +Edema.   Neurological: She is alert.   Right facial weakness Speech with ataxic dysarthria.  Oriented x3.  Motor: RUE: 2/5 proximal to distal RLE: 1/5 proximal to distal (unchanged) Skin: Skin is warmand dry. Intact.   Assessment/Plan: 1. Functional deficits secondary to Left paramedian pontine infarct which require 3+ hours per day of interdisciplinary therapy in a comprehensive inpatient rehab setting. Physiatrist is providing close team supervision and 24 hour management of active medical problems listed below. Physiatrist and rehab team continue to assess barriers to discharge/monitor patient progress toward functional and medical goals.  Function:  Bathing Bathing position    Position: Shower  Bathing parts Body parts bathed by patient: Right arm, Chest, Abdomen, Front perineal area, Right upper leg, Left upper leg Body parts bathed by helper: Left arm, Buttocks, Right lower leg, Left lower leg, Back  Bathing assist Assist Level:  (60%, moderate assist)      Upper Body Dressing/Undressing Upper body dressing   What is the patient wearing?: Pull over shirt/dress Bra - Perfomed by patient: Thread/unthread right bra strap Bra - Perfomed by helper: Thread/unthread left bra strap, Hook/unhook bra (pull down sports bra) Pull over shirt/dress - Perfomed by patient: Thread/unthread right sleeve, Thread/unthread left sleeve, Pull shirt over trunk Pull over shirt/dress - Perfomed by helper: Put head through opening        Upper body assist Assist Level:  (min A)      Lower Body Dressing/Undressing Lower body dressing   What is the patient wearing?: Pants     Pants- Performed by patient: Thread/unthread left pants leg Pants- Performed by helper: Pull pants up/down, Thread/unthread left pants leg, Thread/unthread right pants leg   Non-skid slipper socks- Performed by helper: Don/doff right sock, Don/doff left sock   Socks - Performed by helper: Don/doff right sock, Don/doff left sock   Shoes - Performed by helper: Don/doff right shoe, Don/doff left shoe, Fasten right, Fasten left          Lower body assist Assist for lower body dressing:  (total A)  Toileting Toileting   Toileting steps completed by patient: Performs perineal hygiene Toileting steps completed by helper: Adjust clothing prior to toileting, Performs perineal hygiene, Adjust clothing after toileting Toileting Assistive Devices: Grab bar or rail  Toileting assist Assist level: Two helpers   Transfers Chair/bed transfer   Chair/bed transfer method: Stand pivot Chair/bed transfer assist level: Maximal assist (Pt 25 - 49%/lift and lower) Chair/bed transfer assistive device: Armrests      Locomotion Ambulation     Max distance: 8 Assist level: Maximal assist (Pt 25 - 49%)   Wheelchair   Type: Manual Max wheelchair distance: 32' Assist Level: Supervision or verbal cues  Cognition Comprehension Comprehension assist level: Follows basic conversation/direction with no assist  Expression Expression assist level: Expresses basic 75 - 89% of the time/requires cueing 10 - 24% of the time. Needs helper to occlude trach/needs to repeat words.  Social Interaction Social Interaction assist level: Interacts appropriately with others with medication or extra time (anti-anxiety, antidepressant).  Problem Solving Problem solving assist level: Solves basic problems with no assist  Memory Memory assist level: Recognizes or recalls 90% of the time/requires cueing < 10% of the time     Medical Problem List and Plan: 1. Right hemiparesis secondary to Left paramedian pontine infarct  Cont CIR PT, OT  Bracing ordered  Fluoxetine started 1/14 2. DVT Prophylaxis/Anticoagulation: Pharmaceutical: Lovenox 3. Chronic back pain/spasms/Pain Management: Continue flexeril prn 4. Mood: Stable. LCSW to follow for evaluation and support.  5. Neuropsych: This patient is notcapable of making decisions on herown behalf, controlled at present 6. Skin/Wound Care: Routine pressure relief measures. Maintain adequate nutrition and hydration status.  7. Fluids/Electrolytes/Nutrition: Monitor I/O  Diet advanced to regular.  8. HTN: Monitor BP bid  Continue lisinopril   Norvasc 2.5 started 1/14, increase to 5mg  9. 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  10. H/o asthma/Interstitial lung disease: Monitor respiratory status with increase in activity. albuterol MDI prn 11 Chronic systolic CHF: On ASA, metoprolol and zocor. Monitor for signs of overload. Heart healthy/Low salt restrictions. Discontinued IVF to avoid fluid overload.  Filed Weights   08/24/16 0650 08/25/16 0500  08/27/16 0547  Weight: 68.5 kg (151 lb 0.2 oz) 69.1 kg (152 lb 5.4 oz) 70 kg (154 lb 5.2 oz)   12. Chronic constipation: Resumed miralax.  13. GERD: Wth chronic nausea--continue pepcid for now. Small portions at meals.   May need PPI.  14. Leukocytosis resolved   WBCs 9.7 on 1/12   Cont to monitor 15. ABLA  Hb 11.9 on 1/12, 11.8 on 1/15    Cont to monitor 16. Hypokalemia  K+ 4.4 on 1/15  Supplementing until 11/14  Labs ordered for Monday  Con to monitor 17. Sleep disturbance  Trazodone PRN  Improving  LOS (Days) 5 A FACE TO FACE EVALUATION WAS PERFORMED  Charlett Blake 08/27/2016 7:29 AM

## 2016-08-28 ENCOUNTER — Inpatient Hospital Stay (HOSPITAL_COMMUNITY): Payer: Medicare Other | Admitting: Physical Therapy

## 2016-08-28 ENCOUNTER — Inpatient Hospital Stay (HOSPITAL_COMMUNITY): Payer: Medicare Other | Admitting: Speech Pathology

## 2016-08-28 ENCOUNTER — Ambulatory Visit (HOSPITAL_COMMUNITY): Payer: Medicare Other | Admitting: Physical Therapy

## 2016-08-28 ENCOUNTER — Inpatient Hospital Stay (HOSPITAL_COMMUNITY): Payer: Medicare Other | Admitting: Occupational Therapy

## 2016-08-28 LAB — GLUCOSE, CAPILLARY: Glucose-Capillary: 109 mg/dL — ABNORMAL HIGH (ref 65–99)

## 2016-08-28 MED ORDER — ENSURE ENLIVE PO LIQD
237.0000 mL | Freq: Three times a day (TID) | ORAL | Status: DC
  Administered 2016-08-28 – 2016-09-05 (×3): 237 mL via ORAL

## 2016-08-28 NOTE — Progress Notes (Signed)
Physical Therapy Session Note  Patient Details  Name: Yvonne Parrish MRN: [id number] Date of Birth: August 13, 1928  Today's Date: 08/28/2016 PT Individual Time: 9937-1696 PT Individual Time Calculation (min): 59 min    Short Term Goals:Week 1:  PT Short Term Goal 1 (Week 1): Patient will perform bed mobility consistently with mod assis t PT Short Term Goal 2 (Week 1): Patient will transfer with mod assist via Squat pivot.  PT Short Term Goal 3 (Week 1): Patient will perform WC moblitity x 18f with min assist PT Short Term Goal 4 (Week 1): Patient will initate stair training  PT Short Term Goal 5 (Week 1): Patient will ambulate 259fwith mod assist from PT and LRAD  Skilled Therapeutic Interventions/Progress Updates:  Pt resting in w/c on arrival no c/o pain except discomfort in neck, not improved since AM session, agreeable to therapy.  Session focus on activity tolerance, w/c propulsion, transfers, and ambulation.    Pt propelled w/c to therapy gym with overall supervision and more than a reasonable amount of time using L hemi technique for mobility and activity tolerance.  NMR for R quad/hamstring activation with 8 reps LAQ focus on controlled lower.  Gait training x 30' with RW with handsplint with mod assist to advance/position RLE.  Attempted ambulation with R PLS AFO and medial heel wedge for ankle positioning but pt with medial support of AFO causing discomfort with ambulation.  WIll continue to trial bracing for improved gait as AFO did help with swing through.  Pt returned to room at end of session and positioned with call bell in reach and needs met.      Therapy Documentation Precautions:  Precautions Precautions: Fall Precaution Comments: chronic right foot drop Restrictions Weight Bearing Restrictions: No  See Function Navigator for Current Functional Status.   Therapy/Group: Individual Therapy  Trinitey Roache E Penven-Crew 08/28/2016, 1:21 PM

## 2016-08-28 NOTE — Progress Notes (Signed)
Social Work Patient ID: Yvonne Parrish, female   DOB: 1929/05/15, 83 y.o.   MRN: [id number]  Met with pt and daughter to discuss team conference goals min assist overall and target discharge date 2/1. Pt pleased with having a date to work toward. She would like to be progressing more than she is but can see improvement. Daughter points out she is making progress which is a good sign.  Pt acknowledges fatigues easily and is working on this. Daughter is staying with her makes her feel better and participates in therapies with her.

## 2016-08-28 NOTE — Progress Notes (Signed)
Speech Language Pathology Daily Session Note  Patient Details  Name: Yvonne Parrish MRN: [id number] Date of Birth: 16-May-1929  Today's Date: 08/28/2016 SLP Individual Time: 1433-1500 SLP Individual Time Calculation (min): 27 min   Short Term Goals: Week 1: SLP Short Term Goal 1 (Week 1): Pt will utilize speech intelligibility strategies at the sentence level with Min A verbal cues to achieve >90% intelligibility.  SLP Short Term Goal 2 (Week 1): Pt will demonstrate self-monitoring and correction with speech intelligibility at the sentence level with Min A verbal cues.  SLP Short Term Goal 3 (Week 1): Pt will consume current diet without overt s/s of aspiration with supervision cues for use of swallowing strategies.  SLP Short Term Goal 4 (Week 1): Pt will utilize word-finding strategies during structured language activies with Mod A verbal cues.   Skilled Therapeutic Interventions: Pt was seen for skilled ST targeting dysphagia goals.  SLP facilitated the session with a functional snack of regular textures and thin liquids to address toleration of current diet.  Pt consumed graham crackers with peanut butter and water via straw with mod I use of swallowing precautions and no overt s/s of aspiration.  Pt demonstrated appropriate clearance of solids from the oral cavity despite multiple distractions provided by therapist during snack.  Do not anticipate that pt iwll need additional follow up for dysphagia during meals but should be observed at least once during med administration to ensure toleration of meds whole with liquids as this was her primary means of taking meds prior to admission.  Pt was left in wheelchair with family member at bedside.  Continue per current plan of care.    Function:  Eating Eating   Modified Consistency Diet: No Eating Assist Level: More than reasonable amount of time           Cognition Comprehension Comprehension assist level: Follows basic  conversation/direction with no assist  Expression   Expression assist level: Expresses basic 90% of the time/requires cueing < 10% of the time.  Social Interaction Social Interaction assist level: Interacts appropriately with others with medication or extra time (anti-anxiety, antidepressant).  Problem Solving Problem solving assist level: Solves basic problems with no assist  Memory Memory assist level: Recognizes or recalls 90% of the time/requires cueing < 10% of the time    Pain Pain Assessment Pain Assessment: Faces Pain Score: 3  Faces Pain Scale: Hurts little more Pain Type: Acute pain Pain Location: Back Pain Descriptors / Indicators: Aching Pain Intervention(s): RN made aware;Heat applied  Therapy/Group: Individual Therapy  Alondra Vandeven, Selinda Orion 08/28/2016, 6:24 PM

## 2016-08-28 NOTE — Patient Care Conference (Signed)
Inpatient RehabilitationTeam Conference and Plan of Care Update Date: 08/28/2016   Time: 11:30 AM    Patient Name: Yvonne Parrish      Medical Record Number: [id number]  Date of Birth: [birth year] Sex: Female         Room/Bed: 4W23C/4W23C-01 Payor Info: Payor: [employer] MEDICARE / Plan: [employer] MEDICARE / Product Type: *No Product type* /    Admitting Diagnosis: CVA  Admit Date/Time:  08/22/2016  6:22 PM Admission Comments: No comment available   Primary Diagnosis:  <principal problem not specified> Principal Problem: <principal problem not specified>  Patient Active Problem List   Diagnosis Date Noted  . Benign essential HTN   . Chronic systolic CHF (congestive heart failure) ([place])   . Acute blood loss anemia   . Hypokalemia   . Sleep disturbance   . Gait disturbance, post-stroke 08/22/2016  . Left pontine stroke ([place]) 08/22/2016  . Slurred speech   . Chronic obstructive pulmonary disease ([place])   . Coronary artery disease involving coronary bypass graft of native heart without angina pectoris   . Leukocytosis   . Hyperglycemia   . Right hemiparesis ([place])   . COPD exacerbation ([place]) 08/20/2016  . Aphasia 08/20/2016  . Asthma without status asthmaticus 08/20/2016  . CHF (congestive heart failure) ([place]) 08/20/2016  . Hyperlipidemia, unspecified 08/20/2016  . Hypertension 08/20/2016  . Myelodysplastic syndrome ([place]) 08/20/2016  . Osteoporosis, post-menopausal 08/20/2016  . Stroke (cerebrum) ([place]) 08/20/2016  . Ischemic stroke ([place])   . Cardiomyopathy, dilated ([place]) 01/10/2016  . H/O cardiac catheterization 01/10/2016  . Chronic back pain 11/01/2014  . History of coronary artery bypass graft 11/01/2014  . Acquired hypothyroidism 07/05/2014    Expected Discharge Date: Expected Discharge Date: 09/12/16  Team Members Present: Physician leading conference: Dr. Alysia Penna Social Worker Present: Ovidio Kin, LCSW Nurse Present: Junius Creamer, RN PT Present: Jorge Mandril, PT;Caitlin Penven-Crew, PT OT Present: Simonne Come, OT SLP Present: Windell Moulding, SLP PPS Coordinator present : Ileana Ladd, PT     Current Status/Progress Goal Weekly Team Focus  Medical   pain ok, bowels ok, BP fluctuates but no dizziness  improve level of activity tolerance, stable BP  manage BP   Bowel/Bladder   Continent of bowel & bladder, LBM 08/27/16  continue to be continent of Bowel & bladder  Continue to monitor & assist as needed   Swallow/Nutrition/ Hydration   regular textures, thin liquids; intermittent supervision   mod I   follow up for toleration   ADL's   mod assist bathing, max-total assist dressing, mod-max assist squat pivot transfers  min assist overall, supervision seated tasks (grooming, UB dressing)  ADL retraining, transfers, RUE NMR   Mobility   min assist>mod assist  supevision overall, min on stairs and in community  balance, activity tolerance, strengthening, functional mobility   Communication   mild word finding impairment and dysarthria  supervision   education and carryover of compensatory strategies    Safety/Cognition/ Behavioral Observations            Pain   Pain scale ranges from 4-7, has prn tylenol & schedule tylenol at hs. last asked for prn tylenol on 08/26/16 for right leg pain  pain scale less than 4  assess & treat as needed   Skin   MASD bil buttocks, blanchable skin  no new skin breakdown  assess q shift      *See Care Plan and progress notes for long and short-term goals.  Barriers to Discharge:  see above, fatigue    Possible Resolutions to Barriers:  adjust schedule, slowly build endurance    Discharge Planning/Teaching Needs:  Daughter's working on a plan for 24 hr care-made aware she will need this upon discharge from rehab.      Team Discussion:  Goals min assist overall-currently is mod-max assist level. Making progress with sequencing and making slow gains. She fatigues easily will space out therapies. MD  checking BP due to fluctuating. Aware of neck pain. Pt's pain in right leg is being managed. Daughter's aware of pt's need for 24 hr care at discharge will begin working on.  Revisions to Treatment Plan:  None   Continued Need for Acute Rehabilitation Level of Care: The patient requires daily medical management by a physician with specialized training in physical medicine and rehabilitation for the following conditions: Daily direction of a multidisciplinary physical rehabilitation program to ensure safe treatment while eliciting the highest outcome that is of practical value to the patient.: Yes Daily medical management of patient stability for increased activity during participation in an intensive rehabilitation regime.: Yes Daily analysis of laboratory values and/or radiology reports with any subsequent need for medication adjustment of medical intervention for : Neurological problems;Blood pressure problems;Other  [name], [name] 08/30/2016, 1:08 PM

## 2016-08-28 NOTE — Progress Notes (Signed)
Recreational Therapy Session Note  Patient Details  Name: Yvonne Parrish MRN: [id number] Date of Birth: 04-19-1929 Today's Date: 08/28/2016   Met with pt during cotreat with PT today.  Full eval deferred at this time due low activity tolerance and limited leisure interest.  Will continue to monitor through team for future TR group participation. Yvonne Parrish 08/28/2016, 1:10 PM

## 2016-08-28 NOTE — Progress Notes (Signed)
[facility] PHYSICAL MEDICINE & REHABILITATION     PROGRESS NOTE  Subjective/Complaints:  Sitting in WC, reading, no c/os ROS: Denies CP, SOB, N/V/D.  Objective: Vital Signs: Blood pressure (!) 152/45, pulse 73, temperature 98.3 F (36.8 C), temperature source Oral, resp. rate 18, height 5\' 4"  (1.626 m), weight 70.2 kg (154 lb 12.2 oz), SpO2 94 %. No results found.  Recent Labs  08/26/16 1201  WBC 9.7  HGB 11.8*  HCT 34.6*  PLT 254    Recent Labs  08/26/16 1201  NA 135  K 4.4  CL 98*  GLUCOSE 72  BUN 19  CREATININE 0.95  CALCIUM 9.5   CBG (last 3)  No results for input(s): GLUCAP in the last 72 hours.  Wt Readings from Last 3 Encounters:  08/28/16 70.2 kg (154 lb 12.2 oz)  08/20/16 67.2 kg (148 lb 1.6 oz)    Physical Exam:  BP (!) 152/45 (BP Location: Left Arm)   Pulse 73   Temp 98.3 F (36.8 C) (Oral)   Resp 18   Ht 5\' 4"  (1.626 m)   Wt 70.2 kg (154 lb 12.2 oz)   SpO2 94%   BMI 26.57 kg/m  Constitutional: She appears well-developedand well-nourished.  HENT: Normocephalicand atraumatic.  Eyes: EOMI. No discharge.  Cardiovascular: RRR. No JVD.  Respiratory: Effort normal. Clear GI: Soft. Bowel sounds are normal.   Musculoskeletal: No tenderness. +Edema.   Neurological: She is alert.   Right facial weakness Speech with ataxic dysarthria.  Oriented x3.  Motor: RUE: 2/5 proximal to distal RLE: 1/5 proximal to distal (unchanged) Skin: Skin is warmand dry. Intact.   Assessment/Plan: 1. Functional deficits secondary to Left paramedian pontine infarct which require 3+ hours per day of interdisciplinary therapy in a comprehensive inpatient rehab setting. Physiatrist is providing close team supervision and 24 hour management of active medical problems listed below. Physiatrist and rehab team continue to assess barriers to discharge/monitor patient progress toward functional and medical goals.  Function:  Bathing Bathing position   Position:  Shower  Bathing parts Body parts bathed by patient: Right arm, Chest, Abdomen, Front perineal area, Right upper leg, Left upper leg, Buttocks Body parts bathed by helper: Left arm, Right lower leg, Left lower leg, Back  Bathing assist Assist Level:  (Mod assist)      Upper Body Dressing/Undressing Upper body dressing   What is the patient wearing?: Bra, Pull over shirt/dress Bra - Perfomed by patient: Thread/unthread right bra strap, Thread/unthread left bra strap Bra - Perfomed by helper: Hook/unhook bra (pull down sports bra) Pull over shirt/dress - Perfomed by patient: Thread/unthread right sleeve, Thread/unthread left sleeve, Pull shirt over trunk, Put head through opening Pull over shirt/dress - Perfomed by helper: Put head through opening        Upper body assist Assist Level: Touching or steadying assistance(Pt > 75%)      Lower Body Dressing/Undressing Lower body dressing   What is the patient wearing?: Pants     Pants- Performed by patient: Thread/unthread left pants leg Pants- Performed by helper: Pull pants up/down, Thread/unthread left pants leg, Thread/unthread right pants leg   Non-skid slipper socks- Performed by helper: Don/doff right sock, Don/doff left sock   Socks - Performed by helper: Don/doff right sock, Don/doff left sock   Shoes - Performed by helper: Don/doff right shoe, Don/doff left shoe, Fasten right, Fasten left          Lower body assist Assist for lower body dressing:  (total A)  Toileting Toileting   Toileting steps completed by patient: Performs perineal hygiene Toileting steps completed by helper: Adjust clothing after toileting, Performs perineal hygiene, Adjust clothing prior to toileting Toileting Assistive Devices: Grab bar or rail  Toileting assist Assist level: Two helpers   Transfers Chair/bed transfer   Chair/bed transfer method: Stand pivot Chair/bed transfer assist level: Maximal assist (Pt 25 - 49%/lift and  lower) Chair/bed transfer assistive device: Mechanical lift Mechanical lift: Stedy   Locomotion Ambulation     Max distance: 8 Assist level: Maximal assist (Pt 25 - 49%)   Wheelchair   Type: Manual Max wheelchair distance: 65' Assist Level: Supervision or verbal cues  Cognition Comprehension Comprehension assist level: Follows basic conversation/direction with no assist  Expression Expression assist level: Expresses basic 75 - 89% of the time/requires cueing 10 - 24% of the time. Needs helper to occlude trach/needs to repeat words.  Social Interaction Social Interaction assist level: Interacts appropriately with others with medication or extra time (anti-anxiety, antidepressant).  Problem Solving Problem solving assist level: Solves basic problems with no assist  Memory Memory assist level: Recognizes or recalls 90% of the time/requires cueing < 10% of the time     Medical Problem List and Plan: 1. Right hemiparesis secondary to Left paramedian pontine infarct  Cont CIR PT, OT  Bracing ordered  Fluoxetine started 1/14 2. DVT Prophylaxis/Anticoagulation: Pharmaceutical: Lovenox 3. Chronic back pain/spasms/Pain Management: Continue flexeril prn 4. Mood: Stable. LCSW to follow for evaluation and support.  5. Neuropsych: This patient is notcapable of making decisions on herown behalf, controlled at present 6. Skin/Wound Care: Routine pressure relief measures. Maintain adequate nutrition and hydration status.  7. Fluids/Electrolytes/Nutrition: Monitor I/O  Diet advanced to regular.  8. HTN: Monitor BP bid  Continue lisinopril   Norvasc 2.5 started 1/14, increase to 5mg , variable cont to monitor, check ortho vitals Vitals:   08/27/16 1439 08/28/16 0450  BP: (!) 90/50 (!) 152/45  Pulse: 90 73  Resp: 16 18  Temp: 98.4 F (36.9 C) 98.3 F (36.8 C)   9. 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  10. H/o asthma/Interstitial lung disease: Monitor respiratory  status with increase in activity. albuterol MDI prn 11 Chronic systolic CHF: On ASA, metoprolol and zocor. Monitor for signs of overload. Heart healthy/Low salt restrictions. Discontinued IVF to avoid fluid overload.  Filed Weights   08/25/16 0500 08/27/16 0547 08/28/16 0450  Weight: 69.1 kg (152 lb 5.4 oz) 70 kg (154 lb 5.2 oz) 70.2 kg (154 lb 12.2 oz)   12. Chronic constipation: Resumed miralax.  13. GERD: Wth chronic nausea--continue pepcid for now. Small portions at meals.   May need PPI.  14. Leukocytosis resolved   WBCs 9.7 on 1/12   Cont to monitor 15. ABLA  Hb 11.9 on 1/12, 11.8 on 1/15    Cont to monitor 16. Hypokalemia  K+ 4.4 on 1/15  Supplementing until 11/14  Labs ordered for Monday  Con to monitor 17. Sleep disturbance  Trazodone PRN  Improving  LOS (Days) 6 A FACE TO FACE EVALUATION WAS PERFORMED  Charlett Blake 08/28/2016 9:49 AM

## 2016-08-28 NOTE — Progress Notes (Signed)
Physical Therapy Session Note  Patient Details  Name: Yvonne Parrish MRN: [id number] Date of Birth: [birth year]  Today's Date: 08/28/2016 PT Individual Time: 1005-1100 PT Individual Time Calculation (min): 55 min    Short Term Goals: Week 1:  PT Short Term Goal 1 (Week 1): Patient will perform bed mobility consistently with mod assis t PT Short Term Goal 2 (Week 1): Patient will transfer with mod assist via Squat pivot.  PT Short Term Goal 3 (Week 1): Patient will perform WC moblitity x 18f with min assist PT Short Term Goal 4 (Week 1): Patient will initate stair training  PT Short Term Goal 5 (Week 1): Patient will ambulate 279fwith mod assist from PT and LRAD  Skilled Therapeutic Interventions/Progress Updates:    Session focus on NMR, balance, and overall functional mobility through transfers, gait, and w/c propulsion.  Pt propelled w/c to therapy gym with L hemi technique and overall supervision with min verbal cues to attend to R side.  Squat/pivot w/c<>therapy mat with max assist and cues for sequencing.  Blocked practice sit<>stand with multimodal cues for forward weight shift and upright posture.  Standing tolerance x3 trials to fatigue focus on reaching outside BOS and upright posture.  Pt returned to room at end of session and positioned upright in w/c with moist heat to neck for discomfort reported during session and call bell in reach/needs met.   Therapy Documentation Precautions:  Precautions Precautions: Fall Precaution Comments: chronic right foot drop Restrictions Weight Bearing Restrictions: No   See Function Navigator for Current Functional Status.   Therapy/Group: Individual Therapy  Derik Fults E Penven-Crew 08/28/2016, 10:30 AM

## 2016-08-28 NOTE — Progress Notes (Signed)
Occupational Therapy Session Note  Patient Details  Name: Yvonne Parrish MRN: [id number] Date of Birth: 1928/10/04  Today's Date: 08/28/2016 OT Individual Time: [id number] OT Individual Time Calculation (min): 55 min     Short Term Goals: Week 1:  OT Short Term Goal 1 (Week 1): Pt will transfer to toilet/ BSC with mod A  OT Short Term Goal 2 (Week 1): Pt will perform sit to stand with mod A in prep for LB dressing  OT Short Term Goal 3 (Week 1): Pt will thread LB clothing with min  A OT Short Term Goal 4 (Week 1): Pt will use right UE as gross assist with min A in functional task OT Short Term Goal 5 (Week 1): Pt will don shirt with min A   Skilled Therapeutic Interventions/Progress Updates:    Treatment session with focus on ADL retraining with functional transfers, sit > stand, and increased sequencing during bathing and dressing.  Completed all transfers (bed, w/c, toilet, shower) with mod-max assist this session with pt requiring lifting and lowering assistance.  Completed toileting with min/steady assist as well as bathing with steadying assistance when standing to wash buttocks.  Pt demonstrated good recall of hemi-dressing technique with UB dressing, requiring increased cues and assistance with LB dressing.  Grooming completed with setup assist.  Left seated upright in w/c with all needs in reach.  Therapy Documentation Precautions:  Precautions Precautions: Fall Precaution Comments: chronic right foot drop Restrictions Weight Bearing Restrictions: No General:   Vital Signs: Therapy Vitals Temp: 97.3 F (36.3 C) Temp Source: Oral Pulse Rate: 75 Resp: 18 BP: (!) 144/71 Patient Position (if appropriate): Sitting Oxygen Therapy SpO2: 97 % O2 Device: Not Delivered Pain:  Pt with no c/o pain  See Function Navigator for Current Functional Status.   Therapy/Group: Individual Therapy  Simonne Come 08/28/2016, 3:20 PM

## 2016-08-29 ENCOUNTER — Inpatient Hospital Stay (HOSPITAL_COMMUNITY): Payer: Medicare Other | Admitting: Occupational Therapy

## 2016-08-29 ENCOUNTER — Inpatient Hospital Stay (HOSPITAL_COMMUNITY): Payer: Medicare Other | Admitting: Physical Therapy

## 2016-08-29 ENCOUNTER — Ambulatory Visit (HOSPITAL_COMMUNITY): Payer: Medicare Other | Admitting: Physical Therapy

## 2016-08-29 ENCOUNTER — Inpatient Hospital Stay (HOSPITAL_COMMUNITY): Payer: Medicare Other

## 2016-08-29 ENCOUNTER — Inpatient Hospital Stay (HOSPITAL_COMMUNITY): Payer: Medicare Other | Admitting: Speech Pathology

## 2016-08-29 LAB — CBC
HCT: 35.7 % — ABNORMAL LOW (ref 36.0–46.0)
Hemoglobin: 11.7 g/dL — ABNORMAL LOW (ref 12.0–15.0)
MCH: 32.8 pg (ref 26.0–34.0)
MCHC: 32.8 g/dL (ref 30.0–36.0)
MCV: 100 fL (ref 78.0–100.0)
Platelets: 298 10*3/uL (ref 150–400)
RBC: 3.57 MIL/uL — ABNORMAL LOW (ref 3.87–5.11)
RDW: 12.5 % (ref 11.5–15.5)
WBC: 10.2 10*3/uL (ref 4.0–10.5)

## 2016-08-29 LAB — BASIC METABOLIC PANEL
Anion gap: 9 (ref 5–15)
BUN: 26 mg/dL — ABNORMAL HIGH (ref 6–20)
CO2: 24 mmol/L (ref 22–32)
Calcium: 9.8 mg/dL (ref 8.9–10.3)
Chloride: 103 mmol/L (ref 101–111)
Creatinine, Ser: 0.79 mg/dL (ref 0.44–1.00)
GFR calc Af Amer: >60 mL/min (ref >60)
GFR calc non Af Amer: >60 mL/min (ref >60)
Glucose, Bld: 122 mg/dL — ABNORMAL HIGH (ref 65–99)
Potassium: 4.3 mmol/L (ref 3.5–5.1)
Sodium: 136 mmol/L (ref 135–145)

## 2016-08-29 NOTE — Progress Notes (Signed)
Physical Therapy Session Note  Patient Details  Name: Yvonne Parrish MRN: [id number] Date of Birth: 29-Nov-1928  Today's Date: 08/29/2016 PT Individual Time: 1415-1500 PT Individual Time Calculation (min): 45 min   Short Term Goals: Week 1:  PT Short Term Goal 1 (Week 1): Patient will perform bed mobility consistently with mod assis t PT Short Term Goal 2 (Week 1): Patient will transfer with mod assist via Squat pivot.  PT Short Term Goal 3 (Week 1): Patient will perform WC moblitity x 177ft with min assist PT Short Term Goal 4 (Week 1): Patient will initate stair training  PT Short Term Goal 5 (Week 1): Patient will ambulate 39ft with mod assist from PT and LRAD  Skilled Therapeutic Interventions/Progress Updates:    Pt resting in w/c, no c/o pain and agreeable to therapy session.  Session focus on activity tolerance, functional mobility with transfers and ambulation, and RUE NMR for fine motor coordination.  Pt propelled w/c to day room with hemi-technique and supervision with more than a reasonable amount of time and min verbal cues for propulsion on carpeted surface.  Gait training x15' with RW and overall steady assist with pt able to advance and place her RLE today without outside assist.  RUE NMR for fine motor activity retrieving beads from theraputty with increased time.  Pt remained in dayroom for stroke support group at end of session.   Therapy Documentation Precautions:  Precautions Precautions: Fall Precaution Comments: chronic right foot drop Restrictions Weight Bearing Restrictions: No   See Function Navigator for Current Functional Status.   Therapy/Group: Individual Therapy  Earnest Conroy Penven-Crew 08/29/2016, 4:29 PM

## 2016-08-29 NOTE — Progress Notes (Signed)
[facility] PHYSICAL MEDICINE & REHABILITATION     PROGRESS NOTE  Subjective/Complaints:  No issues overnite, no breathing issues, notes no leg swelling ROS: Denies CP, SOB, N/V/D.  Objective: Vital Signs: Blood pressure (!) 125/49, pulse 79, temperature 97.5 F (36.4 C), temperature source Oral, resp. rate 18, height 5\' 4"  (1.626 m), weight 71 kg (156 lb 8.4 oz), SpO2 94 %. No results found.  Recent Labs  08/26/16 1201 08/29/16 0528  WBC 9.7 10.2  HGB 11.8* 11.7*  HCT 34.6* 35.7*  PLT 254 298    Recent Labs  08/26/16 1201 08/29/16 0528  NA 135 136  K 4.4 4.3  CL 98* 103  GLUCOSE 72 122*  BUN 19 26*  CREATININE 0.95 0.79  CALCIUM 9.5 9.8   CBG (last 3)   Recent Labs  08/27/16 1217  GLUCAP 109*    Wt Readings from Last 3 Encounters:  08/29/16 71 kg (156 lb 8.4 oz)  08/20/16 67.2 kg (148 lb 1.6 oz)    Physical Exam:  BP (!) 125/49 (BP Location: Left Arm)   Pulse 79   Temp 97.5 F (36.4 C) (Oral)   Resp 18   Ht 5\' 4"  (1.626 m)   Wt 71 kg (156 lb 8.4 oz)   SpO2 94%   BMI 26.87 kg/m  Constitutional: She appears well-developedand well-nourished.  HENT: Normocephalicand atraumatic.  Eyes: EOMI. No discharge.  Cardiovascular: RRR. No JVD.  Respiratory: Effort normal. Clear GI: Soft. Bowel sounds are normal.   Musculoskeletal: No tenderness. +Edema.   Neurological: She is alert.   Right facial weakness Speech with ataxic dysarthria.  Oriented x3.  Motor: RUE: 2/5 proximal to distal RLE: 1/5 proximal to distal (unchanged) Skin: Skin is warmand dry. Intact.   Assessment/Plan: 1. Functional deficits secondary to Left paramedian pontine infarct which require 3+ hours per day of interdisciplinary therapy in a comprehensive inpatient rehab setting. Physiatrist is providing close team supervision and 24 hour management of active medical problems listed below. Physiatrist and rehab team continue to assess barriers to discharge/monitor patient progress  toward functional and medical goals.  Function:  Bathing Bathing position   Position: Shower  Bathing parts Body parts bathed by patient: Right arm, Chest, Abdomen, Front perineal area, Right upper leg, Left upper leg, Buttocks Body parts bathed by helper: Left arm, Right lower leg, Left lower leg, Back  Bathing assist Assist Level:  (Mod assist)      Upper Body Dressing/Undressing Upper body dressing   What is the patient wearing?: Bra, Pull over shirt/dress Bra - Perfomed by patient: Thread/unthread right bra strap, Thread/unthread left bra strap Bra - Perfomed by helper: Hook/unhook bra (pull down sports bra) Pull over shirt/dress - Perfomed by patient: Thread/unthread right sleeve, Thread/unthread left sleeve, Pull shirt over trunk, Put head through opening Pull over shirt/dress - Perfomed by helper: Put head through opening        Upper body assist Assist Level: Touching or steadying assistance(Pt > 75%)      Lower Body Dressing/Undressing Lower body dressing   What is the patient wearing?: Pants     Pants- Performed by patient: Thread/unthread left pants leg Pants- Performed by helper: Pull pants up/down, Thread/unthread left pants leg, Thread/unthread right pants leg   Non-skid slipper socks- Performed by helper: Don/doff right sock, Don/doff left sock   Socks - Performed by helper: Don/doff right sock, Don/doff left sock   Shoes - Performed by helper: Don/doff right shoe, Don/doff left shoe, Fasten right, Fasten left  Lower body assist Assist for lower body dressing:  (total A)      Toileting Toileting   Toileting steps completed by patient: Performs perineal hygiene Toileting steps completed by helper: Adjust clothing after toileting, Performs perineal hygiene, Adjust clothing prior to toileting Toileting Assistive Devices: Grab bar or rail  Toileting assist Assist level: Two helpers   Transfers Chair/bed transfer   Chair/bed transfer method:  Squat pivot Chair/bed transfer assist level: Maximal assist (Pt 25 - 49%/lift and lower) Chair/bed transfer assistive device: Armrests Mechanical lift: Stedy   Locomotion Ambulation     Max distance: 20 Assist level: Moderate assist (Pt 50 - 74%)   Wheelchair   Type: Manual Max wheelchair distance: 60 Assist Level: Supervision or verbal cues  Cognition Comprehension Comprehension assist level: Follows basic conversation/direction with no assist  Expression Expression assist level: Expresses basic 90% of the time/requires cueing < 10% of the time.  Social Interaction Social Interaction assist level: Interacts appropriately with others with medication or extra time (anti-anxiety, antidepressant).  Problem Solving Problem solving assist level: Solves basic problems with no assist  Memory Memory assist level: Recognizes or recalls 50 - 74% of the time/requires cueing 25 - 49% of the time     Medical Problem List and Plan: 1. Right hemiparesis secondary to Left paramedian pontine infarct  Cont CIR PT, OT  Bracing ordered  Fluoxetine started 1/14, no SE 2. DVT Prophylaxis/Anticoagulation: Pharmaceutical: Lovenox 3. Chronic back pain/spasms/Pain Management: Continue flexeril prn 4. Mood: Stable. LCSW to follow for evaluation and support.  5. Neuropsych: This patient is notcapable of making decisions on herown behalf, controlled at present 6. Skin/Wound Care: Routine pressure relief measures. Maintain adequate nutrition and hydration status.  7. Fluids/Electrolytes/Nutrition: Monitor I/O  Diet advanced to regular.  8. HTN: Monitor BP bid  Continue lisinopril   Norvasc 2.5 started 1/14, increase to 5mg , variable cont to monitor, good control Vitals:   08/28/16 1424 08/29/16 0434  BP: (!) 144/71 (!) 125/49  Pulse: 75 79  Resp: 18 18  Temp: 97.3 F (36.3 C) 97.5 F (36.4 C)   9. 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  10. H/o asthma/Interstitial lung  disease: Monitor respiratory status with increase in activity. albuterol MDI prn 11 Chronic systolic CHF: On ASA, metoprolol and zocor. Monitor for signs of overload. Heart healthy/Low salt restrictions. Rales at lung bases but no JVD or LE edema  Filed Weights   08/27/16 0547 08/28/16 0450 08/29/16 0434  Weight: 70 kg (154 lb 5.2 oz) 70.2 kg (154 lb 12.2 oz) 71 kg (156 lb 8.4 oz)   12. Chronic constipation: Resumed miralax.  13. GERD: Wth chronic nausea--continue pepcid for now. Small portions at meals.   May need PPI.  14. Leukocytosis resolved   WBCs 9.7 on 1/12   Cont to monitor 15. ABLA  Hb 11.9 on 1/12, 11.8 on 1/15    Cont to monitor 16. Hypokalemia- resolved off supplements  K+ 4.4 on 1/15, 4.3 on 1/18   Con to monitor 17. Sleep disturbance  Trazodone PRN  Improved  LOS (Days) 7 A FACE TO FACE EVALUATION WAS PERFORMED  Charlett Blake 08/29/2016 9:33 AM

## 2016-08-29 NOTE — Progress Notes (Signed)
Speech Language Pathology Daily Session Note  Patient Details  Name: Yvonne Parrish MRN: [id number] Date of Birth: 18-May-1929  Today's Date: 08/29/2016 SLP Individual Time: 0900-1000 SLP Individual Time Calculation (min): 60 min  Short Term Goals: Week 1: SLP Short Term Goal 1 (Week 1): Pt will utilize speech intelligibility strategies at the sentence level with Min A verbal cues to achieve >90% intelligibility.  SLP Short Term Goal 2 (Week 1): Pt will demonstrate self-monitoring and correction with speech intelligibility at the sentence level with Min A verbal cues.  SLP Short Term Goal 3 (Week 1): Pt will consume current diet without overt s/s of aspiration with supervision cues for use of swallowing strategies.  SLP Short Term Goal 4 (Week 1): Pt will utilize word-finding strategies during structured language activies with Mod A verbal cues.   Skilled Therapeutic Interventions: Skilled treatment session focused on dysphagia and cognition goals. SLP facilitated session by providing supervision cues when consuming thin liquids. Pt consumed without overt s/s of aspiration. Pt required Min A cues to utilize word-finding strategies during structured language activiies to achieve 100% communication at the multiple sentence level. Pt required Min A verbal cues to utilize speech intelligibility strategies at the simple conversation level to achieve >90% intelligibility. Pt was returned to room, left in wheelchair with daughter present. Continue per current plan of care.      Function:  Eating Eating   Modified Consistency Diet: No Eating Assist Level: More than reasonable amount of time           Cognition Comprehension Comprehension assist level: Follows basic conversation/direction with no assist  Expression   Expression assist level: Expresses basic 90% of the time/requires cueing < 10% of the time.  Social Interaction Social Interaction assist level: Interacts appropriately with others  with medication or extra time (anti-anxiety, antidepressant).  Problem Solving Problem solving assist level: Solves basic problems with no assist  Memory Memory assist level: Recognizes or recalls 75 - 89% of the time/requires cueing 10 - 24% of the time    Pain Pain Assessment Pain Score: 3  (post therapy)  Therapy/Group: Individual Therapy  Cattie Tineo 08/29/2016, 12:00 PM

## 2016-08-29 NOTE — Progress Notes (Signed)
Physical Therapy Note  Patient Details  Name: Yvonne Parrish MRN: [id number] Date of Birth: 10-Dec-1928 Today's Date: 08/29/2016  [id number], 45 min individual tx Pain: none reported  tx focused on bed mobility, neuro re-ed in supine for 10 x 1 each bil lower trunk rotation, R heel slides, L ankle pumps, R ankle PF.  Pt transferred to ultra hemi ht w/c for propulsion.  Pt left resting in w/c all needs within reach.  See function navigator for current status.  Norman Bier 08/29/2016, 7:49 AM

## 2016-08-29 NOTE — Progress Notes (Signed)
Occupational Therapy Session Note  Patient Details  Name: Yvonne Parrish MRN: [id number] Date of Birth: 12-11-1928  Today's Date: 08/29/2016 OT Individual Time: [id number] OT Individual Time Calculation (min): 46 min    Short Term Goals: Week 1:  OT Short Term Goal 1 (Week 1): Pt will transfer to toilet/ BSC with mod A  OT Short Term Goal 2 (Week 1): Pt will perform sit to stand with mod A in prep for LB dressing  OT Short Term Goal 3 (Week 1): Pt will thread LB clothing with min  A OT Short Term Goal 4 (Week 1): Pt will use right UE as gross assist with min A in functional task OT Short Term Goal 5 (Week 1): Pt will don shirt with min A   Skilled Therapeutic Interventions/Progress Updates:    Treatment session with focus on ADL retraining with education on hemi-techniques and use of AE to increase independence.  Pt declined bathing at this time but willing to work on dressing tasks.  Pt donned bra this session by fastening bra on lap with increased use of RUE to fasten bra without tension, pt still required assistance with pulling bra down in back.  Donned pants with pt starting to thread RLE, getting foot in, then threading entire Lt leg, and then returned to finish Rt leg with good success after multiple attempts to don Rt leg first.  Required max assist sit > stand with pt having one hand on RW and other on w/c arm rest.  Decreased lifting assistance required when pt pushing up from bilateral arm rests.  Pt able to don shoes with Rt leg crossed over Lt knee and then donning Lt shoe while on floor, unable to fasten shoes.  Therapist issued shoe buttons with pt able to return demonstration of fastening shoes with use of shoe button.  Pt's daughter present and observing.  Therapy Documentation Precautions:  Precautions Precautions: Fall Precaution Comments: chronic right foot drop Restrictions Weight Bearing Restrictions: No Pain: Pain Assessment Pain Score: 3  (post therapy)  See  Function Navigator for Current Functional Status.   Therapy/Group: Individual Therapy  Simonne Come 08/29/2016, 12:21 PM

## 2016-08-30 ENCOUNTER — Inpatient Hospital Stay (HOSPITAL_COMMUNITY): Payer: Medicare Other | Admitting: Speech Pathology

## 2016-08-30 ENCOUNTER — Inpatient Hospital Stay (HOSPITAL_COMMUNITY): Payer: Medicare Other | Admitting: Physical Therapy

## 2016-08-30 ENCOUNTER — Inpatient Hospital Stay (HOSPITAL_COMMUNITY): Payer: Medicare Other | Admitting: Occupational Therapy

## 2016-08-30 NOTE — Progress Notes (Signed)
Physical Therapy Session Note  Patient Details  Name: OBIE SCRAGG MRN: [id number] Date of Birth: 01-19-1929  Today's Date: 08/30/2016 PT Individual Time: 0830-0900 PT Individual Time Calculation (min): 30 min   Short Term Goals: Week 1:  PT Short Term Goal 1 (Week 1): Patient will perform bed mobility consistently with mod assis t PT Short Term Goal 2 (Week 1): Patient will transfer with mod assist via Squat pivot.  PT Short Term Goal 3 (Week 1): Patient will perform WC moblitity x 151ft with min assist PT Short Term Goal 4 (Week 1): Patient will initate stair training  PT Short Term Goal 5 (Week 1): Patient will ambulate 75ft with mod assist from PT and LRAD  Skilled Therapeutic Interventions/Progress Updates: Pt received seated in w/c, denies pain and agreeable to treatment. MinA sit <>stand from hemi-height w/c with RUE on RW in hand splint. Gait x20' with RW and min guard; significant RLE foot drag and increased energy expenditure. Applied ace wrap dorsiflexion assist to RLE to improve foot clearance and reduce energy expenditure. Gait x108' with RW and min guard/close S with significantly improved foot clearance, gait speed, step symmetry. Sit <>stand x5 reps from w/c with minA overall, with reducing therapist percent assist with each trial. Remained seated in w/c at end of sessio, daughter present and all needs in reach.      Therapy Documentation Precautions:  Precautions Precautions: Fall Precaution Comments: chronic right foot drop Restrictions Weight Bearing Restrictions: No   See Function Navigator for Current Functional Status.   Therapy/Group: Individual Therapy  Luberta Mutter 08/30/2016, 9:52 AM

## 2016-08-30 NOTE — Progress Notes (Signed)
Physical Therapy Session Note  Patient Details  Name: Yvonne Parrish MRN: [id number] Date of Birth: 19-Apr-1929  Today's Date: 08/30/2016 PT Individual Time: 1445-1545 PT Individual Time Calculation (min): 60 min   Short Term Goals: Week 1:  PT Short Term Goal 1 (Week 1): Patient will perform bed mobility consistently with mod assis t PT Short Term Goal 2 (Week 1): Patient will transfer with mod assist via Squat pivot.  PT Short Term Goal 3 (Week 1): Patient will perform WC moblitity x 154f with min assist PT Short Term Goal 4 (Week 1): Patient will initate stair training  PT Short Term Goal 5 (Week 1): Patient will ambulate 245fwith mod assist from PT and LRAD  Skilled Therapeutic Interventions/Progress Updates:  No c/o pain.  Session focus on gait training, transfers, NMR for weight shifting and weight bearing through RLE and functional use of RUE.    Pt propelled w/c to therapy gym with supervision using BUEs and LLE for mobility and overall activity tolerance.  Gait training x91' with RW and R foot up brace with close supervision and min verbal cues for increasing R step length and walker positioning.  Blocked practice for sit<>stand from 18" height with BUE support from mat with overall steady assist and mutlimodal cues for forward weight shift and bringing trunk upright.  NMR for standing tolerance and balance with LLE on 2" step focus on reaching task with RUE outside BOS to R to increase weight shift and force weight bearing on RLE.  Pt returned to room at end of session and positioned upright in w/c with call bell in reach and needs met.      Therapy Documentation Precautions:  Precautions Precautions: Fall Precaution Comments: chronic right foot drop Restrictions Weight Bearing Restrictions: No   See Function Navigator for Current Functional Status.   Therapy/Group: Individual Therapy  CaEarnest Conroyenven-Crew 08/30/2016, 3:58 PM

## 2016-08-30 NOTE — Progress Notes (Signed)
PHYSICAL MEDICINE & REHABILITATION     PROGRESS NOTE  Subjective/Complaints:  No issues overnite  No othec/o today still weak on Right side ROS: Denies CP, SOB, N/V/D.  Objective: Vital Signs: Blood pressure (!) 152/53, pulse 71, temperature 98.4 F (36.9 C), temperature source Oral, resp. rate 16, height 5\' 4"  (1.626 m), weight 66.5 kg (146 lb 9.7 oz), SpO2 97 %. No results found.  Recent Labs  08/29/16 0528  WBC 10.2  HGB 11.7*  HCT 35.7*  PLT 298    Recent Labs  08/29/16 0528  NA 136  K 4.3  CL 103  GLUCOSE 122*  BUN 26*  CREATININE 0.79  CALCIUM 9.8   CBG (last 3)   Recent Labs  08/27/16 1217  GLUCAP 109*    Wt Readings from Last 3 Encounters:  08/30/16 66.5 kg (146 lb 9.7 oz)  08/20/16 67.2 kg (148 lb 1.6 oz)    Physical Exam:  BP (!) 152/53 (BP Location: Right Arm)   Pulse 71   Temp 98.4 F (36.9 C) (Oral)   Resp 16   Ht 5\' 4"  (1.626 m)   Wt 66.5 kg (146 lb 9.7 oz)   SpO2 97%   BMI 25.16 kg/m  Constitutional: She appears well-developedand well-nourished.  HENT: Normocephalicand atraumatic.  Eyes: EOMI. No discharge.  Cardiovascular: RRR. No JVD.  Respiratory: Effort normal. Clear GI: Soft. Bowel sounds are normal.   Musculoskeletal: No tenderness. +Edema.   Neurological: She is alert.   Right facial weakness Speech with ataxic dysarthria.  Oriented x3.  Motor: RUE: 2/5 proximal to distal RLE: 1/5 proximal to distal (unchanged) Skin: Skin is warmand dry. Intact.   Assessment/Plan: 1. Functional deficits secondary to Left paramedian pontine infarct which require 3+ hours per day of interdisciplinary therapy in a comprehensive inpatient rehab setting. Physiatrist is providing close team supervision and 24 hour management of active medical problems listed below. Physiatrist and rehab team continue to assess barriers to discharge/monitor patient progress toward functional and medical goals.  Function:  Bathing Bathing  position   Position: Shower  Bathing parts Body parts bathed by patient: Right arm, Chest, Abdomen, Front perineal area, Right upper leg, Left upper leg, Buttocks Body parts bathed by helper: Left arm, Right lower leg, Left lower leg, Back  Bathing assist Assist Level:  (Mod assist)      Upper Body Dressing/Undressing Upper body dressing   What is the patient wearing?: Bra, Pull over shirt/dress Bra - Perfomed by patient: Thread/unthread right bra strap, Thread/unthread left bra strap Bra - Perfomed by helper: Hook/unhook bra (pull down sports bra) Pull over shirt/dress - Perfomed by patient: Thread/unthread right sleeve, Thread/unthread left sleeve, Pull shirt over trunk, Put head through opening Pull over shirt/dress - Perfomed by helper: Put head through opening        Upper body assist Assist Level: Touching or steadying assistance(Pt > 75%)      Lower Body Dressing/Undressing Lower body dressing   What is the patient wearing?: Pants, Shoes     Pants- Performed by patient: Thread/unthread right pants leg, Thread/unthread left pants leg Pants- Performed by helper: Pull pants up/down   Non-skid slipper socks- Performed by helper: Don/doff right sock, Don/doff left sock   Socks - Performed by helper: Don/doff right sock, Don/doff left sock Shoes - Performed by patient: Don/doff right shoe, Don/doff left shoe Shoes - Performed by helper: Fasten right, Fasten left          Lower body assist Assist  for lower body dressing:  (mod assist)      Toileting Toileting   Toileting steps completed by patient: Performs perineal hygiene (pt wearing down) Toileting steps completed by helper: Adjust clothing prior to toileting, Adjust clothing after toileting Toileting Assistive Devices: Grab bar or rail  Toileting assist Assist level: Two helpers (steady)   Transfers Chair/bed transfer   Chair/bed transfer method: Ambulatory Chair/bed transfer assist level: Touching or steadying  assistance (Pt > 75%) Chair/bed transfer assistive device: Armrests, Walker Mechanical lift: Ecologist     Max distance: 15 Assist level: Touching or steadying assistance (Pt > 75%)   Wheelchair   Type: Manual Max wheelchair distance: 150 Assist Level: Supervision or verbal cues  Cognition Comprehension Comprehension assist level: Follows basic conversation/direction with no assist  Expression Expression assist level: Expresses basic 90% of the time/requires cueing < 10% of the time.  Social Interaction Social Interaction assist level: Interacts appropriately with others with medication or extra time (anti-anxiety, antidepressant).  Problem Solving Problem solving assist level: Solves basic problems with no assist  Memory Memory assist level: Recognizes or recalls 75 - 89% of the time/requires cueing 10 - 24% of the time     Medical Problem List and Plan: 1. Right hemiparesis secondary to Left paramedian pontine infarct  Cont CIR PT, OT  Bracing ordered  Fluoxetine started 1/14 2. DVT Prophylaxis/Anticoagulation: Pharmaceutical: Lovenox 3. Chronic back pain/spasms/Pain Management: Continue flexeril prn 4. Mood: Stable. LCSW to follow for evaluation and support.  5. Neuropsych: This patient is notcapable of making decisions on herown behalf, controlled at present 6. Skin/Wound Care: Routine pressure relief measures. Maintain adequate nutrition and hydration status.  7. Fluids/Electrolytes/Nutrition: Monitor I/O  Diet advanced to regular.  8. HTN: Monitor BP bid  Continue lisinopril   Norvasc 2.5 started 1/14, increase to 5mg , variable cont to monitor, check ortho vitals Vitals:   08/29/16 1414 08/30/16 0430  BP: (!) 121/43 (!) 152/53  Pulse: 69 71  Resp: 18 16  Temp: 97.5 F (36.4 C) 98.4 F (36.9 C)   9. 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  10. H/o asthma/Interstitial lung disease: Monitor respiratory status with increase  in activity. albuterol MDI prn 11 Chronic systolic CHF: On ASA, metoprolol and zocor. Monitor for signs of overload. Heart healthy/Low salt restrictions. Discontinued IVF to avoid fluid overload.  Filed Weights   08/28/16 0450 08/29/16 0434 08/30/16 0500  Weight: 70.2 kg (154 lb 12.2 oz) 71 kg (156 lb 8.4 oz) 66.5 kg (146 lb 9.7 oz)   12. Chronic constipation: Resumed miralax.  13. GERD: Wth chronic nausea--continue pepcid for now. Small portions at meals.   May need PPI.  14. Leukocytosis resolved   WBCs 9.7 on 1/12   Cont to monitor 15. ABLA  Hb 11.9 on 1/12, 11.8 on 1/15    Cont to monitor 16. Hypokalemia  K+ 4.4 on 1/15  Supplementing until 11/14  Labs ordered for Monday  Con to monitor 17. Sleep disturbance  Trazodone PRN  Improving  LOS (Days) 8 A FACE TO FACE EVALUATION WAS PERFORMED  Charlett Blake 08/30/2016 7:54 AM

## 2016-08-30 NOTE — Progress Notes (Signed)
Speech Language Pathology Weekly Progress and Session Note  Patient Details  Name: Yvonne Parrish MRN: [id number] Date of Birth: 08-12-1929  Beginning of progress report period: August 23, 2016  End of progress report period: August 30, 2016   Today's Date: 08/30/2016 SLP Individual Time: 1348-1430 SLP Individual Time Calculation (min): 42 min  Short Term Goals: Week 1: SLP Short Term Goal 1 (Week 1): Pt will utilize speech intelligibility strategies at the sentence level with Min A verbal cues to achieve >90% intelligibility.  SLP Short Term Goal 1 - Progress (Week 1): Met SLP Short Term Goal 2 (Week 1): Pt will demonstrate self-monitoring and correction with speech intelligibility at the sentence level with Min A verbal cues.  SLP Short Term Goal 2 - Progress (Week 1): Met SLP Short Term Goal 3 (Week 1): Pt will consume current diet without overt s/s of aspiration with supervision cues for use of swallowing strategies.  SLP Short Term Goal 3 - Progress (Week 1): Met SLP Short Term Goal 4 (Week 1): Pt will utilize word-finding strategies during structured language activies with Mod A verbal cues.  SLP Short Term Goal 4 - Progress (Week 1): Met    New Short Term Goals: Week 2: SLP Short Term Goal 1 (Week 2): Pt will utilize speech intelligibility strategies at the conversational level with mod I in a distracting environment to achieve >90% intelligibility.  SLP Short Term Goal 2 (Week 2): Pt will demonstrate self-monitoring and correction of speech intelligibility at the conversational level with mod I.  SLP Short Term Goal 3 (Week 2): Pt will utilize word-finding strategies during structured language activies with mod I.  SLP Short Term Goal 4 (Week 2): Pt will consume meds whole with liquids with mod I use of swallowing precautions x1 pior to advancement.    Weekly Progress Updates: Pt has made functional gains this reporting period and has met 4 out of 4 short term goals.  Pt is  currently at most a supervision assist  for intelligibility and word finding at the conversational level.  She is consuming regular textures and thin liquids with mod I use of swallowing precautions.  Pt would continue to benefit from skilled ST while inpatient in order to maximize functional independence and reduce burden of care prior to discharge.       Intensity: Minumum of 1-2 x/day, 30 to 90 minutes Frequency: 3 to 5 out of 7 days Duration/Length of Stay: 3 weeks Treatment/Interventions: Dysphagia/aspiration precaution training;Speech/Language facilitation;Patient/family education;Internal/external aids   Daily Session  Skilled Therapeutic Interventions: Pt was seen for skilled ST targeting communication goals.  Pt was bright, alert, and pleasantly interactive for duration of therapy session.  Speech intelligibility and word finding were markedly improved today in comparison to previous therapy sessions and pt was distant supervision for use of compensatory strategies at the conversational level in a mildly distracting environment.   Pt was returned to room and left in wheelchair with call bell within reach and daughter at bedside.  Goals updated on this date to reflect current progress and plan of care.       Function:   Eating Eating                 Cognition Comprehension Comprehension assist level: Follows basic conversation/direction with no assist  Expression   Expression assist level: Expresses complex 90% of the time/cues < 10% of the time  Social Interaction Social Interaction assist level: Interacts appropriately with others with medication or extra  time (anti-anxiety, antidepressant).  Problem Solving Problem solving assist level: Solves basic problems with no assist  Memory Memory assist level: Recognizes or recalls 90% of the time/requires cueing < 10% of the time   General    Pain Pain Assessment Pain Assessment: No/denies pain  Therapy/Group: Individual  Therapy  Zebulun Deman, Selinda Orion 08/30/2016, 3:40 PM

## 2016-08-30 NOTE — Progress Notes (Signed)
Occupational Therapy Weekly Progress Note  Patient Details  Name: Yvonne Parrish MRN: [id number] Date of Birth: Dec 21, 1928  Beginning of progress report period: August 23, 2016 End of progress report period: August 30, 2016  Today's Date: 08/30/2016 OT Individual Time: 9417-4081 OT Individual Time Calculation (min): 56 min    Patient has met 4 of 5 short term goals.  Pt is making steady progress towards goals.  Pt currently requires max assist transfers and sit > stand due to requiring lifting and lowering assistance.  Pt is demonstrating good awareness of deficits and ability to recall education on hemi-technique to increase success with bathing and dressing tasks.  Pt is developing increased functional use of RUE with improved shoulder flexion and finger movements.  Pt's family is present and supportive throughout therapy sessions.  Patient continues to demonstrate the following deficits: secondary to muscle weakness, decreased cardiorespiratoy endurance, impaired timing and sequencing, unbalanced muscle activation and decreased coordination, chronic foot drop on the right, decreased attention, decreased awareness, decreased problem solving, decreased memory and delayed processing and decreased standing balance, decreased postural control, hemiplegia and decreased balance strategies and therefore will continue to benefit from skilled OT intervention to enhance overall performance with BADL and Reduce care partner burden.  Patient progressing toward long term goals..  Continue plan of care.  OT Short Term Goals Week 1:  OT Short Term Goal 1 (Week 1): Pt will transfer to toilet/ BSC with mod A  OT Short Term Goal 1 - Progress (Week 1): Progressing toward goal OT Short Term Goal 2 (Week 1): Pt will perform sit to stand with mod A in prep for LB dressing  OT Short Term Goal 2 - Progress (Week 1): Met OT Short Term Goal 3 (Week 1): Pt will thread LB clothing with min  A OT Short Term Goal 3 -  Progress (Week 1): Met OT Short Term Goal 4 (Week 1): Pt will use right UE as gross assist with min A in functional task OT Short Term Goal 4 - Progress (Week 1): Met OT Short Term Goal 5 (Week 1): Pt will don shirt with min A  OT Short Term Goal 5 - Progress (Week 1): Met Week 2:  OT Short Term Goal 1 (Week 2): Pt will transfer to toilet/ BSC with mod A  OT Short Term Goal 2 (Week 2): Pt will don pants at sit > stand level with min assist OT Short Term Goal 3 (Week 2): Pt will complete UB dressing with supervision OT Short Term Goal 4 (Week 2): Pt will utilize RUE at diminished level during functional task of self-feeding or dressing  Skilled Therapeutic Interventions/Progress Updates:    Treatment session with focus on sit > stand, standing tolerance, and functional use of RUE during self-care tasks of bathing and dressing.  Pt deferred shower this session as RN had just applied muscle rub to her knees.  Completed UB bathing and dressing at sink with pt utilizing RUE as gross assist during bathing with min cues for encouragement.  Engaged in sit > stand with pt progressing from mod- min assist requiring light lifting to just steady assist if pt able to achieve enough anterior weight shift.  Donned pants and shoes with increased time with recall of hemi-dressing technique and use of shoe buttons when doffing and donning shoes.  Engaged in [place] with focus on fine motor coordination with picking up small animal figurines and setting them upright, then reaching across midline to place in  cup.  Pt demonstrating shoulder ROM to approx 90 degrees and improved coordination to pick up and manipulate small items.    Therapy Documentation Precautions:  Precautions Precautions: Fall Precaution Comments: chronic right foot drop Restrictions Weight Bearing Restrictions: No General:   Vital Signs: Therapy Vitals Temp: 98.4 F (36.9 C) Temp Source: Oral Pulse Rate: 71 Resp: 16 BP: (!)  152/53 Patient Position (if appropriate): Lying Oxygen Therapy SpO2: 97 % O2 Device: Not Delivered Pain:  Pt with no c/o pain  See Function Navigator for Current Functional Status.   Therapy/Group: Individual Therapy  Simonne Come 08/30/2016, 7:56 AM

## 2016-08-30 NOTE — Progress Notes (Signed)
Physical Therapy Weekly Progress Note  Patient Details  Name: Yvonne Parrish MRN: [id number] Date of Birth: [birth year]  Beginning of progress report period: August 23, 2016 End of progress report period: August 30, 2016   Patient has met 4 of 5 short term goals.  Pt has made excellent progress towards goals this week and is currently performing at an overall min assist level with occasional need for mod assist when transferring from a low surface without armrests.    Patient continues to demonstrate the following deficits muscle weakness, decreased cardiorespiratoy endurance, unbalanced muscle activation, ataxia and decreased coordination and decreased standing balance, decreased postural control and decreased balance strategies and therefore will continue to benefit from skilled PT intervention to increase functional independence with mobility.  Patient progressing toward long term goals..  Plan of care revisions: upgraded to supervision level overall.  PT Short Term Goals Week 1:  PT Short Term Goal 1 (Week 1): Patient will perform bed mobility consistently with mod assis t PT Short Term Goal 1 - Progress (Week 1): Progressing toward goal PT Short Term Goal 2 (Week 1): Patient will transfer with mod assist via Squat pivot.  PT Short Term Goal 2 - Progress (Week 1): Met PT Short Term Goal 3 (Week 1): Patient will perform WC moblitity x 177f with min assist PT Short Term Goal 3 - Progress (Week 1): Met PT Short Term Goal 4 (Week 1): Patient will initate stair training  PT Short Term Goal 4 - Progress (Week 1): Met PT Short Term Goal 5 (Week 1): Patient will ambulate 264fwith mod assist from PT and LRAD PT Short Term Goal 5 - Progress (Week 1): Met Week 2:  PT Short Term Goal 1 (Week 2): =LTGs  Therapy Documentation Precautions:  Precautions Precautions: Fall Precaution Comments: chronic right foot drop Restrictions Weight Bearing Restrictions: No   See Function Navigator for  Current Functional Status.  Therapy/Group: Individual Therapy  CaEarnest Conroyenven-Crew 08/30/2016, 4:50 PM

## 2016-08-31 ENCOUNTER — Inpatient Hospital Stay (HOSPITAL_COMMUNITY): Payer: Medicare Other | Admitting: Physical Therapy

## 2016-08-31 NOTE — Progress Notes (Signed)
[facility]IC[facility] & REHABILITATION     PROGRESS NOTE  Subjective/Complaints:  Had a good night. No complaints. Daughter in the room.  ROS: pt denies nausea, vomiting, diarrhea, cough, shortness of breath or chest pain  Objective: Vital Signs: Blood pressure (!) 139/45, pulse 75, temperature 98 F (36.7 C), temperature source Oral, resp. rate 18, height 5\' 4"  (1.626 m), weight 66 kg (145 lb 8.1 oz), SpO2 98 %. No results found.  Recent Labs  08/29/16 0528  WBC 10.2  HGB 11.7*  HCT 35.7*  PLT 298    Recent Labs  08/29/16 0528  NA 136  K 4.3  CL 103  GLUCOSE 122*  BUN 26*  CREATININE 0.79  CALCIUM 9.8   CBG (last 3)  No results for input(s): GLUCAP in the last 72 hours.  Wt Readings from Last 3 Encounters:  08/31/16 66 kg (145 lb 8.1 oz)  08/20/16 67.2 kg (148 lb 1.6 oz)    Physical Exam:  BP (!) 139/45   Pulse 75   Temp 98 F (36.7 C) (Oral)   Resp 18   Ht 5\' 4"  (1.626 m)   Wt 66 kg (145 lb 8.1 oz)   SpO2 98%   BMI 24.98 kg/m  Constitutional: She appears well-developedand well-nourished.  HENT: Normocephalicand atraumatic.  Eyes: EOMI. No discharge.  Cardiovascular: RRR. No JVD.  Respiratory: Effort normal. Clear GI: Soft. Bowel sounds are normal.   Musculoskeletal: No tenderness. +Edema.   Neurological: She is alert.   Right facial weakness Speech with ataxic dysarthria.  Oriented x3.  Motor: RUE: 2-3/5 proximal to distal,. RLE 2+ to 3/5 RLE: 1/5 proximal to distal (unchanged) Skin: Skin is warmand dry. Intact.   Assessment/Plan: 1. Functional deficits secondary to Left paramedian pontine infarct which require 3+ hours per day of interdisciplinary therapy in a comprehensive inpatient rehab setting. Physiatrist is providing close team supervision and 24 hour management of active medical problems listed below. Physiatrist and rehab team continue to assess barriers to discharge/monitor patient progress toward functional and medical  goals.  Function:  Bathing Bathing position   Position: Wheelchair/chair at sink  Bathing parts Body parts bathed by patient: Right arm, Chest, Abdomen, Front perineal area, Buttocks, Left arm (deferred LB due to RN just applying muscle rub to knees) Body parts bathed by helper: Left arm, Right lower leg, Left lower leg, Back  Bathing assist Assist Level: Touching or steadying assistance(Pt > 75%)      Upper Body Dressing/Undressing Upper body dressing   What is the patient wearing?: Pull over shirt/dress Bra - Perfomed by patient: Thread/unthread right bra strap, Thread/unthread left bra strap Bra - Perfomed by helper: Hook/unhook bra (pull down sports bra) Pull over shirt/dress - Perfomed by patient: Thread/unthread right sleeve, Thread/unthread left sleeve, Pull shirt over trunk, Put head through opening Pull over shirt/dress - Perfomed by helper: Put head through opening        Upper body assist Assist Level: Set up      Lower Body Dressing/Undressing Lower body dressing   What is the patient wearing?: Pants, Shoes     Pants- Performed by patient: Thread/unthread right pants leg, Thread/unthread left pants leg Pants- Performed by helper: Pull pants up/down   Non-skid slipper socks- Performed by helper: Don/doff right sock, Don/doff left sock   Socks - Performed by helper: Don/doff right sock, Don/doff left sock Shoes - Performed by patient: Don/doff right shoe, Don/doff left shoe, Fasten right, Fasten left Shoes - Performed by helper:  Fasten right, Fasten left          Lower body assist Assist for lower body dressing: Touching or steadying assistance (Pt > 75%)      Toileting Toileting   Toileting steps completed by patient: Performs perineal hygiene Toileting steps completed by helper: Adjust clothing prior to toileting, Adjust clothing after toileting Toileting Assistive Devices: Grab bar or rail  Toileting assist Assist level: Two helpers    Transfers Chair/bed transfer   Chair/bed transfer method: Stand pivot Chair/bed transfer assist level: Touching or steadying assistance (Pt > 75%) Chair/bed transfer assistive device: Armrests, Environmental manager lift: Ecologist     Max distance: 91 Assist level: Supervision or verbal cues   Wheelchair   Type: Manual Max wheelchair distance: 150 Assist Level: Supervision or verbal cues  Cognition Comprehension Comprehension assist level: Follows basic conversation/direction with no assist  Expression Expression assist level: Expresses complex 90% of the time/cues < 10% of the time  Social Interaction Social Interaction assist level: Interacts appropriately with others with medication or extra time (anti-anxiety, antidepressant).  Problem Solving Problem solving assist level: Solves basic problems with no assist  Memory Memory assist level: Recognizes or recalls 90% of the time/requires cueing < 10% of the time     Medical Problem List and Plan: 1. Right hemiparesis secondary to Left paramedian pontine infarct  Cont CIR PT, OT  Bracing ordered  Fluoxetine started 1/14 2. DVT Prophylaxis/Anticoagulation: Pharmaceutical: Lovenox 3. Chronic back pain/spasms/Pain Management: Continue flexeril prn 4. Mood: Stable. LCSW to follow for evaluation and support.  5. Neuropsych: This patient is notcapable of making decisions on herown behalf, controlled at present 6. Skin/Wound Care: Routine pressure relief measures. Maintain adequate nutrition and hydration status.  7. Fluids/Electrolytes/Nutrition: Monitor I/O  Diet advanced to regular.  8. HTN: Monitor BP bid  Continue lisinopril   Norvasc 2.5 started 1/14, increased to 5mg  with some improvement Vitals:   08/31/16 0621 08/31/16 0808  BP: (!) 158/60 (!) 139/45  Pulse: 80 75  Resp: 18   Temp: 98 F (36.7 C)    9. 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  10. H/o asthma/Interstitial  lung disease: Monitor respiratory status with increase in activity. albuterol MDI prn 11 Chronic systolic CHF: On ASA, metoprolol and zocor. Monitor for signs of overload. Heart healthy/Low salt restrictions.     -off IVF now-  Filed Weights   08/29/16 0434 08/30/16 0500 08/31/16 0621  Weight: 71 kg (156 lb 8.4 oz) 66.5 kg (146 lb 9.7 oz) 66 kg (145 lb 8.1 oz)   12. Chronic constipation: Resumed miralax.  13. GERD: Wth chronic nausea--continue pepcid for now. Small portions at meals.   May need PPI.  14. Leukocytosis resolved   WBCs 9.7 on 1/12   Cont to monitor 15. ABLA  Hb 11.9 on 1/12, 11.8 on 1/15    Cont to monitor 16. Hypokalemia  K+ 4.4 on 1/15  Supplementing until 11/14 17. Sleep disturbance  Trazodone PRN  Improving  LOS (Days) 9 A FACE TO FACE EVALUATION WAS PERFORMED  Delayni Streed T 08/31/2016 9:46 AM

## 2016-08-31 NOTE — Progress Notes (Signed)
Physical Therapy Session Note  Patient Details  Name: Yvonne Parrish MRN: [id number] Date of Birth: 12-23-1928  Today's Date: 08/31/2016 PT Individual Time: 3912-2583 PT Individual Time Calculation (min): 75 min   Short Term Goals: Week 2:  PT Short Term Goal 1 (Week 2): =LTGs  Skilled Therapeutic Interventions/Progress Updates:    No c/o pain.   Session focus on transfers, dynamic sitting/standing balance, NMR for RLE swing through/heel strike with gait, and overall activity tolerance.    Pt requiring initially mod assist for sit<>stand from ultra hemi-height w/c progress to min assist throughout session.  Pt performing toileting, dressing, and self care tasks with sit<>stand and min assist to supervision for dynamic sitting and standing balance.    NMR via gait training while kicking yoga block with RLE focus on hip extension, knee flexion/extension, heel strike, and neutral rotation.  Min assist overall for gait with LUE support on rail and mod multimodal cues for LE control/placement.    Gait training x100' with RW and R ace wrap for dorsiflexion assist with supervision and multimodal cues for equal step length, heel strike, and posture.    Pt returned to room at end of session, positioned upright in w/c with call bell in reach and needs met.   Therapy Documentation Precautions:  Precautions Precautions: Fall Precaution Comments: chronic right foot drop Restrictions Weight Bearing Restrictions: No   See Function Navigator for Current Functional Status.   Therapy/Group: Individual Therapy  Earnest Conroy Penven-Crew 08/31/2016, 9:29 AM

## 2016-09-01 ENCOUNTER — Encounter (HOSPITAL_COMMUNITY): Payer: Medicare Other | Admitting: Occupational Therapy

## 2016-09-01 NOTE — Progress Notes (Signed)
Occupational Therapy Session Note  Patient Details  Name: Yvonne Parrish MRN: [id number] Date of Birth: Mar 18, 1929  Today's Date: 09/01/2016 OT Group Time: 1012-1058 OT Group Time Calculation (min): 46 min   Short Term Goals: Week 1:  OT Short Term Goal 1 (Week 1): Pt will transfer to toilet/ BSC with mod A  OT Short Term Goal 1 - Progress (Week 1): Progressing toward goal OT Short Term Goal 2 (Week 1): Pt will perform sit to stand with mod A in prep for LB dressing  OT Short Term Goal 2 - Progress (Week 1): Met OT Short Term Goal 3 (Week 1): Pt will thread LB clothing with min  A OT Short Term Goal 3 - Progress (Week 1): Met OT Short Term Goal 4 (Week 1): Pt will use right UE as gross assist with min A in functional task OT Short Term Goal 4 - Progress (Week 1): Met OT Short Term Goal 5 (Week 1): Pt will don shirt with min A  OT Short Term Goal 5 - Progress (Week 1): Met Week 2:  OT Short Term Goal 1 (Week 2): Pt will transfer to toilet/ BSC with mod A  OT Short Term Goal 2 (Week 2): Pt will don pants at sit > stand level with min assist OT Short Term Goal 3 (Week 2): Pt will complete UB dressing with supervision OT Short Term Goal 4 (Week 2): Pt will utilize RUE at diminished level during functional task of self-feeding or dressing     Skilled Therapeutic Interventions/Progress Updates: Pt participated in neuro UE group tx. Pt was propelled to dayroom by OT. She then participated in guided chair yoga at edge of chair, UE exercises focusing on midline orientation, trunk control, bilateral integration, and R UE NMR. Pt exhibited difficultly with upright postural alignment due to kyphotic posture with exercises modified to prevent pain/discomfort. Assist for right LE movement as well as for R UE shoulder abduction <90 degrees. Pt required 4 rest breaks to accommodate fatigue. At end of session pt was propelled back to room by OT and left with all needs within reach.      Therapy  Documentation Precautions:  Precautions Precautions: Fall Precaution Comments: chronic right foot drop Restrictions Weight Bearing Restrictions: No  Pain: Min c/o back pain that resolved with rest breaks   ADL: ADL ADL Comments: see functional ambulation     See Function Navigator for Current Functional Status.   Therapy/Group: Group Therapy  Chanie Soucek A Lukah Goswami 09/01/2016, 12:23 PM

## 2016-09-01 NOTE — Progress Notes (Signed)
[facility] PHYSICAL MEDICINE & REHABILITATION     PROGRESS NOTE  Subjective/Complaints:  Up waiting for breakfast. "Hungry!" ROS: pt denies nausea, vomiting, diarrhea, cough, shortness of breath or chest pain  Objective: Vital Signs: Blood pressure (!) 181/49, pulse 74, temperature 97.9 F (36.6 C), temperature source Oral, resp. rate 18, height 5\' 4"  (1.626 m), weight 67 kg (147 lb 11.3 oz), SpO2 97 %. No results found. No results for input(s): WBC, HGB, HCT, PLT in the last 72 hours. No results for input(s): NA, K, CL, GLUCOSE, BUN, CREATININE, CALCIUM in the last 72 hours.  Invalid input(s): CO CBG (last 3)  No results for input(s): GLUCAP in the last 72 hours.  Wt Readings from Last 3 Encounters:  09/01/16 67 kg (147 lb 11.3 oz)  08/20/16 67.2 kg (148 lb 1.6 oz)    Physical Exam:  BP (!) 181/49 (BP Location: Right Arm)   Pulse 74   Temp 97.9 F (36.6 C) (Oral)   Resp 18   Ht 5\' 4"  (1.626 m)   Wt 67 kg (147 lb 11.3 oz)   SpO2 97%   BMI 25.35 kg/m  Constitutional: She appears well-developedand well-nourished.  HENT: Normocephalicand atraumatic.  Eyes: EOMI. No discharge.  Cardiovascular: RRR. No JVD.  Respiratory: Effort normal. Clear GI: Soft. Bowel sounds are normal.   Musculoskeletal: No tenderness. +Edema.   Neurological: She is alert.   Right facial weakness Speech with ataxic dysarthria.  Oriented x3.  Motor: RUE: 2-3/5 proximal to distal,. RLE 2+ to 3/5 RLE: 1/5 proximal to distal (unchanged) Skin: Skin is warmand dry. Intact.   Assessment/Plan: 1. Functional deficits secondary to Left paramedian pontine infarct which require 3+ hours per day of interdisciplinary therapy in a comprehensive inpatient rehab setting. Physiatrist is providing close team supervision and 24 hour management of active medical problems listed below. Physiatrist and rehab team continue to assess barriers to discharge/monitor patient progress toward functional and medical  goals.  Function:  Bathing Bathing position   Position: Wheelchair/chair at sink  Bathing parts Body parts bathed by patient: Right arm, Chest, Abdomen, Front perineal area, Buttocks, Left arm (deferred LB due to RN just applying muscle rub to knees) Body parts bathed by helper: Left arm, Right lower leg, Left lower leg, Back  Bathing assist Assist Level: Touching or steadying assistance(Pt > 75%)      Upper Body Dressing/Undressing Upper body dressing   What is the patient wearing?: Pull over shirt/dress Bra - Perfomed by patient: Thread/unthread right bra strap, Thread/unthread left bra strap Bra - Perfomed by helper: Hook/unhook bra (pull down sports bra) Pull over shirt/dress - Perfomed by patient: Thread/unthread right sleeve, Thread/unthread left sleeve, Pull shirt over trunk, Put head through opening Pull over shirt/dress - Perfomed by helper: Put head through opening        Upper body assist Assist Level: Set up      Lower Body Dressing/Undressing Lower body dressing   What is the patient wearing?: Pants, Socks, Shoes     Pants- Performed by patient: Thread/unthread right pants leg, Thread/unthread left pants leg, Pull pants up/down Pants- Performed by helper: Pull pants up/down   Non-skid slipper socks- Performed by helper: Don/doff right sock, Don/doff left sock Socks - Performed by patient: Don/doff right sock, Don/doff left sock Socks - Performed by helper: Don/doff right sock, Don/doff left sock Shoes - Performed by patient: Don/doff right shoe, Don/doff left shoe, Fasten left Shoes - Performed by helper: Fasten right  Lower body assist Assist for lower body dressing: Touching or steadying assistance (Pt > 75%)      Toileting Toileting   Toileting steps completed by patient: Performs perineal hygiene Toileting steps completed by helper: Adjust clothing prior to toileting, Adjust clothing after toileting Toileting Assistive Devices: Grab bar or  rail  Toileting assist Assist level: Touching or steadying assistance (Pt.75%)   Transfers Chair/bed transfer   Chair/bed transfer method: Stand pivot Chair/bed transfer assist level: Moderate assist (Pt 50 - 74%/lift or lower) Chair/bed transfer assistive device: Armrests, Walker Mechanical lift: Ecologist     Max distance: 100 Assist level: Supervision or verbal cues   Wheelchair   Type: Manual Max wheelchair distance: 150 Assist Level: Supervision or verbal cues  Cognition Comprehension Comprehension assist level: Follows basic conversation/direction with extra time/assistive device  Expression Expression assist level: Expresses complex 90% of the time/cues < 10% of the time  Social Interaction Social Interaction assist level: Interacts appropriately with others with medication or extra time (anti-anxiety, antidepressant).  Problem Solving Problem solving assist level: Solves basic problems with no assist  Memory Memory assist level: Recognizes or recalls 90% of the time/requires cueing < 10% of the time     Medical Problem List and Plan: 1. Right hemiparesis secondary to Left paramedian pontine infarct  Cont CIR PT, OT  Bracing ordered  Fluoxetine started 1/14 2. DVT Prophylaxis/Anticoagulation: Pharmaceutical: Lovenox 3. Chronic back pain/spasms/Pain Management: Continue flexeril prn 4. Mood: Stable. LCSW to follow for evaluation and support.  5. Neuropsych: This patient is notcapable of making decisions on herown behalf, controlled at present 6. Skin/Wound Care: Routine pressure relief measures. Maintain adequate nutrition and hydration status.  7. Fluids/Electrolytes/Nutrition: Monitor I/O  Diet advanced to regular.  8. HTN: Monitor BP bid  Continue lisinopril   Norvasc 5mg   -occasional elevation over the last 24-48 hrs---not enough to increase meds yet---follow for now Vitals:   08/31/16 1313 09/01/16 0504  BP: (!) 166/51 (!) 181/49   Pulse: 67 74  Resp: 18 18  Temp: 98.8 F (37.1 C) 97.9 F (36.6 C)   9. 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  10. H/o asthma/Interstitial lung disease: Monitor respiratory status with increase in activity. albuterol MDI prn 11 Chronic systolic CHF: On ASA, metoprolol and zocor. Monitor for signs of overload. Heart healthy/Low salt restrictions.   -weights holding    -off IVF now-  Filed Weights   08/30/16 0500 08/31/16 0621 09/01/16 0504  Weight: 66.5 kg (146 lb 9.7 oz) 66 kg (145 lb 8.1 oz) 67 kg (147 lb 11.3 oz)   12. Chronic constipation: Resumed miralax.  13. GERD: Wth chronic nausea--continue pepcid for now. Small portions at meals.   May need PPI.  14. Leukocytosis resolved   WBCs 9.7 on 1/12   Cont to monitor 15. ABLA  Hb 11.9 on 1/12, 11.8 on 1/15    Cont to monitor 16. Hypokalemia  K+ 4.4 on 1/15  Supplementing until 11/14 17. Sleep disturbance  Trazodone PRN  Improved  LOS (Days) 10 A FACE TO FACE EVALUATION WAS PERFORMED  Leafy Motsinger T 09/01/2016 10:33 AM

## 2016-09-02 ENCOUNTER — Inpatient Hospital Stay (HOSPITAL_COMMUNITY): Payer: Medicare Other | Admitting: Speech Pathology

## 2016-09-02 ENCOUNTER — Inpatient Hospital Stay (HOSPITAL_COMMUNITY): Payer: Medicare Other | Admitting: Physical Therapy

## 2016-09-02 ENCOUNTER — Inpatient Hospital Stay (HOSPITAL_COMMUNITY): Payer: Medicare Other | Admitting: Occupational Therapy

## 2016-09-02 NOTE — Progress Notes (Signed)
Physical Therapy Session Note  Patient Details  Name: Yvonne Parrish MRN: [id number] Date of Birth: 01/17/1929  Today's Date: 09/02/2016 PT Individual Time: 1300-1400 PT Individual Time Calculation (min): 60 min   Short Term Goals: Week 2:  PT Short Term Goal 1 (Week 2): =LTGs  Skilled Therapeutic Interventions/Progress Updates:    Pt resting in w/c with no c/o pain agreeable to session focus on gait training, trial with GRF AFO, and NMR for hip strategy in standing and sitting balance with functional use of RUE.   Pt ambulated throughout the gym, max distance 100', with RW, with and without AFO, both with close supervision but with significant improvement in gait pattern with AFO donned.    NMR in standing on foam wedge focus on hip strategy to maintain balance without UE support, initial min assist fade to close supervision with time.  NMR for trunk mobility, dynamic sitting balance with minimal LE support (single limb fade to toe touch support), and functional use of RUE for clothespin task.    Pt notably fatigued during session and requesting to return to bed at completion of session.  Returned to room total assist for energy conservation, ambulatory transfer back to bed with RW and supervision and pt transitioned sit>supine with supervision and increased time.  Call bell in reach and needs met.   Therapy Documentation Precautions:  Precautions Precautions: Fall Precaution Comments: chronic right foot drop Restrictions Weight Bearing Restrictions: No   See Function Navigator for Current Functional Status.   Therapy/Group: Individual Therapy   E Penven-Crew 09/02/2016, 2:01 PM  

## 2016-09-02 NOTE — Progress Notes (Signed)
Occupational Therapy Session Note  Patient Details  Name: Yvonne Parrish MRN: [id number] Date of Birth: 05/13/1929  Today's Date: 09/02/2016 OT Individual Time: [id number] OT Individual Time Calculation (min): 58 min    Short Term Goals: Week 2:  OT Short Term Goal 1 (Week 2): Pt will transfer to toilet/ BSC with mod A  OT Short Term Goal 2 (Week 2): Pt will don pants at sit > stand level with min assist OT Short Term Goal 3 (Week 2): Pt will complete UB dressing with supervision OT Short Term Goal 4 (Week 2): Pt will utilize RUE at diminished level during functional task of self-feeding or dressing  Skilled Therapeutic Interventions/Progress Updates:    Treatment session with focus on functional mobility, transfers, self-care retraining, and functional use of dominant RUE during self-care tasks.  Pt received in bed reporting not sleeping well but willing to participate in treatment session.  Completed bed mobility with supervision, requiring mod assist for stand pivot transfers due to need for lifting assistance.  Completed toileting with mod assist transfers, pt able to manage clothing and hygiene with min/steady assist for balance.  Ambulated with HHA from therapist from toilet to room shower with mod assist and difficulty advancing RLE.  Bathing completed with min/steady assist for standing balance when washing buttocks.  Min assist stand pivot transfer out of shower with increased time to manage RLE.  Pt with good recall of hemi-dressing technique with ability to complete all dressing with setup and min assist for standing balance when pulling up pants.  Oral care completed in sitting due to fatigue.  Pt utilized RUE as diminished level approx 50% of self-care tasks this session.  Therapy Documentation Precautions:  Precautions Precautions: Fall Precaution Comments: chronic right foot drop Restrictions Weight Bearing Restrictions: No General:   Vital Signs:   Pain: Pain  Assessment Pain Score: 0-No pain Pain Intervention(s): Medication (See eMAR)  See Function Navigator for Current Functional Status.   Therapy/Group: Individual Therapy  Simonne Come 09/02/2016, 10:32 AM

## 2016-09-02 NOTE — Progress Notes (Signed)
[facility] PHYSICAL MEDICINE & REHABILITATION     PROGRESS NOTE  Subjective/Complaints:  Some Right shoulder and back pain  overnite except slept poorly, no falls or trauma, no pain this am ROS: pt denies nausea, vomiting, diarrhea, cough, shortness of breath or chest pain  Objective: Vital Signs: Blood pressure 129/68, pulse 70, temperature 97.9 F (36.6 C), temperature source Oral, resp. rate 16, height 5\' 4"  (1.626 m), weight 67.7 kg (149 lb 4 oz), SpO2 100 %. No results found. No results for input(s): WBC, HGB, HCT, PLT in the last 72 hours. No results for input(s): NA, K, CL, GLUCOSE, BUN, CREATININE, CALCIUM in the last 72 hours.  Invalid input(s): CO CBG (last 3)  No results for input(s): GLUCAP in the last 72 hours.  Wt Readings from Last 3 Encounters:  09/02/16 67.7 kg (149 lb 4 oz)  08/20/16 67.2 kg (148 lb 1.6 oz)    Physical Exam:  BP 129/68 (BP Location: Right Arm)   Pulse 70   Temp 97.9 F (36.6 C) (Oral)   Resp 16   Ht 5\' 4"  (1.626 m)   Wt 67.7 kg (149 lb 4 oz)   SpO2 100%   BMI 25.62 kg/m  Constitutional: She appears well-developedand well-nourished.  HENT: Normocephalicand atraumatic.  Eyes: EOMI. No discharge.  Cardiovascular: RRR. No JVD.  Respiratory: Effort normal. Clear GI: Soft. Bowel sounds are normal.   Musculoskeletal: No tenderness. +Edema.  No pain with R shoulder ROM Neurological: She is alert.   Right facial weakness Speech without dysarthria.  Oriented x3.  Motor: RUE: 2-3/5 proximal to distal,. RLE 2+ to 3/5 RLE: 1/5 proximal to distal (unchanged) Skin: Skin is warmand dry. Intact.   Assessment/Plan: 1. Functional deficits secondary to Left paramedian pontine infarct which require 3+ hours per day of interdisciplinary therapy in a comprehensive inpatient rehab setting. Physiatrist is providing close team supervision and 24 hour management of active medical problems listed below. Physiatrist and rehab team continue to assess  barriers to discharge/monitor patient progress toward functional and medical goals.  Function:  Bathing Bathing position   Position: Wheelchair/chair at sink  Bathing parts Body parts bathed by patient: Right arm, Chest, Abdomen, Front perineal area, Buttocks, Left arm (deferred LB due to RN just applying muscle rub to knees) Body parts bathed by helper: Left arm, Right lower leg, Left lower leg, Back  Bathing assist Assist Level: Touching or steadying assistance(Pt > 75%)      Upper Body Dressing/Undressing Upper body dressing   What is the patient wearing?: Pull over shirt/dress Bra - Perfomed by patient: Thread/unthread right bra strap, Thread/unthread left bra strap Bra - Perfomed by helper: Hook/unhook bra (pull down sports bra) Pull over shirt/dress - Perfomed by patient: Thread/unthread right sleeve, Thread/unthread left sleeve, Pull shirt over trunk, Put head through opening Pull over shirt/dress - Perfomed by helper: Put head through opening        Upper body assist Assist Level: Set up      Lower Body Dressing/Undressing Lower body dressing   What is the patient wearing?: Pants, Socks, Shoes     Pants- Performed by patient: Thread/unthread right pants leg, Thread/unthread left pants leg, Pull pants up/down Pants- Performed by helper: Pull pants up/down   Non-skid slipper socks- Performed by helper: Don/doff right sock, Don/doff left sock Socks - Performed by patient: Don/doff right sock, Don/doff left sock Socks - Performed by helper: Don/doff right sock, Don/doff left sock Shoes - Performed by patient: Don/doff right shoe, Don/doff  left shoe, Fasten left Shoes - Performed by helper: Fasten right          Lower body assist Assist for lower body dressing: Touching or steadying assistance (Pt > 75%)      Toileting Toileting   Toileting steps completed by patient: Performs perineal hygiene Toileting steps completed by helper: Adjust clothing prior to toileting,  Adjust clothing after toileting Toileting Assistive Devices: Grab bar or rail  Toileting assist Assist level: Touching or steadying assistance (Pt.75%)   Transfers Chair/bed transfer   Chair/bed transfer method: Stand pivot Chair/bed transfer assist level: Moderate assist (Pt 50 - 74%/lift or lower) Chair/bed transfer assistive device: Armrests, Walker Mechanical lift: Ecologist     Max distance: 100 Assist level: Supervision or verbal cues   Wheelchair   Type: Manual Max wheelchair distance: 150 Assist Level: Supervision or verbal cues  Cognition Comprehension Comprehension assist level: Follows basic conversation/direction with no assist  Expression Expression assist level: Expresses complex 90% of the time/cues < 10% of the time  Social Interaction Social Interaction assist level: Interacts appropriately with others with medication or extra time (anti-anxiety, antidepressant).  Problem Solving Problem solving assist level: Solves basic problems with no assist  Memory Memory assist level: Recognizes or recalls 90% of the time/requires cueing < 10% of the time     Medical Problem List and Plan: 1. Right hemiparesis secondary to Left paramedian pontine infarct  Cont CIR PT, OT  Bracing ordered  Fluoxetine started 1/14 2. DVT Prophylaxis/Anticoagulation: Pharmaceutical: Lovenox 3. Chronic back pain/spasms/Pain Management: Continue flexeril prn 4. Mood: Stable. LCSW to follow for evaluation and support.  5. Neuropsych: This patient is notcapable of making decisions on herown behalf, controlled at present 6. Skin/Wound Care: Routine pressure relief measures. Maintain adequate nutrition and hydration status.  7. Fluids/Electrolytes/Nutrition: Monitor I/O  Diet advanced to regular.  8. HTN: Monitor BP bid  Continue lisinopril   Norvasc 5mg   -well controlled Vitals:   09/01/16 1430 09/02/16 0407  BP: (!) 120/57 129/68  Pulse: 72 70  Resp: 17 16   Temp: 97.5 F (36.4 C) 97.9 F (36.6 C)   9. 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  10. H/o asthma/Interstitial lung disease: Monitor respiratory status with increase in activity. albuterol MDI prn 11 Chronic systolic CHF: On ASA, metoprolol and zocor. Monitor for signs of overload. Heart healthy/Low salt restrictions.   -weights holding    -off IVF now-  Filed Weights   08/31/16 0621 09/01/16 0504 09/02/16 0407  Weight: 66 kg (145 lb 8.1 oz) 67 kg (147 lb 11.3 oz) 67.7 kg (149 lb 4 oz)   12. Chronic constipation: Resumed miralax.  13. GERD: Wth chronic nausea--continue pepcid for now.doing well with this currently    14. Hypokalemia  K+ 4.4 on 1/15, will recheck off supplementation  17. Sleep disturbance  Trazodone PRN  Improved  LOS (Days) 11 A FACE TO FACE EVALUATION WAS PERFORMED  Charlett Blake 09/02/2016 7:57 AM

## 2016-09-02 NOTE — Progress Notes (Signed)
Physical Therapy Session Note  Patient Details  Name: Yvonne Parrish MRN: [id number] Date of Birth: [birth year]  Today's Date: 09/02/2016 PT Individual Time: 1115-1200 PT Individual Time Calculation (min): 45 min   Short Term Goals: Week 2:  PT Short Term Goal 1 (Week 2): =LTGs  Skilled Therapeutic Interventions/Progress Updates:  Pt with no c/o pain, agreeable to therapy session focus on activity tolerance, w/c mobility, stair negotiation, NMR, and pt education.   Pt propelled w/c to therapy gym with supervision and increased time using L hemi-technique.    Stair negotiation x8 steps with 2 rails, overall steady assist with min verbal cues for sequencing.  Pt attempted one step leading with RLE, continued with steady assist but reports she didn't feel as confident leading with RLE, educated pt on benefits of leading with RLE versus LLE.  Discussed pt's home set up with stairs (2 small steps with grab bar used as a rail, pt unsure of side).  Attempted nustep for reciprocal stepping pattern retraining and overall activity tolerance, but pt reports increased discomfort in B knees after 2 minutes, so activity stopped.    Pt transfers throughout session with overall steady assist for sit<>stand and squat/pivot.  Pt returned to room at end of session and positioned with call bell in reach and needs met.      Therapy Documentation Precautions:  Precautions Precautions: Fall Precaution Comments: chronic right foot drop Restrictions Weight Bearing Restrictions: No   See Function Navigator for Current Functional Status.   Therapy/Group: Individual Therapy  Aunesty Tyson E Penven-Crew 09/02/2016, 12:12 PM

## 2016-09-02 NOTE — Progress Notes (Signed)
Speech Language Pathology Daily Session Note  Patient Details  Name: Yvonne Parrish MRN: [id number] Date of Birth: Mar 20, 1929  Today's Date: 09/02/2016 SLP Individual Time: [id number] SLP Individual Time Calculation (min): 40 min  Short Term Goals: Week 2: SLP Short Term Goal 1 (Week 2): Pt will utilize speech intelligibility strategies at the conversational level with mod I in a distracting environment to achieve >90% intelligibility.  SLP Short Term Goal 2 (Week 2): Pt will demonstrate self-monitoring and correction of speech intelligibility at the conversational level with mod I.  SLP Short Term Goal 3 (Week 2): Pt will utilize word-finding strategies during structured language activies with mod I.  SLP Short Term Goal 4 (Week 2): Pt will consume meds whole with liquids with mod I use of swallowing precautions x1 pior to advancement.    Skilled Therapeutic Interventions: Pt was seen for skilled ST targeting speech goals.  SLP facilitated the session with a previously taught verbal description task to address use of compensatory speech intelligibility and word finding strategies. Pt was mod I for intelligibility in a mildly distracting environment at the conversational level during both structured and unstructured tasks.  No cues were needed for word finding during session.  Pt reports that she feels she needs to continue working on her speech as it does not sound "back to normal" to her.  Given that pt is nearing long term goals but would like to continue to address communication, recommend decreasing ST frequency to 1-3x/week.  Pt in agreement with plan of care.  Pt left in wheelchair with call bell within reach.  Continue per current plan of care.       Function:  Eating Eating                 Cognition Comprehension Comprehension assist level: Follows basic conversation/direction with no assist  Expression   Expression assist level: Expresses complex 90% of the time/cues < 10% of  the time  Social Interaction Social Interaction assist level: Interacts appropriately with others with medication or extra time (anti-anxiety, antidepressant).  Problem Solving Problem solving assist level: Solves basic problems with no assist  Memory Memory assist level: Recognizes or recalls 90% of the time/requires cueing < 10% of the time    Pain Pain Assessment Pain Assessment: No/denies pain Pain Score: 0-No pain  Therapy/Group: Individual Therapy  Yvonne Parrish, Yvonne Parrish 09/02/2016, 12:38 PM

## 2016-09-03 ENCOUNTER — Inpatient Hospital Stay (HOSPITAL_COMMUNITY): Payer: Medicare Other | Admitting: Speech Pathology

## 2016-09-03 ENCOUNTER — Inpatient Hospital Stay (HOSPITAL_COMMUNITY): Payer: Medicare Other | Admitting: Physical Therapy

## 2016-09-03 ENCOUNTER — Inpatient Hospital Stay (HOSPITAL_COMMUNITY): Payer: Medicare Other | Admitting: Occupational Therapy

## 2016-09-03 NOTE — Progress Notes (Signed)
Occupational Therapy Session Note  Patient Details  Name: Yvonne Parrish MRN: [id number] Date of Birth: 1928-09-10  Today's Date: 09/03/2016 OT Individual Time: [id number] OT Individual Time Calculation (min): 59 min    Short Term Goals: Week 2:  OT Short Term Goal 1 (Week 2): Pt will transfer to toilet/ BSC with mod A  OT Short Term Goal 2 (Week 2): Pt will don pants at sit > stand level with min assist OT Short Term Goal 3 (Week 2): Pt will complete UB dressing with supervision OT Short Term Goal 4 (Week 2): Pt will utilize RUE at diminished level during functional task of self-feeding or dressing  Skilled Therapeutic Interventions/Progress Updates:    Treatment session with focus on functional mobility in home environment with and without RW.  Engaged in discussion of bathroom setup with use of pictures from family to simulate bathroom access.  Utilized counters in ADL kitchen to complete side stepping as needed to access bathroom at home with pt able to complete sidestepping with UE support with min guard.  Utilized hand held assist to access toilet, transitioning from counter support to toilet as RW will most likely not fit in the bathroom.  Educated on walk-in shower transfers with use of 3" ledge, similar setup to home environment, with use of grab bar along back wall and hand held assist.  Also educated on removing shower door and placing RW outside door when exiting shower to increase safety, if RW will fit in bathroom.  Measured RW and BSC for daughter to measure spaces at home.  Pt able to don and doff Rt shoe with AFO with increased time.  Pt passed off to PT.  Therapy Documentation Precautions:  Precautions Precautions: Fall Precaution Comments: chronic right foot drop Restrictions Weight Bearing Restrictions: No General:   Vital Signs: Therapy Vitals Temp: 98.5 F (36.9 C) Temp Source: Oral Pulse Rate: 62 Resp: 18 BP: (!) 142/57 Patient Position (if appropriate):  Sitting Oxygen Therapy SpO2: 100 % O2 Device: Not Delivered Pain:  Pt with no c/o pain  See Function Navigator for Current Functional Status.   Therapy/Group: Individual Therapy  Simonne Come 09/03/2016, 4:10 PM

## 2016-09-03 NOTE — Progress Notes (Signed)
Physical Therapy Session Note  Patient Details  Name: Yvonne Parrish MRN: [id number] Date of Birth: 09-30-1928  Today's Date: 09/03/2016 PT Individual Time: 8377-9396 PT Individual Time Calculation (min): 29 min   Short Term Goals: Week 2:  PT Short Term Goal 1 (Week 2): =LTGs   Therapy Documentation Precautions:  Precautions Precautions: Fall Precaution Comments: chronic right foot drop Restrictions Weight Bearing Restrictions: No  Pain: Patient denies any pain  Patient received directly from [facility] for continued care.   Session focused in functional mobility with rollator. Discussed with patient locking and unlock brakes and reviewed proper technique for transferring on and off of rollator.   Patient ambulated 50 and 100 feet with rollator and left foot-up brace and supervision. Verbal cues for rollator management, positioning in rollator and upright posture overall as well as increased step length.  Patient up and down 8 steps with right handrail with left foot up brace with a step to pattern. Patient educated on proper sequence and technique. Verbal cues for proper sequence and technique.   Patient required min guard for transferring on and off rollator.  Patient returned to room at end of session with all needs met and daughter present.    See Function Navigator for Current Functional Status.   Therapy/Group: Individual Therapy  Retta Diones 09/03/2016, 4:20 PM

## 2016-09-03 NOTE — Progress Notes (Signed)
[facility] PHYSICAL MEDICINE & REHABILITATION     PROGRESS NOTE  Subjective/Complaints:  No issues overnite, no knee pain ROS: pt denies nausea, vomiting, diarrhea, cough, shortness of breath or chest pain  Objective: Vital Signs: Blood pressure (!) 161/55, pulse 67, temperature 97.1 F (36.2 C), temperature source Oral, resp. rate 18, height 5\' 4"  (1.626 m), weight 67.3 kg (148 lb 5.9 oz), SpO2 95 %. No results found. No results for input(s): WBC, HGB, HCT, PLT in the last 72 hours. No results for input(s): NA, K, CL, GLUCOSE, BUN, CREATININE, CALCIUM in the last 72 hours.  Invalid input(s): CO CBG (last 3)  No results for input(s): GLUCAP in the last 72 hours.  Wt Readings from Last 3 Encounters:  09/03/16 67.3 kg (148 lb 5.9 oz)  08/20/16 67.2 kg (148 lb 1.6 oz)    Physical Exam:  BP (!) 161/55 (BP Location: Right Arm)   Pulse 67   Temp 97.1 F (36.2 C) (Oral)   Resp 18   Ht 5\' 4"  (1.626 m)   Wt 67.3 kg (148 lb 5.9 oz)   SpO2 95%   BMI 25.47 kg/m  Constitutional: She appears well-developedand well-nourished.  HENT: Normocephalicand atraumatic.  Eyes: EOMI. No discharge.  Cardiovascular: RRR. No JVD.  Respiratory: Effort normal. Clear GI: Soft. Bowel sounds are normal.   Musculoskeletal: No tenderness. +Edema.  No pain with R shoulder ROM Neurological: She is alert.   Right facial weakness Speech without dysarthria.  Oriented x3.  Motor: RUE: 2-3/5 proximal to distal,. RLE 2+ to 3/5 RLE: 1/5 proximal to distal (unchanged) Skin: Skin is warmand dry. Intact.   Assessment/Plan: 1. Functional deficits secondary to Left paramedian pontine infarct which require 3+ hours per day of interdisciplinary therapy in a comprehensive inpatient rehab setting. Physiatrist is providing close team supervision and 24 hour management of active medical problems listed below. Physiatrist and rehab team continue to assess barriers to discharge/monitor patient progress toward  functional and medical goals.  Function:  Bathing Bathing position   Position: Shower  Bathing parts Body parts bathed by patient: Right arm, Left arm, Chest, Abdomen, Front perineal area, Buttocks, Right upper leg, Left upper leg Body parts bathed by helper: Right lower leg, Left lower leg, Back  Bathing assist Assist Level:  (Min assist)      Upper Body Dressing/Undressing Upper body dressing   What is the patient wearing?: Pull over shirt/dress Bra - Perfomed by patient: Thread/unthread right bra strap, Thread/unthread left bra strap Bra - Perfomed by helper: Hook/unhook bra (pull down sports bra) Pull over shirt/dress - Perfomed by patient: Thread/unthread right sleeve, Thread/unthread left sleeve, Pull shirt over trunk, Put head through opening Pull over shirt/dress - Perfomed by helper: Put head through opening        Upper body assist Assist Level: Set up      Lower Body Dressing/Undressing Lower body dressing   What is the patient wearing?: Pants, Socks, Shoes     Pants- Performed by patient: Thread/unthread right pants leg, Thread/unthread left pants leg, Pull pants up/down Pants- Performed by helper: Pull pants up/down   Non-skid slipper socks- Performed by helper: Don/doff right sock, Don/doff left sock Socks - Performed by patient: Don/doff right sock, Don/doff left sock Socks - Performed by helper: Don/doff right sock, Don/doff left sock Shoes - Performed by patient: Don/doff right shoe, Don/doff left shoe, Fasten left, Fasten right Shoes - Performed by helper: Fasten right          Lower  body assist Assist for lower body dressing: Touching or steadying assistance (Pt > 75%)      Toileting Toileting   Toileting steps completed by patient: (P) Adjust clothing prior to toileting, Performs perineal hygiene Toileting steps completed by helper: (P) Adjust clothing after toileting Toileting Assistive Devices: (P) Grab bar or rail  Toileting assist Assist  level: (P) Touching or steadying assistance (Pt.75%)   Transfers Chair/bed transfer   Chair/bed transfer method: Ambulatory, Squat pivot Chair/bed transfer assist level: Touching or steadying assistance (Pt > 75%) Chair/bed transfer assistive device: Armrests, Walker, Orthosis Mechanical lift: Ecologist     Max distance: 100 Assist level: Supervision or verbal cues   Wheelchair   Type: Manual Max wheelchair distance: 150 Assist Level: Supervision or verbal cues  Cognition Comprehension Comprehension assist level: Follows basic conversation/direction with no assist  Expression Expression assist level: Expresses complex 90% of the time/cues < 10% of the time  Social Interaction Social Interaction assist level: Interacts appropriately with others with medication or extra time (anti-anxiety, antidepressant).  Problem Solving Problem solving assist level: Solves basic problems with no assist  Memory Memory assist level: Recognizes or recalls 90% of the time/requires cueing < 10% of the time     Medical Problem List and Plan: 1. Right hemiparesis secondary to Left paramedian pontine infarct  Cont CIR PT, OT, team conf in am  Bracing ordered  Fluoxetine started 1/14 2. DVT Prophylaxis/Anticoagulation: Pharmaceutical: Lovenox 3. Chronic back pain/spasms/Pain Management: Continue flexeril prn 4. Mood: Stable. LCSW to follow for evaluation and support.  5. Neuropsych: This patient is notcapable of making decisions on herown behalf, controlled at present 6. Skin/Wound Care: Routine pressure relief measures. Maintain adequate nutrition and hydration status.  7. Fluids/Electrolytes/Nutrition: Monitor I/O  Diet advanced to regular.  8. HTN: Monitor BP bid  Continue lisinopril   Norvasc 5mg   -well controlled Vitals:   09/02/16 1300 09/03/16 0500  BP: (!) 129/41 (!) 161/55  Pulse: (!) 56 67  Resp: 17 18  Temp: 98 F (36.7 C) 97.1 F (36.2 C)   9. CAD s/p  CABG '99: On metoprolol --ASA changed to aggrenox.  10. H/o asthma/Interstitial lung disease: Monitor respiratory status with increase in activity. albuterol MDI prn 11 Chronic systolic CHF: On ASA, metoprolol and zocor. Monitor for signs of overload. Heart healthy/Low salt restrictions.   -weights holding at 67Kg    Filed Weights   09/01/16 0504 09/02/16 0407 09/03/16 0500  Weight: 67 kg (147 lb 11.3 oz) 67.7 kg (149 lb 4 oz) 67.3 kg (148 lb 5.9 oz)   12. Chronic constipation: Resumed miralax. Last BM 1/22 13. GERD: Wth chronic nausea--continue pepcid for now.doing well with this currently    14. Hypokalemia  K+ 4.4 on 1/15, will recheck BMET off KCL  17. Sleep disturbance  Trazodone PRN  Improved  LOS (Days) 12 A FACE TO FACE EVALUATION WAS PERFORMED  Alysia Penna E 09/03/2016 8:10 AM

## 2016-09-03 NOTE — Progress Notes (Signed)
Physical Therapy Session Note  Patient Details  Name: Yvonne Parrish MRN: [id number] Date of Birth: [birth year]  Today's Date: 09/03/2016 PT Individual Time: 1100-1200 PT Individual Time Calculation (min): 60 min   Short Term Goals: Week 2:  PT Short Term Goal 1 (Week 2): =LTGs  Skilled Therapeutic Interventions/Progress Updates:   No c/o pain, agreeable to session focus on w/c propulsion, orthotic fit/education, high level gait training, transfers, and activity tolerance.    Pt propelled w/c to therapy gym with distant supervision using L hemi technique for mobility and activity tolerance.  Orthotist delivered Toe Off brace and PT provided ongoing education for purpose of AFO, care, donning/doffing, and benefits, which pt verbalized understanding to all.  High level gait training through obstacle course with min assist fade to close supervision for up/down ramp, over/around obstacles, compliant surfaces, and negotiating a curb step with RW.  Pt required rest breaks in between 2 trials of obstacle course.  Transfers throughout session with supervision from hemi-height w/c using BUEs to push from w/c, from standard chair without armrests, and from therapy mat.  Pt returned to room at end of session and positioned upright in w/c with call bell in reach and needs met.   Therapy Documentation Precautions:  Precautions Precautions: Fall Precaution Comments: chronic right foot drop Restrictions Weight Bearing Restrictions: No   See Function Navigator for Current Functional Status.   Therapy/Group: Individual Therapy  Earnest Conroy Penven-Crew 09/03/2016, 12:04 PM

## 2016-09-03 NOTE — Plan of Care (Signed)
Problem: RH Balance Goal: LTG Patient will maintain dynamic standing with ADLs (OT) LTG:  Patient will maintain dynamic standing balance with assist during activities of daily living (OT)   Upgraded due to progress  Problem: RH Bathing Goal: LTG Patient will bathe with assist, cues/equipment (OT) LTG: Patient will bathe specified number of body parts with assist with/without cues using equipment (position)  (OT)  Upgraded due to progress  Problem: RH Dressing Goal: LTG Patient will perform lower body dressing w/assist (OT) LTG: Patient will perform lower body dressing with assist, with/without cues in positioning using equipment (OT)  Upgraded due to progress

## 2016-09-03 NOTE — Progress Notes (Signed)
Speech Language Pathology Daily Session Note  Patient Details  Name: Yvonne Parrish MRN: [id number] Date of Birth: [birth year]  Today's Date: 09/03/2016 SLP Individual Time: 1315-1350 SLP Individual Time Calculation (min): 35 min  Short Term Goals: Week 2: SLP Short Term Goal 1 (Week 2): Pt will utilize speech intelligibility strategies at the conversational level with mod I in a distracting environment to achieve >90% intelligibility.  SLP Short Term Goal 2 (Week 2): Pt will demonstrate self-monitoring and correction of speech intelligibility at the conversational level with mod I.  SLP Short Term Goal 3 (Week 2): Pt will utilize word-finding strategies during structured language activies with mod I.  SLP Short Term Goal 4 (Week 2): Pt will consume meds whole with liquids with mod I use of swallowing precautions x1 pior to advancement.    Skilled Therapeutic Interventions: Skilled treatment session focused on speech goals. Patient was 100% intelligible and did not demonstrate any difficulty with word-finding at the conversation level with Mod I. However, patient's daughter reports increased difficulty with word-finding and vocal intensity with fatigue. Recommend patient continue with SLP services 3x/week to address above issues. Continue with current plan of care.    Function:  Cognition Comprehension Comprehension assist level: Follows basic conversation/direction with no assist  Expression   Expression assist level: Expresses complex 90% of the time/cues < 10% of the time  Social Interaction Social Interaction assist level: Interacts appropriately with others with medication or extra time (anti-anxiety, antidepressant).  Problem Solving Problem solving assist level: Solves basic problems with no assist  Memory Memory assist level: Recognizes or recalls 90% of the time/requires cueing < 10% of the time    Pain No/Denies Pain   Therapy/Group: Individual Therapy  Leonte Horrigan,  Niv Darley 09/03/2016, 3:20 PM

## 2016-09-04 ENCOUNTER — Inpatient Hospital Stay (HOSPITAL_COMMUNITY): Payer: Medicare Other | Admitting: Occupational Therapy

## 2016-09-04 ENCOUNTER — Inpatient Hospital Stay (HOSPITAL_COMMUNITY): Payer: Medicare Other | Admitting: Physical Therapy

## 2016-09-04 ENCOUNTER — Encounter (HOSPITAL_COMMUNITY): Payer: Medicare Other | Admitting: *Deleted

## 2016-09-04 LAB — BASIC METABOLIC PANEL
Anion gap: 9 (ref 5–15)
BUN: 22 mg/dL — ABNORMAL HIGH (ref 6–20)
CO2: 24 mmol/L (ref 22–32)
Calcium: 9.2 mg/dL (ref 8.9–10.3)
Chloride: 103 mmol/L (ref 101–111)
Creatinine, Ser: 0.87 mg/dL (ref 0.44–1.00)
GFR calc Af Amer: >60 mL/min (ref >60)
GFR calc non Af Amer: 59 mL/min — ABNORMAL LOW (ref >60)
Glucose, Bld: 113 mg/dL — ABNORMAL HIGH (ref 65–99)
Potassium: 3.8 mmol/L (ref 3.5–5.1)
Sodium: 136 mmol/L (ref 135–145)

## 2016-09-04 NOTE — Evaluation (Addendum)
Recreational Therapy Assessment and Plan  Patient Details  Name: Yvonne Parrish MRN: [id number] Date of Birth: [birth year] Today's Date: 09/04/2016  Rehab Potential:  Good   ELOS:   1/27  Assessment Clinical Impression:  Problem List: Patient Active Problem List   Diagnosis Date Noted  . Gait disturbance, post-stroke 08/22/2016  . Left pontine stroke ([place]) 08/22/2016  . Slurred speech   . Chronic obstructive pulmonary disease ([place])   . Coronary artery disease involving coronary bypass graft of native heart without angina pectoris   . Leukocytosis   . Hyperglycemia   . Right hemiparesis ([place])   . COPD exacerbation ([place]) 08/20/2016  . Aphasia 08/20/2016  . Asthma without status asthmaticus 08/20/2016  . CHF (congestive heart failure) ([place]) 08/20/2016  . Hyperlipidemia, unspecified 08/20/2016  . Hypertension 08/20/2016  . Myelodysplastic syndrome ([place]) 08/20/2016  . Osteoporosis, post-menopausal 08/20/2016  . Stroke (cerebrum) ([place]) 08/20/2016  . Ischemic stroke ([place])   . Cardiomyopathy, dilated ([place]) 01/10/2016  . H/O cardiac catheterization 01/10/2016  . Chronic back pain 11/01/2014  . History of coronary artery bypass graft 11/01/2014  . Acquired hypothyroidism 07/05/2014    Past Medical History:      Past Medical History:  Diagnosis Date  . Asthma in child    question of interstitial lung disease  . CHF (congestive heart failure) ([place])   . Chronic back pain   . Chronic pain of right knee    for past 30 years  . Heart murmur   . History of right foot drop   . Hypertension   . Hypothyroidism   . Myelodysplastic syndrome [facility])    Past Surgical History:       Past Surgical History:  Procedure Laterality Date  . CORONARY ANGIOPLASTY    . CORONARY ARTERY BYPASS GRAFT  1999  . KNEE ARTHROSCOPY    . knee fracture     Right knee pinning in '90's   . LUMBAR SPINE SURGERY    . TONSILLECTOMY      Assessment & Plan Clinical  Impression:Patient is a 83 y.o. RH female with history of HTN, interstitial lung disease, CAD s/p CABG, chronic systolic CHF, Chronic back pain/spasma, TIA last summer, who was admitted on 08/21/15 with right sided weakness and difficulty speaking. CT perfusion scan negative. CTA head/neck revealed 3.9 cm ascending thoracic aorta, moderate narrowing of right SA (complex plaque) and R-VA and moderate narrowing of proximal R-PCA. MRI brain done revealing small left paracentral pontine, C3-C4 protrusion with moderate stenosis and cord flattening as well as intracranial atherosclerotic changes involving posterior circulation. Carotid dopplers without significant ICA stenosis. Cardiac echo with EF 55-60%, severely calcified mitral and aortic valve, moderate AVR and moderate pulmonary HTN. Dr. Leonie Man felt that infarct secondary to small vessel disease. Patient declined Plavix and was started on Aggrenox for secondary stroke prevention. MBS done today and diet advanced to regular textures, thin liquids. Patient with resultant right sided weakness with sensory deficits and right knee instability, posterior lean, dysphagia and expressive deficits.  Patient transferred to CIR on 08/22/2016 and referred by team for participation in community reintegration.  Discussed purpose of an outing including potential goals and pt agreeable to participate in an outing today.   Pt participated in community reintegration/outing to at overall contact guard using rollator.   Goals focused on safe community mobility, identification & negotiation of obstacles, accessing public restroom, energy conservation techniques/education.  See outing goal sheet in shadow chart for full details. No further TR. Goals Met.  Plan  Min 1 time for community reintegration >60 minutes  Recommendations for other services: None   Discharge Criteria: Patient will be discharged from TR if patient refuses treatment 3 consecutive times without  medical reason.  If treatment goals not met, if there is a change in medical status, if patient makes no progress towards goals or if patient is discharged from hospital.  The above assessment, treatment plan, treatment alternatives and goals were discussed and mutually agreed upon: by patient  DISCHARGE :  Pt has made excellent progress during LOS and is ready for discharge home with family to provide 24 hour supervision.  Pt participated in community reintegration at overall contact guard/min assist ambulatory level.  Goals met. [place] 09/04/2016, 4:07 PM

## 2016-09-04 NOTE — Progress Notes (Signed)
[place] PHYSICAL MEDICINE & REHABILITATION     PROGRESS NOTE  Subjective/Complaints:  No issues overnite per pt, remembers "Dr Raliegh Ip" ROS: pt denies nausea, vomiting, diarrhea, cough, shortness of breath or chest pain  Objective: Vital Signs: Blood pressure 130/62, pulse 70, temperature 98.1 F (36.7 C), temperature source Oral, resp. rate 17, height 5' 4"  (1.626 m), weight 67.3 kg (148 lb 5.9 oz), SpO2 96 %. No results found. No results for input(s): WBC, HGB, HCT, PLT in the last 72 hours.  Recent Labs  09/04/16 0548  NA 136  K 3.8  CL 103  GLUCOSE 113*  BUN 22*  CREATININE 0.87  CALCIUM 9.2   CBG (last 3)  No results for input(s): GLUCAP in the last 72 hours.  Wt Readings from Last 3 Encounters:  09/03/16 67.3 kg (148 lb 5.9 oz)  08/20/16 67.2 kg (148 lb 1.6 oz)    Physical Exam:  BP 130/62 (BP Location: Right Arm)   Pulse 70   Temp 98.1 F (36.7 C) (Oral)   Resp 17   Ht 5' 4"  (1.626 m)   Wt 67.3 kg (148 lb 5.9 oz)   SpO2 96%   BMI 25.47 kg/m  Constitutional: She appears well-developedand well-nourished.  HENT: Normocephalicand atraumatic.  Eyes: EOMI. No discharge.  Cardiovascular: RRR. No JVD.  Respiratory: Effort normal. Clear GI: Soft. Bowel sounds are normal.   Musculoskeletal: No tenderness. +Edema.  No pain with R shoulder ROM Neurological: She is alert.   Right facial weakness Speech without dysarthria.  Oriented x3. Responses delayed Motor: RUE: 2-3/5 proximal to distal,. RLE 2+ to 3/5 RLE: 1/5 proximal to distal (unchanged) Skin: Skin is warmand dry. Intact.   Assessment/Plan: 1. Functional deficits secondary to Left paramedian pontine infarct which require 3+ hours per day of interdisciplinary therapy in a comprehensive inpatient rehab setting. Physiatrist is providing close team supervision and 24 hour management of active medical problems listed below. Physiatrist and rehab team continue to assess barriers to discharge/monitor patient  progress toward functional and medical goals.  Function:  Bathing Bathing position   Position: Shower  Bathing parts Body parts bathed by patient: Front perineal area, Buttocks Body parts bathed by helper: Right lower leg, Left lower leg, Back  Bathing assist Assist Level: Set up (During toileting)      Upper Body Dressing/Undressing Upper body dressing   What is the patient wearing?: Pull over shirt/dress Bra - Perfomed by patient: Thread/unthread right bra strap, Thread/unthread left bra strap Bra - Perfomed by helper: Hook/unhook bra (pull down sports bra) Pull over shirt/dress - Perfomed by patient: Thread/unthread right sleeve, Thread/unthread left sleeve, Pull shirt over trunk, Put head through opening Pull over shirt/dress - Perfomed by helper: Put head through opening        Upper body assist Assist Level: Set up      Lower Body Dressing/Undressing Lower body dressing   What is the patient wearing?: Pants, Socks, Shoes     Pants- Performed by patient: Thread/unthread right pants leg, Thread/unthread left pants leg, Pull pants up/down Pants- Performed by helper: Pull pants up/down   Non-skid slipper socks- Performed by helper: Don/doff right sock, Don/doff left sock Socks - Performed by patient: Don/doff right sock, Don/doff left sock Socks - Performed by helper: Don/doff right sock, Don/doff left sock Shoes - Performed by patient: Don/doff right shoe, Don/doff left shoe, Fasten left, Fasten right Shoes - Performed by helper: Fasten right          Lower body  assist Assist for lower body dressing: Touching or steadying assistance (Pt > 75%)      Toileting Toileting   Toileting steps completed by patient: Adjust clothing prior to toileting, Performs perineal hygiene, Adjust clothing after toileting Toileting steps completed by helper: Performs perineal hygiene Toileting Assistive Devices: Grab bar or rail (Stedy)  Toileting assist Assist level: Touching or  steadying assistance (Pt.75%)   Transfers Chair/bed transfer   Chair/bed transfer method: Stand pivot Chair/bed transfer assist level: Supervision or verbal cues Chair/bed transfer assistive device: Armrests, Walker, Orthosis Mechanical lift: Ecologist     Max distance: 75 Assist level: Supervision or verbal cues   Wheelchair   Type: Manual Max wheelchair distance: 161 Assist Level: Supervision or verbal cues  Cognition Comprehension Comprehension assist level: Follows basic conversation/direction with no assist  Expression Expression assist level: Expresses complex 90% of the time/cues < 10% of the time  Social Interaction Social Interaction assist level: Interacts appropriately with others with medication or extra time (anti-anxiety, antidepressant).  Problem Solving Problem solving assist level: Solves basic problems with no assist  Memory Memory assist level: Recognizes or recalls 90% of the time/requires cueing < 10% of the time     Medical Problem List and Plan: 1. Right hemiparesis secondary to Left paramedian pontine infarct  Cont CIR PT, OT, Team conference today please see physician documentation under team conference tab, met with team face-to-face to discuss problems,progress, and goals. Formulized individual treatment plan based on medical history, underlying problem and comorbidities.  Bracing ordered  Fluoxetine started 1/14 2. DVT Prophylaxis/Anticoagulation: Pharmaceutical: Lovenox 3. Chronic back pain/spasms/Pain Management: Continue flexeril prn 4. Mood: Stable. LCSW to follow for evaluation and support.  5. Neuropsych: This patient is notcapable of making decisions on herown behalf, controlled at present 6. Skin/Wound Care: Routine pressure relief measures. Maintain adequate nutrition and hydration status.  7. Fluids/Electrolytes/Nutrition: Monitor I/O  Diet advanced to regular.  8. HTN: Monitor BP bid  Continue lisinopril    Norvasc 45m  -well controlled Vitals:   09/03/16 2000 09/04/16 0359  BP:  130/62  Pulse:  70  Resp: 18 17  Temp:  98.1 F (36.7 C)   9. 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  10. H/o asthma/Interstitial lung disease: Monitor respiratory status with increase in activity. albuterol MDI prn 11 Chronic systolic CHF: On ASA, metoprolol and zocor. Monitor for signs of overload. Heart healthy/Low salt restrictions.   -weights holding at 67Kg    Filed Weights   09/01/16 0504 09/02/16 0407 09/03/16 0500  Weight: 67 kg (147 lb 11.3 oz) 67.7 kg (149 lb 4 oz) 67.3 kg (148 lb 5.9 oz)   12. Chronic constipation: Resumed miralax. Last BM 1/24, continent 13. GERD: Wth chronic nausea--continue pepcid for now.doing well with this currently    14. Hypokalemia- resplved  K+ 4.4 on 1/15, recheck BMET 3.8 on 09/04/16 off KCL  17. Sleep disturbance  Trazodone PRN  Improved  LOS (Days) 13 A FACE TO FACE EVALUATION WAS PERFORMED  KAlysia PennaE 09/04/2016 7:50 AM

## 2016-09-04 NOTE — Patient Care Conference (Signed)
Inpatient RehabilitationTeam Conference and Plan of Care Update Date: 09/04/2016   Time: 11:30 AM    Patient Name: Yvonne Parrish      Medical Record Number: [id number]  Date of Birth: 1928/11/29 Sex: Female         Room/Bed: 4W23C/4W23C-01 Payor Info: Payor: [employer] MEDICARE / Plan: [employer] MEDICARE / Product Type: *No Product type* /    Admitting Diagnosis: CVA  Admit Date/Time:  08/22/2016  6:22 PM Admission Comments: No comment available   Primary Diagnosis:  <principal problem not specified> Principal Problem: <principal problem not specified>  Patient Active Problem List   Diagnosis Date Noted  . Benign essential HTN   . Chronic systolic CHF (congestive heart failure) ([place])   . Acute blood loss anemia   . Hypokalemia   . Sleep disturbance   . Gait disturbance, post-stroke 08/22/2016  . Left pontine stroke ([place]) 08/22/2016  . Slurred speech   . Chronic obstructive pulmonary disease ([place])   . Coronary artery disease involving coronary bypass graft of native heart without angina pectoris   . Leukocytosis   . Hyperglycemia   . Right hemiparesis ([place])   . COPD exacerbation ([place]) 08/20/2016  . Aphasia 08/20/2016  . Asthma without status asthmaticus 08/20/2016  . CHF (congestive heart failure) ([place]) 08/20/2016  . Hyperlipidemia, unspecified 08/20/2016  . Hypertension 08/20/2016  . Myelodysplastic syndrome ([place]) 08/20/2016  . Osteoporosis, post-menopausal 08/20/2016  . Stroke (cerebrum) ([place]) 08/20/2016  . Ischemic stroke ([place])   . Cardiomyopathy, dilated ([place]) 01/10/2016  . H/O cardiac catheterization 01/10/2016  . Chronic back pain 11/01/2014  . History of coronary artery bypass graft 11/01/2014  . Acquired hypothyroidism 07/05/2014    Expected Discharge Date: Expected Discharge Date: 09/07/16  Team Members Present: Physician leading conference: Dr. Alysia Penna Social Worker Present: Ovidio Kin, LCSW Nurse Present: Heather Roberts, RN PT Present: Jorge Mandril, PT;Caitlin Penven-Crew, PT OT Present: Simonne Come, Artemio Aly, OT SLP Present: Weston Anna, SLP PPS Coordinator present : Daiva Nakayama, RN, CRRN     Current Status/Progress Goal Weekly Team Focus  Medical   slow responses but oriented, BPs still fluctuating  stabilize BP  adjust meds   Bowel/Bladder   Continent of bowel and bladder, LBM 09/03/2016 tonight at 2030  continue to be continent of Bowel & bladder  Continue to monitor and assist as needed. Pt uses call light and transfers comfortably with Stedy.   Swallow/Nutrition/ Hydration   Regular textures with thin liquids   mod I   Goal Met   ADL's   min assist bathing, min-mod assist sit > stand, min assist dressing, mod assist squat pivot transfers  Upgraded to supervision overall, except Min assist toileting and transfers  ADL retraining, transfers, RUE NMR, family education   Mobility   min guard/supervision   supevision overall, min on stairs and in community  balance, activity tolerance, strengthening, d/c planning   Communication   Supervision-Mod I   supervision   Education and carryover of compensatory strategies    Safety/Cognition/ Behavioral Observations  at baseline          Pain   Patient has not complained of pain tonight, but willingly took tylenol at hs to prevent discomfort at bedtime.  pain scale less than 4  Continue to assess and treat as needed.   Skin   Pt has not breakdown, slight redness but denies tenderness to perineal area.  Redness blanchable and nontender.  no new skin breakdown and complete healing of redness  to buttocks.  Assess every shift, increased mobility and activity      *See Care Plan and progress notes for long and short-term goals.  Barriers to Discharge: Fatigue, limited activity tolerance    Possible Resolutions to Barriers:  cont rehab to slowly build endurance    Discharge Planning/Teaching Needs:  Daughter's working on plan for 24 hr care-very pleased with  Mom's progress and upgrading goals.      Team Discussion:  Progressing more quickly toward her goals will reach by Sat 1/27. Upgraded goals to supervision level. MD adjusting meds. Ordered AFO for right foot drop from prior injury. Will need family education prior to discharge. Work with daughter's on discharge 1/27  Revisions to Treatment Plan:  Upgraded goals to supervision level   Continued Need for Acute Rehabilitation Level of Care: The patient requires daily medical management by a physician with specialized training in physical medicine and rehabilitation for the following conditions: Daily direction of a multidisciplinary physical rehabilitation program to ensure safe treatment while eliciting the highest outcome that is of practical value to the patient.: Yes Daily medical management of patient stability for increased activity during participation in an intensive rehabilitation regime.: Yes Daily analysis of laboratory values and/or radiology reports with any subsequent need for medication adjustment of medical intervention for : Neurological problems;Blood pressure problems  Roan Miklos, [name] 09/05/2016, 2:25 PM

## 2016-09-04 NOTE — Progress Notes (Signed)
Occupational Therapy Session Note  Patient Details  Name: Yvonne Parrish MRN: [id number] Date of Birth: [birth year]  Today's Date: 09/04/2016 OT Individual Time: 1004-1100 and [id number] OT Individual Time Calculation (min): 56 min and 27 min   Short Term Goals: Week 2:  OT Short Term Goal 1 (Week 2): Pt will transfer to toilet/ BSC with mod A  OT Short Term Goal 2 (Week 2): Pt will don pants at sit > stand level with min assist OT Short Term Goal 3 (Week 2): Pt will complete UB dressing with supervision OT Short Term Goal 4 (Week 2): Pt will utilize RUE at diminished level during functional task of self-feeding or dressing  Skilled Therapeutic Interventions/Progress Updates:   1) Treatment session with focus on activity tolerance, standing balance, and functional use of RUE.  Engaged in table top activity with focus on functional use of RUE with reaching across midline, in hand manipulation, 3 jaw chuck, and sequencing.  Pt able to complete visual spatial activity with copying picture with pegs and resistive pegboard.  Pt demonstrated improved grasp and motor control with peg activity in sitting and standing.  Completed jigsaw puzzle in standing with pt maintaining standing balance 12 mins while completing task with RUE.  Engaged in discussion regarding progress towards goals as well as progress towards upgraded goals.  Pt expressing pleasure with progress towards goals and desire to d/c home when therapy team feeling she will be safe.  Discussed possible change of d/c date as pt quickly achieving even upgraded goals.  2) Treatment session with focus on RUE NMR and strengthening.  Provided pt with tan theraputty to address strengthening of Rt hand - grasp and digits.  Pt had arthritis therefore selected lesser resistance to allow increased use.  Pt able to return complete each exercise with focus on RUE then bimanual tasks.  Provided pt with handout to increase carryover and use outside of therapy  sessions.  Therapy Documentation Precautions:  Precautions Precautions: Fall Precaution Comments: chronic right foot drop Restrictions Weight Bearing Restrictions: No Pain:  Pt with no c/o pain  See Function Navigator for Current Functional Status.   Therapy/Group: Individual Therapy  Simonne Come 09/04/2016, 12:48 PM

## 2016-09-04 NOTE — Progress Notes (Signed)
Social Work Patient ID: Yvonne Parrish, female   DOB: 07/13/1929, 83 y.o.   MRN: [id number]  Met with pt and spoke with daughter-Deborah via telephone to discuss team conference her progress toward her goals and moving up Discharge date to 1/27. Pt is pleased with this and daughter voiced she is somewhat surprised but will work with her sister ion the 24 hr supervision plan. Agreeable to home health follow up and daughter aware will need To come back to go through family education prior to discharge. Will work on discharge Sat.

## 2016-09-04 NOTE — Plan of Care (Signed)
Problem: RH KNOWLEDGE DEFICIT Goal: RH STG INCREASE KNOWLEDGE OF HYPERTENSION Patient and patient's daughter with increase understanding of s/s hypo/hypertension, demonstrate understanding of medications, treatments, and diet prescribed by MD with handout 90% of the time.  Outcome: Progressing Goal is to continue to see pt express understanding of her care, medications, and assistive devices and to speak up and be proactive in her needs.

## 2016-09-04 NOTE — Plan of Care (Signed)
Problem: RH BOWEL ELIMINATION Goal: RH STG MANAGE BOWEL WITH ASSISTANCE STG Manage Bowel with mod Assistance.   Outcome: Progressing Pt will communicate need to go to the bathroom and will require minimal assistance to go. Pt will clean perineal area independently and adjust clothing independently.

## 2016-09-04 NOTE — Progress Notes (Signed)
Patient's BP was 103/40 and pulse was 73 bpm at approximately 0800. MD Kirsteins notified while he was on the unit, verbal order given to only give the Metoprolol 50 mg d/t low BP and to hold the Norvasc 5 mg as well as the Lisinopril 4 mg. No acute distress at this time, will continue to monitor. IPP

## 2016-09-04 NOTE — Progress Notes (Signed)
Physical Therapy Session Note  Patient Details  Name: Yvonne Parrish MRN: [id number] Date of Birth: 1929/04/18  Today's Date: 09/04/2016 PT Individual Time: 0920-0945 PT Individual Time Calculation (min): 25 min   Skilled Therapeutic Interventions/Progress Updates:  Pt denies pain.  Treatment today focused on functional activities to promote independence for hopeful D/C home.  Pt performed sit to stand with CGA.  She additionally walked 50 ft x 1, and 100 ft x 2 with rolling walker.  Requires cues to keep feet in walker.  Pt occasionally catches right toes with step through, however, does well with utilization of external cues (kicking foot towards wheels).  Requires rest breaks with ambulation due to fatigue.  Sp02 97%.  Pt left in room with needs met and call bell in reach.     Therapy Documentation Precautions:  Precautions Precautions: Fall Precaution Comments: chronic right foot drop Restrictions Weight Bearing Restrictions: No     See Function Navigator for Current Functional Status.   Therapy/Group: Individual Therapy  Jaleesa Cervi Hilario Quarry 09/04/2016, 11:14 AM

## 2016-09-04 NOTE — Plan of Care (Signed)
Problem: RH BOWEL ELIMINATION Goal: RH STG MANAGE BOWEL W/MEDICATION W/ASSISTANCE STG Manage Bowel with Medication with min Assistance.   Outcome: Progressing Pt's stools have been soft, large and slightly formed. No problems, pt states. Goal to continue to have healthy bowel habits/patterns.

## 2016-09-05 ENCOUNTER — Inpatient Hospital Stay (HOSPITAL_COMMUNITY): Payer: Medicare Other | Admitting: Physical Therapy

## 2016-09-05 ENCOUNTER — Inpatient Hospital Stay (HOSPITAL_COMMUNITY): Payer: Medicare Other | Admitting: Occupational Therapy

## 2016-09-05 ENCOUNTER — Encounter (HOSPITAL_COMMUNITY): Payer: Medicare Other | Admitting: *Deleted

## 2016-09-05 ENCOUNTER — Inpatient Hospital Stay (HOSPITAL_COMMUNITY): Payer: Medicare Other | Admitting: Speech Pathology

## 2016-09-05 LAB — BASIC METABOLIC PANEL
Anion gap: 8 (ref 5–15)
BUN: 16 mg/dL (ref 6–20)
CO2: 24 mmol/L (ref 22–32)
Calcium: 9.2 mg/dL (ref 8.9–10.3)
Chloride: 104 mmol/L (ref 101–111)
Creatinine, Ser: 0.83 mg/dL (ref 0.44–1.00)
GFR calc Af Amer: >60 mL/min (ref >60)
GFR calc non Af Amer: >60 mL/min (ref >60)
Glucose, Bld: 116 mg/dL — ABNORMAL HIGH (ref 65–99)
Potassium: 3.7 mmol/L (ref 3.5–5.1)
Sodium: 136 mmol/L (ref 135–145)

## 2016-09-05 LAB — CBC
HCT: 32 % — ABNORMAL LOW (ref 36.0–46.0)
Hemoglobin: 10.5 g/dL — ABNORMAL LOW (ref 12.0–15.0)
MCH: 32.5 pg (ref 26.0–34.0)
MCHC: 32.8 g/dL (ref 30.0–36.0)
MCV: 99.1 fL (ref 78.0–100.0)
Platelets: 252 10*3/uL (ref 150–400)
RBC: 3.23 MIL/uL — ABNORMAL LOW (ref 3.87–5.11)
RDW: 11.9 % (ref 11.5–15.5)
WBC: 7 10*3/uL (ref 4.0–10.5)

## 2016-09-05 NOTE — Progress Notes (Signed)
Physical Therapy Session Note  Patient Details  Name: Yvonne Parrish MRN: [id number] Date of Birth: 23-Sep-1928  Today's Date: 09/05/2016 PT Individual Time: 1300-1330 PT Individual Time Calculation (min): 30 min   Short Term Goals: Week 2:  PT Short Term Goal 1 (Week 2): =LTGs  Skilled Therapeutic Interventions/Progress Updates:    No c/o pain agreeable to therapy session focus on LE strengthening, ambulation, and transfers.    Pt transfers with rollator with supervision and min verbal cues for safety.  Gait to and from therapy gym with distant supervision using rollator.  PT instructed pt in LE strengthening 8x sit<>stand with reduced UE support.  Pt required extended rest break prior to returning to room.  Positioned in w/c with call bell in reach and needs met.   Therapy Documentation Precautions:  Precautions Precautions: Fall Precaution Comments: chronic right foot drop Restrictions Weight Bearing Restrictions: No   See Function Navigator for Current Functional Status.   Therapy/Group: Individual Therapy  Earnest Conroy Penven-Crew 09/05/2016, 1:33 PM

## 2016-09-05 NOTE — Progress Notes (Signed)
Physical Therapy Session Note  Patient Details  Name: ANIAS CUTCHIN MRN: [id number] Date of Birth: Mar 15, 1929  Today's Date: 09/05/2016 PT Individual Time: 1400-1600 PT Time Calculation: 120 min   Short Term Goals: Week 2:  PT Short Term Goal 1 (Week 2): =LTGs Week 3:     Skilled Therapeutic Interventions/Progress Updates:    Pt participated in community outing to [employer] with focus on community mobility with w/c and RW, emergent and anticpatory awareness, and problem solving at overall min assist level.  See shadow chart for details.   Therapy Documentation Precautions:  Precautions Precautions: Fall Precaution Comments: chronic right foot drop Restrictions Weight Bearing Restrictions: No   See Function Navigator for Current Functional Status.   Therapy/Group: Individual Therapy  Maryellen Dowdle E Penven-Crew 09/05/2016, 8:50 AM

## 2016-09-05 NOTE — Progress Notes (Signed)
Physical Therapy Session Note  Patient Details  Name: Yvonne Parrish MRN: [id number] Date of Birth: 1928/09/09  Today's Date: 09/05/2016 PT Individual Time: 0900-1000 PT Individual Time Calculation (min): 60 min   Short Term Goals: Week 2:  PT Short Term Goal 1 (Week 2): =LTGs  Skilled Therapeutic Interventions/Progress Updates:    No c/o pain.  Session focus on dynamic sitting and standing balance during dressing and self care tasks, gait training with rollator, and activity tolerance.   Pt maneuvered w/c throughout room mod I to obtain clothing from drawers.  Dressing from w/c level with sit<>stand for LB dressing with overall supervision for dynamic standing balance, mod I for dynamic sitting balance.  Pt ambulates to/from bathroom with HHA to simulate home bathroom access, toileting with supervision for clothing management and hygiene with use of grab bar for balance.  Pt ambulates room<>rehab apartment with 1 short seated rest break with rollator and supervision.  PT discussed rollator safety including locking brakes when transferring and bracing rollator against a wall when sitting for a rest break. Pt returned to room at end of session and positioned upright in w/c with call bell in reach and needs met.   Therapy Documentation Precautions:  Precautions Precautions: Fall Precaution Comments: chronic right foot drop Restrictions Weight Bearing Restrictions: No   See Function Navigator for Current Functional Status.   Therapy/Group: Individual Therapy  Ramzy Cappelletti E Penven-Crew 09/05/2016, 11:02 AM

## 2016-09-05 NOTE — Progress Notes (Signed)
[facility] PHYSICAL MEDICINE & REHABILITATION     PROGRESS NOTE  Subjective/Complaints:  Pt seen sitting up in her chair this AM.  She slept well overnight.  She is looking forward to being discharged on Saturday.   ROS: Denies nausea, vomiting, diarrhea, shortness of breath or chest pain  Objective: Vital Signs: Blood pressure (!) 124/44, pulse 72, temperature 97.9 F (36.6 C), temperature source Oral, resp. rate 18, height 5\' 4"  (1.626 m), weight 66.6 kg (146 lb 13.2 oz), SpO2 97 %. No results found.  Recent Labs  09/05/16 0547  WBC 7.0  HGB 10.5*  HCT 32.0*  PLT 252    Recent Labs  09/04/16 0548 09/05/16 0547  NA 136 136  K 3.8 3.7  CL 103 104  GLUCOSE 113* 116*  BUN 22* 16  CREATININE 0.87 0.83  CALCIUM 9.2 9.2   CBG (last 3)  No results for input(s): GLUCAP in the last 72 hours.  Wt Readings from Last 3 Encounters:  09/05/16 66.6 kg (146 lb 13.2 oz)  08/20/16 67.2 kg (148 lb 1.6 oz)    Physical Exam:  BP (!) 124/44 (BP Location: Left Arm)   Pulse 72   Temp 97.9 F (36.6 C) (Oral)   Resp 18   Ht 5\' 4"  (1.626 m)   Wt 66.6 kg (146 lb 13.2 oz)   SpO2 97%   BMI 25.20 kg/m  Constitutional: She appears well-developedand well-nourished.  HENT: Normocephalicand atraumatic.  Eyes: EOMI. No discharge.  Cardiovascular: RRR. No JVD.  Respiratory: Effort normal. Clear GI: Soft. Bowel sounds are normal.   Musculoskeletal: No tenderness. +Edema.   Neurological: She is alert.   Right facial weakness Speech without dysarthria.  Oriented x3.  Motor: RUE: 4/5 proximal to distal LUE: 4+/5 proximal to distal Skin: Skin is warmand dry. Intact.   Assessment/Plan: 1. Functional deficits secondary to Left paramedian pontine infarct which require 3+ hours per day of interdisciplinary therapy in a comprehensive inpatient rehab setting. Physiatrist is providing close team supervision and 24 hour management of active medical problems listed below. Physiatrist and  rehab team continue to assess barriers to discharge/monitor patient progress toward functional and medical goals.  Function:  Bathing Bathing position   Position: Shower  Bathing parts Body parts bathed by patient: Front perineal area, Buttocks Body parts bathed by helper: Right lower leg, Left lower leg, Back  Bathing assist Assist Level: Set up (During toileting)      Upper Body Dressing/Undressing Upper body dressing   What is the patient wearing?: Pull over shirt/dress Bra - Perfomed by patient: Thread/unthread right bra strap, Thread/unthread left bra strap Bra - Perfomed by helper: Hook/unhook bra (pull down sports bra) Pull over shirt/dress - Perfomed by patient: Thread/unthread right sleeve, Thread/unthread left sleeve, Pull shirt over trunk, Put head through opening Pull over shirt/dress - Perfomed by helper: Put head through opening        Upper body assist Assist Level: More than reasonable time      Lower Body Dressing/Undressing Lower body dressing   What is the patient wearing?: Pants, Socks, Shoes, AFO     Pants- Performed by patient: Thread/unthread right pants leg, Thread/unthread left pants leg, Pull pants up/down Pants- Performed by helper: Pull pants up/down   Non-skid slipper socks- Performed by helper: Don/doff right sock, Don/doff left sock Socks - Performed by patient: Don/doff right sock, Don/doff left sock Socks - Performed by helper: Don/doff right sock, Don/doff left sock Shoes - Performed by patient: Don/doff right  shoe, Don/doff left shoe, Fasten right, Fasten left Shoes - Performed by helper: Fasten right AFO - Performed by patient: Don/doff right AFO        Lower body assist Assist for lower body dressing: Supervision or verbal cues      Toileting Toileting   Toileting steps completed by patient: Adjust clothing prior to toileting, Performs perineal hygiene, Adjust clothing after toileting Toileting steps completed by helper: Performs  perineal hygiene Toileting Assistive Devices: Grab bar or rail  Toileting assist Assist level: Supervision or verbal cues   Transfers Chair/bed transfer   Chair/bed transfer method: Ambulatory Chair/bed transfer assist level: Supervision or verbal cues Chair/bed transfer assistive device: Armrests, Walker, Orthosis Mechanical lift: Ecologist     Max distance: 200 Assist level: Supervision or verbal cues   Wheelchair   Type: Manual Max wheelchair distance: 161 Assist Level: Supervision or verbal cues  Cognition Comprehension Comprehension assist level: Follows basic conversation/direction with no assist  Expression Expression assist level: Expresses complex 90% of the time/cues < 10% of the time  Social Interaction Social Interaction assist level: Interacts appropriately with others with medication or extra time (anti-anxiety, antidepressant).  Problem Solving Problem solving assist level: Solves basic problems with no assist  Memory Memory assist level: Recognizes or recalls 90% of the time/requires cueing < 10% of the time     Medical Problem List and Plan: 1. 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 DVT Prophylaxis/Anticoagulation: Pharmaceutical: Lovenox 3. Chronic back pain/spasms/Pain Management: Continue flexeril prn 4. Mood: Stable. LCSW to follow for evaluation and support.  5. Neuropsych: This patient is notcapable of making decisions on herown behalf, controlled at present 6. Skin/Wound Care: Routine pressure relief measures. Maintain adequate nutrition and hydration status.  7. Fluids/Electrolytes/Nutrition: Monitor I/O  Diet advanced to regular.  8. HTN: Monitor BP bid  Continue lisinopril   Norvasc 5mg   -Controlled 1/25 Vitals:   09/05/16 0811 09/05/16 1441  BP: (!) 135/54 (!) 124/44  Pulse: 69 72  Resp:  18  Temp:  97.9 F (36.6 C)   9. CAD s/p CABG  '99: On metoprolol --ASA changed to aggrenox.  10. H/o asthma/Interstitial lung disease: Monitor respiratory status with increase in activity. albuterol MDI prn 11 Chronic systolic CHF: On ASA, metoprolol and zocor. Monitor for signs of overload. Heart healthy/Low salt restrictions.   -weights holding   Filed Weights   09/02/16 0407 09/03/16 0500 09/05/16 0500  Weight: 67.7 kg (149 lb 4 oz) 67.3 kg (148 lb 5.9 oz) 66.6 kg (146 lb 13.2 oz)   12. Chronic constipation: Resumed miralax. Continent 13. GERD: Wth chronic nausea--continue pepcid for now.doing well with this currently 14. Hypokalemia- resplved  K+ 3.7 on 09/05/16 off KCL 17. Sleep disturbance  Trazodone PRN  Improved 18. ABLA  Hb 10.5 on 1/25  Cont to monitor  Hemocult ordered  Labs ordered for tomorrow  LOS (Days) 14 A FACE TO FACE EVALUATION WAS PERFORMED  Yvonne Parrish 09/05/2016 3:09 PM

## 2016-09-05 NOTE — Progress Notes (Signed)
Occupational Therapy Session Note  Patient Details  Name: Yvonne Parrish MRN: [id number] Date of Birth: 1928/09/21  Today's Date: 09/05/2016 OT Individual Time: [id number] OT Individual Time Calculation (min): 69 min    Short Term Goals: Week 2:  OT Short Term Goal 1 (Week 2): Pt will transfer to toilet/ BSC with mod A  OT Short Term Goal 2 (Week 2): Pt will don pants at sit > stand level with min assist OT Short Term Goal 3 (Week 2): Pt will complete UB dressing with supervision OT Short Term Goal 4 (Week 2): Pt will utilize RUE at diminished level during functional task of self-feeding or dressing  Skilled Therapeutic Interventions/Progress Updates:    Treatment session with focus on LB dressing, functional use of RUE, and functional mobility.  Pt received in w/c already dressed, however willing to complete LB dressing to demonstrate progress and engage in conversation regarding goal level of supervision.  Pt even able to tie shoe laces this session, therefore removed shoe buttons and encouraged pt to tie shoes to continue to increase functional recovery.  Ambulated to Dayroom with Rollator and supervision.  Engaged in seated and standing table top activities with focus on RUE fine and gross motor control.  Completed 9 hole peg test Rt: 1:07 and Lt: 39 seconds.  Pt demonstrating overall slower movements with RUE, but no ataxia or dropping of items.  Pt reports decreased control and grasp intermittently during task.  Engaged in fastening buttons and in-hand manipulation and translation with focus on fine motor control in functional tasks.  Pt returned to room as above and left seated upright in w/c.  Selected additional exercises on previously provided fine motor control HEP handout, to which pt verbalized understanding of each exercise.  Therapy Documentation Precautions:  Precautions Precautions: Fall Precaution Comments: chronic right foot drop Restrictions Weight Bearing Restrictions:  No Pain:  Pt with no c/o pain  See Function Navigator for Current Functional Status.   Therapy/Group: Individual Therapy  Simonne Come 09/05/2016, 12:16 PM

## 2016-09-05 NOTE — Progress Notes (Signed)
Speech Language Pathology Daily Session Note  Patient Details  Name: AVERI LIESCH MRN: [id number] Date of Birth: [birth year]  Today's Date: 09/05/2016 SLP Individual Time: 1430-1500 SLP Individual Time Calculation (min): 30 min  Short Term Goals: Week 2: SLP Short Term Goal 1 (Week 2): Pt will utilize speech intelligibility strategies at the conversational level with mod I in a distracting environment to achieve >90% intelligibility.  SLP Short Term Goal 2 (Week 2): Pt will demonstrate self-monitoring and correction of speech intelligibility at the conversational level with mod I.  SLP Short Term Goal 3 (Week 2): Pt will utilize word-finding strategies during structured language activies with mod I.  SLP Short Term Goal 4 (Week 2): Pt will consume meds whole with liquids with mod I use of swallowing precautions x1 pior to advancement.    Skilled Therapeutic Interventions: Skilled treatment session focused on speech goals. Patient was Mod I for speech intelligibility and word-finding at the conversation level in an extremely loud environment during a structured verbal task. Patient transferred back to bed at end of session. Continue with current plan of care.      Function:  Cognition Comprehension Comprehension assist level: Follows basic conversation/direction with no assist  Expression   Expression assist level: Expresses complex 90% of the time/cues < 10% of the time  Social Interaction Social Interaction assist level: Interacts appropriately with others with medication or extra time (anti-anxiety, antidepressant).  Problem Solving Problem solving assist level: Solves basic problems with no assist  Memory Memory assist level: Recognizes or recalls 90% of the time/requires cueing < 10% of the time    Pain Pain Assessment Pain Score: 3   Therapy/Group: Individual Therapy  Vonn Sliger 09/05/2016, 3:49 PM

## 2016-09-06 ENCOUNTER — Inpatient Hospital Stay (HOSPITAL_COMMUNITY): Payer: Medicare Other | Admitting: Physical Therapy

## 2016-09-06 ENCOUNTER — Inpatient Hospital Stay (HOSPITAL_COMMUNITY): Payer: Medicare Other | Admitting: Occupational Therapy

## 2016-09-06 ENCOUNTER — Inpatient Hospital Stay (HOSPITAL_COMMUNITY): Payer: Medicare Other

## 2016-09-06 MED ORDER — ATORVASTATIN CALCIUM 20 MG PO TABS: 20.0000 mg | ORAL_TABLET | Freq: Every day | ORAL | 0 refills | Status: DC

## 2016-09-06 MED ORDER — CYCLOBENZAPRINE HCL 10 MG PO TABS: 5.0000 mg | ORAL_TABLET | Freq: Three times a day (TID) | ORAL | 0 refills | Status: DC | PRN

## 2016-09-06 MED ORDER — ASPIRIN-DIPYRIDAMOLE ER 25-200 MG PO CP12: 1.0000 | ORAL_CAPSULE | Freq: Two times a day (BID) | ORAL | 0 refills | Status: DC

## 2016-09-06 MED ORDER — FLUOXETINE HCL 10 MG PO CAPS: 10.0000 mg | ORAL_CAPSULE | Freq: Every day | ORAL | 0 refills | Status: DC

## 2016-09-06 MED ORDER — MUSCLE RUB 10-15 % EX CREA: 1.0000 "application " | TOPICAL_CREAM | Freq: Two times a day (BID) | CUTANEOUS | 0 refills | Status: DC

## 2016-09-06 MED ORDER — LISINOPRIL 40 MG PO TABS: 40.0000 mg | ORAL_TABLET | Freq: Every day | ORAL | 0 refills | Status: DC

## 2016-09-06 MED ORDER — METOPROLOL SUCCINATE ER 50 MG PO TB24: 50.0000 mg | ORAL_TABLET | Freq: Every day | ORAL | 0 refills | Status: DC

## 2016-09-06 MED ORDER — AMLODIPINE BESYLATE 5 MG PO TABS: 5.0000 mg | ORAL_TABLET | Freq: Every day | ORAL | 0 refills | Status: DC

## 2016-09-06 MED ORDER — FAMOTIDINE 20 MG PO TABS: 20.0000 mg | ORAL_TABLET | Freq: Every day | ORAL | 0 refills | Status: DC

## 2016-09-06 NOTE — Progress Notes (Signed)
Speech Language Pathology Discharge Summary  Patient Details  Name: SANAAI DOANE MRN: [id number] Date of Birth: 1929-04-22  Patient has met 3 of 3 long term goals.  Patient to discharge at overall Modified Independent level.   Reasons goals not met: N/A   Clinical Impression/Discharge Summary: Patient has made excellent gains and has met 3 of 3 LTG's this admission. Currently, patient is consuming regular textures with thin liquids without overt s/s of aspiration and is Mod I for use of swallowing compensatory strategies. Patient is also overall Mod I for use of speech intelligibility and word-finding strategies to achieve 100% intelligibility at the conversation and independently express her wants/needs. Patient and family education is complete and patient will discharge home with assistance from family. Suspect patient is at her baseline level of cognitive functioning, therefore, SLP f/u is not warranted at this time.   Care Partner:  Caregiver Able to Provide Assistance: Yes  Type of Caregiver Assistance: Physical  Recommendation:  24 hour supervision/assistance;None      Equipment: N/A   Reasons for discharge: Discharged from hospital;Treatment goals met   Patient/Family Agrees with Progress Made and Goals Achieved: Yes       Audrey Thull 09/06/2016, 7:05 AM

## 2016-09-06 NOTE — Progress Notes (Signed)
Social Work  Discharge Note  The overall goal for the admission was met for:   Discharge location: Yes-HOME WITH DAUGHTER'S ASSISTING WITH HER CARE-24 HR  Length of Stay: Yes-16 DAYS  Discharge activity level: Yes-SUPERVISION-SOME MIN ASSIST LEVEL  Home/community participation: Yes  Services provided included: MD, RD, PT, OT, SLP, RN, CM, TR, Pharmacy and SW  Financial Services: Private Insurance: [facility]  Follow-up services arranged: Home Health: [place], DME: [facility] and Patient/Family has no preference for HH/DME agencies  Comments (or additional information):DAUGHTER'S WERE HERE DAILY AND PARTICIPATED IN THERAPIES WITH PT AND COMFORTABLE WITH THE CARE SHE NEEDS. THEY WILL PROVIDE 24 HR CARE.  Patient/Family verbalized understanding of follow-up arrangements: Yes  Individual responsible for coordination of the follow-up plan: DEBBY & MARCEY-DAUGHTER'S  Confirmed correct DME delivered: [name] 09/06/2016    [name]

## 2016-09-06 NOTE — Progress Notes (Signed)
[facility] PHYSICAL MEDICINE & REHABILITATION     PROGRESS NOTE  Subjective/Complaints:  No issues overnite  ROS: Denies nausea, vomiting, diarrhea, shortness of breath or chest pain  Objective: Vital Signs: Blood pressure (!) 128/46, pulse 71, temperature 97.8 F (36.6 C), temperature source Oral, resp. rate 18, height 5\' 4"  (1.626 m), weight 67.4 kg (148 lb 9.4 oz), SpO2 94 %. No results found.  Recent Labs  09/05/16 0547  WBC 7.0  HGB 10.5*  HCT 32.0*  PLT 252    Recent Labs  09/04/16 0548 09/05/16 0547  NA 136 136  K 3.8 3.7  CL 103 104  GLUCOSE 113* 116*  BUN 22* 16  CREATININE 0.87 0.83  CALCIUM 9.2 9.2   CBG (last 3)  No results for input(s): GLUCAP in the last 72 hours.  Wt Readings from Last 3 Encounters:  09/06/16 67.4 kg (148 lb 9.4 oz)  08/20/16 67.2 kg (148 lb 1.6 oz)    Physical Exam:  BP (!) 128/46 (BP Location: Right Arm)   Pulse 71   Temp 97.8 F (36.6 C) (Oral)   Resp 18   Ht 5\' 4"  (1.626 m)   Wt 67.4 kg (148 lb 9.4 oz)   SpO2 94%   BMI 25.51 kg/m  Constitutional: She appears well-developedand well-nourished.  HENT: Normocephalicand atraumatic.  Eyes: EOMI. No discharge.  Cardiovascular: RRR. No JVD.  Respiratory: Effort normal. Clear GI: Soft. Bowel sounds are normal.   Musculoskeletal: No tenderness. +Edema.   Neurological: She is alert.   Right facial weakness Speech without dysarthria.  Oriented x3.  Motor: RUE: 4/5 proximal to distal LUE: 4+/5 proximal to distal Skin: Skin is warmand dry. Intact.   Assessment/Plan: 1. Functional deficits secondary to Left paramedian pontine infarct which require 3+ hours per day of interdisciplinary therapy in a comprehensive inpatient rehab setting. Physiatrist is providing close team supervision and 24 hour management of active medical problems listed below. Physiatrist and rehab team continue to assess barriers to discharge/monitor patient progress toward functional and medical  goals.  Function:  Bathing Bathing position   Position: Shower  Bathing parts Body parts bathed by patient: Front perineal area, Buttocks Body parts bathed by helper: Right lower leg, Left lower leg, Back  Bathing assist Assist Level: Set up (During toileting)      Upper Body Dressing/Undressing Upper body dressing   What is the patient wearing?: Pull over shirt/dress Bra - Perfomed by patient: Thread/unthread right bra strap, Thread/unthread left bra strap Bra - Perfomed by helper: Hook/unhook bra (pull down sports bra) Pull over shirt/dress - Perfomed by patient: Thread/unthread right sleeve, Thread/unthread left sleeve, Pull shirt over trunk, Put head through opening Pull over shirt/dress - Perfomed by helper: Put head through opening        Upper body assist Assist Level: More than reasonable time      Lower Body Dressing/Undressing Lower body dressing   What is the patient wearing?: Pants, Socks, Shoes, AFO     Pants- Performed by patient: Thread/unthread right pants leg, Thread/unthread left pants leg, Pull pants up/down Pants- Performed by helper: Pull pants up/down   Non-skid slipper socks- Performed by helper: Don/doff right sock, Don/doff left sock Socks - Performed by patient: Don/doff right sock, Don/doff left sock Socks - Performed by helper: Don/doff right sock, Don/doff left sock Shoes - Performed by patient: Don/doff right shoe, Don/doff left shoe, Fasten right, Fasten left Shoes - Performed by helper: Fasten right AFO - Performed by patient: Don/doff right  AFO        Lower body assist Assist for lower body dressing: Supervision or verbal cues      Toileting Toileting   Toileting steps completed by patient: Adjust clothing prior to toileting, Performs perineal hygiene, Adjust clothing after toileting Toileting steps completed by helper: Performs perineal hygiene Toileting Assistive Devices: Grab bar or rail  Toileting assist Assist level: Supervision  or verbal cues   Transfers Chair/bed transfer   Chair/bed transfer method: Ambulatory Chair/bed transfer assist level: Supervision or verbal cues Chair/bed transfer assistive device: Armrests, Walker, Orthosis Mechanical lift: Ecologist     Max distance: 200 Assist level: Supervision or verbal cues   Wheelchair   Type: Manual Max wheelchair distance: 161 Assist Level: Supervision or verbal cues  Cognition Comprehension Comprehension assist level: Follows basic conversation/direction with no assist  Expression Expression assist level: Expresses complex 90% of the time/cues < 10% of the time  Social Interaction Social Interaction assist level: Interacts appropriately with others with medication or extra time (anti-anxiety, antidepressant).  Problem Solving Problem solving assist level: Solves basic problems with no assist  Memory Memory assist level: Recognizes or recalls 90% of the time/requires cueing < 10% of the time     Medical Problem List and Plan: 1. Right hemiparesis secondary to Left paramedian pontine infarct  Cont PT, OT, SLP  Stable for D/C in am after MD rounds 2. DVT Prophylaxis/Anticoagulation: Pharmaceutical: Lovenox 3. Chronic back pain/spasms/Pain Management: Continue flexeril prn 4. Mood: Stable. LCSW to follow for evaluation and support.  5. Neuropsych: This patient is notcapable of making decisions on herown behalf, controlled at present 6. Skin/Wound Care: Routine pressure relief measures. Maintain adequate nutrition and hydration status.  7. Fluids/Electrolytes/Nutrition: Monitor I/O  Diet advanced to regular.  8. HTN: Monitor BP bid  Continue lisinopril   Norvasc 5mg   -Controlled 1/25 Vitals:   09/05/16 1441 09/06/16 0534  BP: (!) 124/44 (!) 128/46  Pulse: 72 71  Resp: 18 18  Temp: 97.9 F (36.6 C) 97.8 F (36.6 C)   9. 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  10. H/o asthma/Interstitial lung  disease: Monitor respiratory status with increase in activity. albuterol MDI prn 11 Chronic systolic CHF: On ASA, metoprolol and zocor. Monitor for signs of overload. Heart healthy/Low salt restrictions.   -weights holding   Filed Weights   09/05/16 0500 09/06/16 0200 09/06/16 0534  Weight: 66.6 kg (146 lb 13.2 oz) 66.9 kg (147 lb 7.8 oz) 67.4 kg (148 lb 9.4 oz)   12. Chronic constipation: Resumed miralax. Continent 13. GERD: Wth chronic nausea--continue pepcid for now.doing well with this currently 14. Hypokalemia- resplved  K+ 3.7 on 09/05/16 off KCL 17. Sleep disturbance  Trazodone PRN  Improved 18. ABLA  Hb 10.5 on 1/25 vs 11.7 on 1/18  Cont to monitor  Hemocult ordered   LOS (Days) 15 A FACE TO FACE EVALUATION WAS PERFORMED  Charlett Blake 09/06/2016 8:41 AM

## 2016-09-06 NOTE — Progress Notes (Signed)
Occupational Therapy Session Note  Patient Details  Name: Yvonne Parrish MRN: [id number] Date of Birth: 23-Oct-1928  Today's Date: 09/06/2016 OT Individual Time: 0900-1000 OT Individual Time Calculation (min): 60 min    Short Term Goals: Week 2:  OT Short Term Goal 1 (Week 2): Pt will transfer to toilet/ BSC with mod A  OT Short Term Goal 2 (Week 2): Pt will don pants at sit > stand level with min assist OT Short Term Goal 3 (Week 2): Pt will complete UB dressing with supervision OT Short Term Goal 4 (Week 2): Pt will utilize RUE at diminished level during functional task of self-feeding or dressing  Skilled Therapeutic Interventions/Progress Updates:    Pt received via handoff from PT.  Ambulated back to hospital room with Rollator and one seated rest break.  Completed bathing and dressing at overall Supervision level.  Pt completed transfers in/out of room shower with supervision, will require min assist at home due to setup.  Completed bathing and dressing at overall distant supervision at sit > stand level, even donning Rt AFO and tying shoe laces.  Pt required increased time throughout with rest breaks, however able to complete all tasks at overall supervision level demonstrating good use of RUE.  Pt reports she is pleased with progress.  Therapy Documentation Precautions:  Precautions Precautions: Fall Precaution Comments: decreased DME safety awareness, requires verbal cues Restrictions Weight Bearing Restrictions: No Pain: Pain Assessment Pain Assessment: No/denies pain Pain Score: 1  (premedication for therapy) ADL: ADL ADL Comments: see functional ambulation   See Function Navigator for Current Functional Status.   Therapy/Group: Individual Therapy  Simonne Come 09/06/2016, 10:01 AM

## 2016-09-06 NOTE — Plan of Care (Signed)
Problem: RH BLADDER ELIMINATION Goal: RH STG MANAGE BLADDER WITH ASSISTANCE STG Manage Bladder With mod Assistance   Outcome: Progressing Pt will independently call for assistance to toilet when she feels urge to void.

## 2016-09-06 NOTE — Progress Notes (Signed)
Occupational Therapy Session Note  Patient Details  Name: Yvonne Parrish MRN: [id number] Date of Birth: Dec 01, 1928  Today's Date: 09/06/2016 OT Individual Time: [id number] Individual Time Calculation (min): 72 min    Short Term Goals: Week 2:  OT Short Term Goal 1 (Week 2): Pt will transfer to toilet/ BSC with mod A  OT Short Term Goal 2 (Week 2): Pt will don pants at sit > stand level with min assist OT Short Term Goal 3 (Week 2): Pt will complete UB dressing with supervision OT Short Term Goal 4 (Week 2): Pt will utilize RUE at diminished level during functional task of self-feeding or dressing  Skilled Therapeutic Interventions/Progress Updates:    Treatment session with focus on functional mobility and transfers in simulated home setup as well as activity tolerance and RUE fine motor control.  Completed simulated walk-in shower with 3" ledge with focus on sidestepping and stepping over threshold, requiring min assist when stepping over threshold.  Educated on sequence of transfer to increase safety with pt able to return demonstration.  Completed simulated access in to bathroom with ambulating along counter top with sidestepping to toilet.  Pt able to complete with close supervision and hand held assist when transitioning from counter top to toilet.  Engaged in jigsaw puzzle activity in sitting and standing with focus on RUE motor control with flipping over pieces and placing in correct places while also addressing standing tolerance.  Pt able to stand for 12 mins before requiring seated rest break.  Returned to room and completed bed mobility with supervision in/out of bed and then left in bed with all needs in reach.  Therapy Documentation Precautions:  Precautions Precautions: Fall Precaution Comments: decreased DME safety awareness, requires verbal cues Restrictions Weight Bearing Restrictions: No General:   Vital Signs: Therapy Vitals Temp: 98 F (36.7 C) Temp Source:  Oral Pulse Rate: 64 Resp: 16 BP: (!) 124/51 Patient Position (if appropriate): Sitting Oxygen Therapy SpO2: 100 % O2 Device: Not Delivered Pain: Pain Assessment Pain Assessment: 0-10 Pain Score: 0-No pain Pain Type: Acute pain Pain Location: Back Pain Descriptors / Indicators: Aching Pain Intervention(s): Medication (See eMAR)  See Function Navigator for Current Functional Status.   Therapy/Group: Individual Therapy  Simonne Come 09/06/2016, 3:50 PM

## 2016-09-06 NOTE — Plan of Care (Signed)
Problem: RH COGNITION-NURSING Goal: RH STG USES MEMORY AIDS/STRATEGIES W/ASSIST TO PROBLEM SOLVE STG Uses Memory Aids/Strategies With mod Assistance to Problem Solve.  Outcome: Progressing Allow the pt time to process questions and instructions. Assess for understanding and recall.

## 2016-09-06 NOTE — Discharge Summary (Signed)
Physician Discharge Summary  Patient ID: Yvonne Parrish MRN: [id number] DOB/AGE: [birth year] 83 y.o.  Admit date: 08/22/2016 Discharge date: 09/07/2016  Discharge Diagnoses:  Principal Problem:   Left pontine stroke [facility]) Active Problems:   Right hemiparesis (HCC)   Gait disturbance, post-stroke   Benign essential HTN   Chronic systolic CHF (congestive heart failure) (HCC)   Acute blood loss anemia   Hypokalemia   Sleep disturbance   Discharged Condition:  Stable   Significant Diagnostic Studies:  N/A   Labs:  Basic Metabolic Panel:  Recent Labs Lab 09/04/16 0548 09/05/16 0547  NA 136 136  K 3.8 3.7  CL 103 104  CO2 24 24  GLUCOSE 113* 116*  BUN 22* 16  CREATININE 0.87 0.83  CALCIUM 9.2 9.2    CBC:  Recent Labs Lab 09/05/16 0547  WBC 7.0  HGB 10.5*  HCT 32.0*  MCV 99.1  PLT 252    CBG: No results for input(s): GLUCAP in the last 168 hours.  Brief HPI:   Yvonne R Jonesis an 83 y.o. RH female with history of HTN, interstitial lung disease, CAD s/p CABG, chronic systolic CHF, TIA;  who was admitted on 08/21/15 with right sided weakness and difficulty speaking. CTA head/neck revealed 3.9 cm ascending thoracic aorta, moderate narrowing of right SA (complex plaque) and R-VA and moderate narrowing of proximal R-PCA. MRI brain done revealing small left paracentral pontine, C3-C4 protrusion with moderate stenosis and cord flattening as well as intracranial atherosclerotic changes involving posterior circulation. Carotid dopplers without significant ICA stenosis.  Dr. Leonie Man felt that infarct secondary to small vessel disease. Patient declined Plavix and was started on Aggrenox for secondary stroke prevention. Patient with resultant right sided weakness with sensory deficits and right knee instability, posterior lean, dysphagia and expressive deficits. CIR recommended for follow up therapy.    Hospital Course: Yvonne Parrish was admitted to rehab 08/22/2016 for  inpatient therapies to consist of PT, ST and OT at least three hours five days a week. Past admission physiatrist, therapy team and rehab RN have worked together to provide customized collaborative inpatient rehab.  Her blood pressures have been monitored closely and is controlled on lisinopril and Norvasc. Weights were monitored daily and have been stable without signs of overload. Weight is stable overall and is 148 lbs at discharge. She was transitioned to aggrenox bid and is tolerating this without SE. Swallow function has improved and she was advanced to regular textures with good po intake. She is continent of bowel and bladder.  Pepcid was added to help manage chronic nausea. Hypokalemia was supplemented briefly and is stable on follow up check of lytes.  Pre renal azotemia has resolve with encouragement of fluid intake. Repeat CBC of 1/25 showed show drop in H/H therefore stool guaiacs ordered. No signs of bleeding reported and HHRN to recheck CBC on 1/29 with results to primary MD. She was fitted with R-AFO for chronic foot drop and this has improved gait quality. She has progressed to supervision level and will continue to receive follow up HHPT and [place] [facility] [place] after discharge.    Rehab course: During patient's stay in rehab weekly team conferences were held to monitor patient's progress, set goals and discuss barriers to discharge. At admission, patient required max assist with mobility and max to total assist with self care tasks. She exhibited min to moderate oral dysphagia with mild expressive aphasia and flaccid dysarthria. She has had improvement in activity tolerance, balance, postural  control, as well as ability to compensate for deficits.  She is has had improvement in functional use RUE  and RLE as well as improved awareness. She is able to complete ADL tasks with supervision and requires min assist for bathroom transfers.  She is tolerating regular diet without s/s of  aspiration and is able to utilize safe swallow strategies at modified independent level. Speech is 100% intelligible at conversation level and she is able to utilize speech and word finding strategies independently.  She is requires supervision for transfers and to ambulate 200' with walker and cues for safety.  Family education was completed regarding all aspects of care and safety. Her daughters will provide supervision after discharge.     Disposition:  Home  Diet: Heart Healthy, Carb modified.   Special Instructions: 1. Needs 24 hours supervision. 2.  HHRN to recheck CBC 1/29 with results to Dr. Ginette Pitman  Discharge Instructions    Ambulatory referral to Physical Medicine Rehab    Complete by:  As directed    1-2 weeks follow up appointment     Allergies as of 09/06/2016   No Known Allergies     Medication List    STOP taking these medications   acetaminophen 325 MG tablet Commonly known as:  TYLENOL   aspirin EC 81 MG tablet   CALTRATE 600+D PLUS PO   docusate sodium 100 MG capsule Commonly known as:  COLACE   pseudoephedrine-guaifenesin 60-600 MG 12 hr tablet Commonly known as:  MUCINEX D   simvastatin 40 MG tablet Commonly known as:  ZOCOR     TAKE these medications   albuterol 108 (90 Base) MCG/ACT inhaler Commonly known as:  PROVENTIL HFA;VENTOLIN HFA Inhale 1-2 puffs into the lungs every 4 (four) hours as needed for wheezing.   amLODipine 5 MG tablet Commonly known as:  NORVASC Take 1 tablet (5 mg total) by mouth daily. Start taking on:  09/07/2016   ASTELIN 0.1 % nasal spray Generic drug:  azelastine Place 2 sprays into both nostrils daily as needed for rhinitis or allergies.   atorvastatin 20 MG tablet Commonly known as:  LIPITOR Take 1 tablet (20 mg total) by mouth daily at 6 PM.   cyclobenzaprine 10 MG tablet Commonly known as:  FLEXERIL Take 0.5 tablets (5 mg total) by mouth 3 (three) times daily as needed for muscle spasms. MORNING AND  BEDTIME What changed:  how much to take  when to take this  reasons to take this   dipyridamole-aspirin 200-25 MG 12hr capsule Commonly known as:  AGGRENOX Take 1 capsule by mouth 2 (two) times daily. What changed:  Another medication with the same name was removed. Continue taking this medication, and follow the directions you see here.   famotidine 20 MG tablet Commonly known as:  PEPCID Take 1 tablet (20 mg total) by mouth daily. Start taking on:  09/07/2016   FLUoxetine 10 MG capsule Commonly known as:  PROZAC Take 1 capsule (10 mg total) by mouth daily. Start taking on:  09/07/2016   levothyroxine 88 MCG tablet Commonly known as:  SYNTHROID, LEVOTHROID Take 88 mcg by mouth daily before breakfast.   lisinopril 40 MG tablet Commonly known as:  PRINIVIL,ZESTRIL Take 1 tablet (40 mg total) by mouth daily.   loratadine 10 MG tablet Commonly known as:  CLARITIN Take 10 mg by mouth daily.   metoprolol succinate 50 MG 24 hr tablet Commonly known as:  TOPROL-XL Take 1 tablet (50 mg total) by mouth daily.  MUSCLE RUB 10-15 % Crea Apply 1 application topically 2 (two) times daily.   polyethylene glycol powder powder Commonly known as:  GLYCOLAX/MIRALAX Take 17 g by mouth daily.   ranitidine 150 MG tablet Commonly known as:  ZANTAC Take 150 mg by mouth 2 (two) times daily.   Vitamin D-3 1000 units Caps Take 1,000 Units by mouth daily.      Follow-up Information    Charlett Blake, MD Follow up.   Specialty:  Physical Medicine and Rehabilitation Why:  office will call you with follow up appointment Contact information: [place] [address] [phone]        Antony Contras, MD. Call in 3 day(s).   Specialties:  Neurology, Radiology Why:  for follow up appointment in 4 weeks Contact information: [address] [place] [address] [phone]        Tracie Harrier, MD Follow up on 09/18/2016.   Specialty:   Internal Medicine Why:  Appointment @ 11:00 am Contact information: [place]  [address] [phone]           Signed: Bary Leriche 09/06/2016, 3:54 PM

## 2016-09-06 NOTE — Progress Notes (Signed)
Occupational Therapy Discharge Summary  Patient Details  Name: Yvonne Parrish MRN: [id number] Date of Birth: 08-10-1929  Patient has met 69 of 14 long term goals due to improved activity tolerance, improved balance, postural control, ability to compensate for deficits, functional use of  RIGHT upper and RIGHT lower extremity, improved awareness and improved coordination.  Patient to discharge at overall Supervision level, min assist bathroom transfers as pt's bathroom is not accessibly via RW or Rollator and will require hand held assist.  Patient's care partner is independent to provide the necessary physical assistance at discharge.    Reasons goals not met: N/A  Recommendation:  Patient will benefit from ongoing skilled OT services in home health setting to continue to advance functional skills in the area of BADL and Reduce care partner burden.  Equipment: No equipment provided  Reasons for discharge: treatment goals met and discharge from hospital  Patient/family agrees with progress made and goals achieved: Yes  OT Discharge Precautions/Restrictions  Precautions Precautions: Fall Precaution Comments: decreased DME safety awareness, requires verbal cues Restrictions Weight Bearing Restrictions: No Pain Pain Assessment Pain Assessment: No/denies pain Pain Score: 0-No pain ADL ADL ADL Comments: see functional ambulation  Vision/Perception  Vision- History Baseline Vision/History: Wears glasses Wears Glasses: At all times Patient Visual Report: No change from baseline Vision- Assessment Vision Assessment?: No apparent visual deficits  Cognition Overall Cognitive Status: History of cognitive impairments - at baseline Arousal/Alertness: Awake/alert Orientation Level: Oriented X4 ([facility] hospital in [place]) Attention: Sustained;Selective Sustained Attention: Appears intact Selective Attention: Appears intact Memory: Impaired Memory Impairment: Decreased recall of new  information Awareness: Impaired Awareness Impairment: Emergent impairment;Anticipatory impairment Problem Solving: Impaired Problem Solving Impairment: Functional basic Sensation Sensation Light Touch: Appears Intact Proprioception: Appears Intact Coordination Gross Motor Movements are Fluid and Coordinated: Yes Fine Motor Movements are Fluid and Coordinated: No Coordination and Movement Description: mild impairment of coordination in RUE 9 Hole Peg Test: Rt: 1:07 and Lt: 39 seconds Motor  Motor Motor: Other (comment) Motor - Discharge Observations: R foot drop (chronic), mild R hemiparesis  Mobility  Transfers Sit to Stand: 5: Supervision Sit to Stand Details: Verbal cues for safe use of DME/AE Stand to Sit: 5: Supervision Stand to Sit Details (indicate cue type and reason): Verbal cues for safe use of DME/AE  Trunk/Postural Assessment  Thoracic Assessment Thoracic Assessment: Exceptions to [facility] (minimal kyphosis, rounded shoulders) Lumbar Assessment Lumbar Assessment: Exceptions to [facility] (posterior pelvic tilt, somewhat fixed) Postural Control Postural Control: Within Functional Limits  Balance Balance Balance Assessed: Yes Static Sitting Balance Static Sitting - Balance Support: Feet supported Static Sitting - Level of Assistance: 6: Modified independent (Device/Increase time) Dynamic Sitting Balance Dynamic Sitting - Balance Support: Feet supported Dynamic Sitting - Level of Assistance: 6: Modified independent (Device/Increase time) Static Standing Balance Static Standing - Balance Support: Left upper extremity supported;Right upper extremity supported;No upper extremity supported;During functional activity Static Standing - Level of Assistance: 5: Stand by assistance Dynamic Standing Balance Dynamic Standing - Balance Support: During functional activity;Right upper extremity supported;Left upper extremity supported;No upper extremity supported Dynamic Standing - Level  of Assistance: 5: Stand by assistance Extremity/Trunk Assessment RUE Assessment RUE Assessment: Exceptions to [facility] RUE AROM (degrees) RUE Overall AROM Comments: shoulder flexion grossly 120, WFL for self-care tasks with ability to wash and brush hair with RUE RUE Strength RUE Overall Strength Comments: grossly 4-/5 RUE Tone RUE Tone: Within Functional Limits LUE Assessment LUE Assessment: Within Functional Limits   See Function Navigator for Current Functional  Status.  Simonne Come 09/06/2016, 10:36 AM

## 2016-09-06 NOTE — Progress Notes (Signed)
Physical Therapy Discharge Summary  Patient Details  Name: Yvonne Parrish MRN: [id number] Date of Birth: [birth year]  Today's Date: 09/06/2016 PT Individual Time: 0800-0900 PT Individual Time Calculation (min): 60 min    Patient has met 12 of 12 long term goals due to improved activity tolerance, improved balance, improved postural control, increased strength, ability to compensate for deficits, functional use of  right upper extremity and right lower extremity, improved attention, improved awareness and improved coordination.  Patient to discharge at household, limited community ambulatory level, requiring w/c for longer community distances Supervision.   Patient's care partner is independent to provide the necessary cognitive assistance at discharge.   Recommendation:  Patient will benefit from ongoing skilled PT services in home health setting to continue to advance safe functional mobility, address ongoing impairments in endurance, balance, and coordination, and minimize fall risk.  Equipment: Transport w/c and Toe Off AFO  Reasons for discharge: treatment goals met  Patient/family agrees with progress made and goals achieved: Yes   Skilled Therapeutic Intervention: No c/o pain.  Session focus on activity tolerance, gait training, stair negotiation, and transfers in addition to reassessment of functional mobility, strength, balance, and coordination.    Pt transfers throughout session with supervision, but continues to require occasional verbal cues for safety with DME including locking brakes, and pushing up from sitting surfaces.  Gait throughout unit, max distance 200' with rollator and distant supervision.  Stair negotiation x12 steps (8 with 1 rail, 4 with 2 rails) and overall supervision, min verbal cues for sequencing LE advancement and for step-to pattern for safety.  Discussed method of home entry/exit as pt does not have a rail along the whole set of steps, rather a grab bar next  to the doorway.  Handoff to OT at end of session.   PT Discharge Precautions/Restrictions Precautions Precautions: Fall Precaution Comments: decreased DME safety awareness, requires verbal cues Restrictions Weight Bearing Restrictions: No Pain Pain Assessment Pain Assessment: No/denies pain Pain Score: 0-No pain  Cognition Overall Cognitive Status: History of cognitive impairments - at baseline Arousal/Alertness: Awake/alert Orientation Level: Oriented X4 (states hospital in [place]) Attention: Sustained;Selective Sustained Attention: Appears intact Selective Attention: Appears intact Memory: Impaired Memory Impairment: Decreased recall of new information Awareness: Impaired Awareness Impairment: Emergent impairment;Anticipatory impairment Problem Solving: Impaired Problem Solving Impairment: Functional basic Sensation Sensation Light Touch: Appears Intact Proprioception: Appears Intact Coordination Gross Motor Movements are Fluid and Coordinated: Yes Fine Motor Movements are Fluid and Coordinated: No Coordination and Movement Description: mild impairment of coordination in RUE 9 Hole Peg Test: Rt: 1:07 and Lt: 39 seconds Motor  Motor Motor: Other (comment) Motor - Discharge Observations: R foot drop (chronic), mild R hemiparesis   Mobility Transfers Transfers: Yes Sit to Stand: 5: Supervision Sit to Stand Details: Verbal cues for safe use of DME/AE Stand to Sit: 5: Supervision Stand to Sit Details (indicate cue type and reason): Verbal cues for safe use of DME/AE Stand Pivot Transfers: 5: Supervision Stand Pivot Transfer Details: Verbal cues for safe use of DME/AE Locomotion  Ambulation Ambulation: Yes Ambulation/Gait Assistance: 5: Supervision Ambulation Distance (Feet): 160 Feet Assistive device: 4-wheeled walker Gait Gait: Yes Gait Pattern: Impaired Gait Pattern: Step-to pattern;Decreased dorsiflexion - right Gait velocity: decreased Stairs /  Additional Locomotion Stairs: Yes Stairs Assistance: 5: Supervision Stair Management Technique: One rail Right;Two rails Number of Stairs: 12 Wheelchair Mobility Wheelchair Mobility: No  Trunk/Postural Assessment  Thoracic Assessment Thoracic Assessment: Exceptions to [facility] (minimal kyphosis, rounded shoulders) Lumbar Assessment Lumbar Assessment:  Exceptions to [facility] (posterior pelvic tilt, somewhat fixed) Postural Control Postural Control: Within Functional Limits  Balance Balance Balance Assessed: Yes Static Sitting Balance Static Sitting - Balance Support: Feet supported Static Sitting - Level of Assistance: 6: Modified independent (Device/Increase time) Dynamic Sitting Balance Dynamic Sitting - Balance Support: Feet supported Dynamic Sitting - Level of Assistance: 6: Modified independent (Device/Increase time) Static Standing Balance Static Standing - Balance Support: Left upper extremity supported;Right upper extremity supported;No upper extremity supported;During functional activity Static Standing - Level of Assistance: 5: Stand by assistance Dynamic Standing Balance Dynamic Standing - Balance Support: During functional activity;Right upper extremity supported;Left upper extremity supported;No upper extremity supported Dynamic Standing - Level of Assistance: 5: Stand by assistance Extremity Assessment  RUE Assessment RUE Assessment: Exceptions to [facility] RUE AROM (degrees) RUE Overall AROM Comments: shoulder flexion grossly 120, WFL for self-care tasks with ability to wash and brush hair with RUE RUE Strength RUE Overall Strength Comments: grossly 4-/5 RUE Tone RUE Tone: Within Functional Limits LUE Assessment LUE Assessment: Within Functional Limits RLE Assessment RLE Assessment: Exceptions to [facility] RLE AROM (degrees) RLE Overall AROM Comments: full AROM hip flexion and knee extension in sitting, absent DF 2/2 chronic R foot drop RLE PROM (degrees) RLE Overall PROM  Comments: DF to neutral RLE Strength Right Hip Flexion: 3/5 Right Knee Flexion: 3+/5 Right Knee Extension: 4/5 Right Ankle Dorsiflexion: 0/5 (chronic) Right Ankle Plantar Flexion:  (not tested 2/2 AFO in place) LLE Assessment LLE Assessment: Exceptions to [facility] LLE Strength Left Hip Flexion: 3+/5 Left Knee Flexion: 4+/5 Left Knee Extension: 4+/5 Left Ankle Dorsiflexion: 4+/5 Left Ankle Plantar Flexion: 4+/5   See Function Navigator for Current Functional Status.  Jerime Arif E Penven-Crew 09/06/2016, 11:09 AM

## 2016-09-06 NOTE — Discharge Instructions (Signed)
Inpatient Rehab Discharge Instructions  Yvonne Parrish Discharge date and time: 09/06/16   Activities/Precautions/ Functional Status: Activity: no lifting, driving, or strenuous exercise till cleared by MD Diet: cardiac diet and diabetic diet Wound Care: none needed   Functional status:  ___ No restrictions     ___ Walk up steps independently _X__ 24/7 supervision/assistance   ___ Walk up steps with assistance ___ Intermittent supervision/assistance  ___ Bathe/dress independently _X__ Walk with walker    _X__ Bathe/dress with assistance ___ Walk Independently    ___ Shower independently ___ Walk with assistance    ___ Shower with assistance ___ No alcohol     ___ Return to work/school ________  Special Instructions:    COMMUNITY REFERRALS UPON DISCHARGE:    Home Health:   [facility]    Date of last service:09/07/2016  Medical Equipment/Items Ordered:TRANSPORT CHAIR  Agency/Supplier:[facility]   [phone]   GENERAL COMMUNITY RESOURCES FOR PATIENT/FAMILY: Support Groups:[facility] SUPPORT GROUP EVERY SECOND Thursday @ 3:00-4:00 PM ON THE REHAB UNIT QUESTIONS CONTACT CAITLYN [id number]  STROKE/TIA DISCHARGE INSTRUCTIONS SMOKING Cigarette smoking nearly doubles your risk of having a stroke & is the single most alterable risk factor  If you smoke or have smoked in the last 12 months, you are advised to quit smoking for your health.  Most of the excess cardiovascular risk related to smoking disappears within a year of stopping.  Ask you doctor about anti-smoking medications  [place] Quit Line: 1-800-QUIT NOW  Free Smoking Cessation Classes [phone]  CHOLESTEROL Know your levels; limit fat & cholesterol in your diet  Lipid Panel     Component Value Date/Time   CHOL 168 08/21/2016 0628   TRIG 87 08/21/2016 0628   HDL 57 08/21/2016 0628   CHOLHDL 2.9 08/21/2016 0628   VLDL 17 08/21/2016 0628   LDLCALC 94 08/21/2016 0628       Many patients benefit from treatment even if their cholesterol is at goal.  Goal: Total Cholesterol (CHOL) less than 160  Goal:  Triglycerides (TRIG) less than 150  Goal:  HDL greater than 40  Goal:  LDL (LDLCALC) less than 100   BLOOD PRESSURE American Stroke Association blood pressure target is less that 120/80 mm/Hg  Your discharge blood pressure is:  BP: (!) 128/46  Monitor your blood pressure  Limit your salt and alcohol intake  Many individuals will require more than one medication for high blood pressure  DIABETES (A1c is a blood sugar average for last 3 months) Goal HGBA1c is under 7% (HBGA1c is blood sugar average for last 3 months)  Diabetes: Prediabetic  Lab Results  Component Value Date   HGBA1C 6.0 (H) 08/21/2016     Your HGBA1c can be lowered with medications, healthy diet, and exercise.  Check your blood sugar as directed by your physician  Call your physician if you experience unexplained or low blood sugars.  PHYSICAL ACTIVITY/REHABILITATION Goal is 30 minutes at least 4 days per week  Activity: No driving, Therapies: See above Return to work: N/A  Activity decreases your risk of heart attack and stroke and makes your heart stronger.  It helps control your weight and blood pressure; helps you relax and can improve your mood.  Participate in a regular exercise program.  Talk with your doctor about the best form of exercise for you (dancing, walking, swimming, cycling).  DIET/WEIGHT Goal is to maintain a healthy weight  Your discharge diet is: Diet  Heart Room service appropriate? Yes; Fluid consistency: Thin liquids Your height is:  Height: 5\' 4"  (162.6 cm) Your current weight is: Weight: 67.4 kg (148 lb 9.4 oz) Your Body Mass Index (BMI) is:  BMI (Calculated): 25.9  Following the type of diet specifically designed for you will help prevent another stroke.  Your goal weight is: 145 lbs  Your goal Body Mass Index (BMI) is 19-24.  Healthy food  habits can help reduce 3 risk factors for stroke:  High cholesterol, hypertension, and excess weight.  RESOURCES Stroke/Support Group:  Call [phone]   STROKE EDUCATION PROVIDED/REVIEWED AND GIVEN TO PATIENT Stroke warning signs and symptoms How to activate emergency medical system (call 911). Medications prescribed at discharge. Need for follow-up after discharge. Personal risk factors for stroke. Pneumonia vaccine given:  Flu vaccine given:  My questions have been answered, the writing is legible, and I understand these instructions.  I will adhere to these goals & educational materials that have been provided to me after my discharge from the hospital.     My questions have been answered and I understand these instructions. I will adhere to these goals and the provided educational materials after my discharge from the hospital.  Patient/Caregiver Signature _______________________________ Date __________  Clinician Signature _______________________________________ Date __________  Please bring this form and your medication list with you to all your follow-up doctor's appointments.

## 2016-09-07 NOTE — Progress Notes (Signed)
Patient ID: Yvonne Parrish, female   DOB: [birth year], 83 y.o.   MRN: [id number]   09/07/2016.  Subjective/Complaints: 83 y/o admit with functional deficits secondary to pontine infarct.  Alert without complaints.  Anxious for d/c today.  Past Medical History:  Diagnosis Date  . Asthma in child    question of interstitial lung disease  . CHF (congestive heart failure) ([place])   . Chronic back pain   . Chronic pain of right knee    for past 30 years  . Heart murmur   . History of right foot drop   . Hypertension   . Hypothyroidism   . Myelodysplastic syndrome ([place])      Objective: Vital Signs: Blood pressure (!) 159/51, pulse 72, temperature 98 F (36.7 C), temperature source Oral, resp. rate 18, height 5\' 4"  (1.626 m), weight 149 lb 14.6 oz (68 kg), SpO2 98 %. No results found.  Recent Labs  09/05/16 0547  WBC 7.0  HGB 10.5*  HCT 32.0*  PLT 252    Recent Labs  09/05/16 0547  NA 136  K 3.7  CL 104  CO2 24  GLUCOSE 116*  BUN 16  CREATININE 0.83  CALCIUM 9.2   CBG (last 3)  No results for input(s): GLUCAP in the last 72 hours.  Wt Readings from Last 3 Encounters:  09/07/16 149 lb 14.6 oz (68 kg)  08/20/16 148 lb 1.6 oz (67.2 kg)    Physical Exam:  General appearance: alert Head: neg HEENT- neg Neck:  No NVD  Resp: clear Cardio: regular GI: soft and flat Extremities: neg no edema    Assessment/Plan: 1. Functional deficits secondary to pontine stroke.  Stable for d/c. D/c planning complete ADL:      ELOS (Days) 16  Gracelee Stemmler FRANK 09/07/2016, 8:42 AM

## 2016-09-07 NOTE — Progress Notes (Signed)
Patient discharged. Her daughter is present and they both deny questions regarding discharge instructions/prescriptions. Nurse tech escorted patient/daugher to lobby via wheelchair.  Roney Marion, RN

## 2016-09-09 ENCOUNTER — Telehealth: Payer: Self-pay | Admitting: *Deleted

## 2016-09-09 NOTE — Telephone Encounter (Signed)
They can take one 325 mg enteric-coated aspirin per day until they get in with neurology and other alternatives can be discussed

## 2016-09-09 NOTE — Telephone Encounter (Signed)
Yvonne Parrish was released from hospital over the weekend and they have found they are not able to afford the aggrenox.  It is >$300 for one month supply.  Please advise if there is something else they can use.

## 2016-09-10 NOTE — Telephone Encounter (Signed)
I have given the daughter the information and her upcoming appointment with  Dr Letta Pate 10/01/16 @12 :30 arriving by 12:00

## 2016-09-17 ENCOUNTER — Encounter (INDEPENDENT_AMBULATORY_CARE_PROVIDER_SITE_OTHER): Payer: Self-pay

## 2016-09-17 ENCOUNTER — Ambulatory Visit (INDEPENDENT_AMBULATORY_CARE_PROVIDER_SITE_OTHER): Payer: Self-pay | Admitting: Vascular Surgery

## 2016-09-30 DIAGNOSIS — I69351 Hemiplegia and hemiparesis following cerebral infarction affecting right dominant side: Secondary | ICD-10-CM | POA: Diagnosis not present

## 2016-09-30 DIAGNOSIS — I420 Dilated cardiomyopathy: Secondary | ICD-10-CM | POA: Diagnosis not present

## 2016-09-30 DIAGNOSIS — J441 Chronic obstructive pulmonary disease with (acute) exacerbation: Secondary | ICD-10-CM | POA: Diagnosis not present

## 2016-09-30 DIAGNOSIS — I69320 Aphasia following cerebral infarction: Secondary | ICD-10-CM | POA: Diagnosis not present

## 2016-10-01 ENCOUNTER — Encounter: Payer: Medicare Other | Attending: Physical Medicine & Rehabilitation

## 2016-10-01 ENCOUNTER — Encounter: Payer: Self-pay | Admitting: Physical Medicine & Rehabilitation

## 2016-10-01 ENCOUNTER — Ambulatory Visit (HOSPITAL_BASED_OUTPATIENT_CLINIC_OR_DEPARTMENT_OTHER): Payer: Medicare Other | Admitting: Physical Medicine & Rehabilitation

## 2016-10-01 VITALS — BP 174/69 | HR 62 | Resp 14

## 2016-10-01 DIAGNOSIS — M21371 Foot drop, right foot: Secondary | ICD-10-CM | POA: Diagnosis not present

## 2016-10-01 DIAGNOSIS — I69351 Hemiplegia and hemiparesis following cerebral infarction affecting right dominant side: Secondary | ICD-10-CM | POA: Insufficient documentation

## 2016-10-01 DIAGNOSIS — I69359 Hemiplegia and hemiparesis following cerebral infarction affecting unspecified side: Secondary | ICD-10-CM | POA: Diagnosis not present

## 2016-10-01 DIAGNOSIS — I10 Essential (primary) hypertension: Secondary | ICD-10-CM | POA: Insufficient documentation

## 2016-10-01 MED ORDER — FLUOXETINE HCL 10 MG PO CAPS: 10.0000 mg | ORAL_CAPSULE | Freq: Every day | ORAL | 0 refills | Status: DC

## 2016-10-01 MED ORDER — AMLODIPINE BESYLATE 5 MG PO TABS: 5.0000 mg | ORAL_TABLET | Freq: Every day | ORAL | 0 refills | Status: DC

## 2016-10-01 MED ORDER — LISINOPRIL 40 MG PO TABS: 40.0000 mg | ORAL_TABLET | Freq: Every day | ORAL | 0 refills | Status: DC

## 2016-10-01 MED ORDER — ATORVASTATIN CALCIUM 20 MG PO TABS: 20.0000 mg | ORAL_TABLET | Freq: Every day | ORAL | 0 refills | Status: DC

## 2016-10-01 MED ORDER — METOPROLOL SUCCINATE ER 50 MG PO TB24: 50.0000 mg | ORAL_TABLET | Freq: Every day | ORAL | 0 refills | Status: DC

## 2016-10-01 NOTE — Progress Notes (Signed)
Subjective:    Patient ID: Yvonne Parrish, female    DOB: 11/16/1928, 83 y.o.   MRN: [id number] 83 y.o. RH female with history of HTN, interstitial lung disease, CAD s/p CABG, chronic systolic CHF,  TIA;   who was admitted on 08/21/15 with right sided weakness and difficulty speaking. CTA head/neck revealed 3.9 cm ascending thoracic aorta, moderate narrowing of right SA (complex plaque) and R-VA and moderate narrowing of proximal R-PCA.  MRI brain done revealing small left paracentral pontine, C3-C4 protrusion with moderate stenosis and cord flattening as well as intracranial atherosclerotic changes involving posterior circulation. Carotid dopplers without significant ICA stenosis.   Dr. Leonie Man felt that infarct secondary to small vessel disease.  Patient declined Plavix and was started on Aggrenox for secondary stroke prevention. Patient with resultant right sided weakness with sensory deficits and right knee instability, posterior lean, dysphagia and expressive deficits Admit date: 08/22/2016 Discharge date: 09/07/2016  HPI  Pt is at home, daughters come in to stay.  Pt sleeps in lift recliner, gets up at night to go to bathroom Pt dresses and bathes herself, takes a long shower Uses walker to ambulate (used walker prior to CVA) Hx of foot drop >5 yrs ago, MRI of the lumbar spine in 2010 demonstrated L4-5 disc extrusion with severe neural foraminal narrowing. Had epidural injections. No lumbar surgery. Pain Inventory Average Pain 6 Pain Right Now 0 My pain is sharp  In the last 24 hours, has pain interfered with the following? General activity 5 Relation with others 5 Enjoyment of life 5 What TIME of day is your pain at its worst? no selection Sleep (in general) Good  Pain is worse with: standing Pain improves with: rest, heat/ice and medication Relief from Meds: 5  Mobility walk with assistance use a walker how many minutes can you walk? 5-10 do you drive?  no use a wheelchair Do  you have any goals in this area?  no  Function retired I need assistance with the following:  household duties and shopping  Neuro/Psych bladder control problems  Prior Studies CT/MRI hospital f/u  Physicians involved in your care hospital f/u   Family History  Problem Relation Age of Onset  . Stroke Mother   . Heart disease Father    Social History   Social History  . Marital status: Widowed    Spouse name: N/A  . Number of children: N/A  . Years of education: N/A   Social History Main Topics  . Smoking status: Never Smoker  . Smokeless tobacco: Never Used  . Alcohol use No  . Drug use: No  . Sexual activity: No   Other Topics Concern  . None   Social History Narrative  . None   Past Surgical History:  Procedure Laterality Date  . CORONARY ANGIOPLASTY    . CORONARY ARTERY BYPASS GRAFT  1999  . KNEE ARTHROSCOPY    . knee fracture     Right knee pinning in '90's   . LUMBAR SPINE SURGERY    . TONSILLECTOMY     Past Medical History:  Diagnosis Date  . Asthma in child    question of interstitial lung disease  . CHF (congestive heart failure) ([place])   . Chronic back pain   . Chronic pain of right knee    for past 30 years  . Heart murmur   . History of right foot drop   . Hypertension   . Hypothyroidism   . Myelodysplastic syndrome ([place])  BP (!) 174/69   Pulse 62   Resp 14   SpO2 98%   Opioid Risk Score:   Fall Risk Score:  `1  Depression screen PHQ 2/9  No flowsheet data found.  Review of Systems  Constitutional: Negative.   HENT: Negative.   Eyes: Negative.   Respiratory: Negative.   Cardiovascular: Negative.   Gastrointestinal: Negative.   Endocrine: Negative.   Genitourinary: Positive for difficulty urinating.  Musculoskeletal: Positive for gait problem.  Skin: Negative.   Allergic/Immunologic: Negative.   Hematological: Negative.   Psychiatric/Behavioral: Negative.        Objective:   Physical Exam  Constitutional: She  is oriented to person, place, and time. She appears well-developed and well-nourished.  HENT:  Head: Normocephalic and atraumatic.  Eyes: Conjunctivae are normal. Pupils are equal, round, and reactive to light.  Cardiovascular: Normal rate, regular rhythm and normal heart sounds.   Pulmonary/Chest: Effort normal and breath sounds normal. No respiratory distress.  Abdominal: Soft. Bowel sounds are normal. She exhibits no distension. There is no tenderness.  Neurological: She is alert and oriented to person, place, and time.  Motor strength is 4/5 in the right deltoid, biceps, triceps, grip, 5/5 in left deltoid, biceps, triceps, grip 4/5 in the right hip flexor, knee extensors, 0 at the ankle dorsiflexor. 5/5 and left hip flexor, knee extensor and dorsiflexor.   Psychiatric: She has a normal mood and affect. Her behavior is normal.  Point 2, to person, place, month, not date  Nursing note and vitals reviewed.         Assessment & Plan:  1.  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 Her right foot drop is related to history of L4-L5 radiculopathy. She is pretty close to her baseline. She still has some fine motor deficits, but does not wish to pursue any additional OT or PT. She will need the right AFO on a long-term basis. She'll need to use the walker on a long-term basis. Family can slowly to decrease the amount of supervision. She was not driving before her stroke and therefore will will not have as as a goal  2. Hypertension. Will follow-up with PCP. Recommend current meds unless blood pressure starts running higher and then it will be at the discretion of her PCP. I will not follow-up with her. Therefore, I will not renew medications beyond an additional month Continue Prozac 10 mg a day Continue Zestril 40 mg a day Continue Toprol-XL 50 mg a day Continue amlodipine 5 mg per day Continue Lipitor 20 mg a day  3. Secondary stroke prophylaxis continue  meds as per PCP, neurology will evaluate the patient to go over other treatment options besides Aggrenox. Given the cost

## 2016-10-01 NOTE — Patient Instructions (Signed)
I have renewed one-month supply of the current medications. Recommend follow-up with primary care for continued prescription. We'll not need to make an appointment with my office. Recommend follow-up with neurology.

## 2016-10-24 ENCOUNTER — Encounter: Payer: Self-pay | Admitting: Neurology

## 2016-10-24 ENCOUNTER — Ambulatory Visit (INDEPENDENT_AMBULATORY_CARE_PROVIDER_SITE_OTHER): Payer: Medicare Other | Admitting: Neurology

## 2016-10-24 ENCOUNTER — Encounter (INDEPENDENT_AMBULATORY_CARE_PROVIDER_SITE_OTHER): Payer: Self-pay

## 2016-10-24 VITALS — BP 150/60 | HR 60 | Ht 62.0 in | Wt 152.8 lb

## 2016-10-24 DIAGNOSIS — I639 Cerebral infarction, unspecified: Secondary | ICD-10-CM

## 2016-10-24 DIAGNOSIS — I6381 Other cerebral infarction due to occlusion or stenosis of small artery: Secondary | ICD-10-CM

## 2016-10-24 NOTE — Progress Notes (Signed)
[facility] [address] [place]. [place] [address] [phone]       OFFICE FOLLOW-UP NOTE  [name] Date of Birth:  [birth year] Medical Record Number:  [id number]   HPI: 13 year lady seen today for the first office follow-up visit following hospital admission in January 2018 for strokes. She is accompanied by daughter. History is up 10 from the patient, daughter and review of hospital medical records. I personally reviewed imaging films.Yvonne R Jonesis an 83 yo female with hx of HTN and CHF presents for wake up stroke an symptoms of difficulty speaking and R sided weakness. Last known normal was 08/19/16 evening. Patient was not administered IV t-PA secondary to delay in arrival. She was admitted for further evaluation and treatment. MRI scan of the brain showed a small left paracentral pontine and left caudate head lacunar infarct. CT angiogram of the brain and neck showed no large vessel intracranial or extracranial stenosis. CT perfusion scan showed no significant mismatch. There is 3.9 cm dilatation of ascending thoracic aorta. Carotid ultrasound showed no significant extracranial stenosis. Transthoracic echo showed normal ejection fraction. LDL cholesterol was 94 mg percent. Hemoglobin A1c was 6.0. Patient was not on aspirin 81 mg prior to admission this was switched to Aggrenox. Patient was transferred to inpatient rehabilitation. She did well and has been discharged home. She is finished PHYSICAL and occupation therapy. She is able to walk with a foot brace for her chronic right foot drop from lumbar radiculopathy. She uses a beachchair for long distances. She still has some weakness in the right hand and diminished fine motor skills. His speech and in has improved. She is tolerating Lipitor well without side effects and plans to have lipid profile checked next month by primary physician. Patient was unable to afford Aggrenox and has switched to aspirin to 25 mg daily  which is tolerating well without bruising or bleeding.   ROS:   14 system review of systems is positive for  fatigue, runny nose, blurred vision, incontinence, food allergies, bladder urgency, speech difficulty, back pain, walking difficulty and all other systems negative PMH:  Past Medical History:  Diagnosis Date  . Asthma in child    question of interstitial lung disease  . CHF (congestive heart failure) ([place])   . Chronic back pain   . Chronic pain of right knee    for past 30 years  . Heart murmur   . History of right foot drop   . Hypertension   . Hypothyroidism   . Myelodysplastic syndrome [facility])     Social History:  Social History   Social History  . Marital status: Widowed    Spouse name: N/A  . Number of children: N/A  . Years of education: N/A   Occupational History  . Not on file.   Social History Main Topics  . Smoking status: Never Smoker  . Smokeless tobacco: Never Used  . Alcohol use No  . Drug use: No  . Sexual activity: No   Other Topics Concern  . Not on file   Social History Narrative  . No narrative on file    Medications:   Current Outpatient Prescriptions on File Prior to Visit  Medication Sig Dispense Refill  . albuterol (PROVENTIL HFA;VENTOLIN HFA) 108 (90 Base) MCG/ACT inhaler Inhale 1-2 puffs into the lungs every 4 (four) hours as needed for wheezing.    Marland Kitchen amLODipine (NORVASC) 5 MG tablet Take 1 tablet (5 mg total) by mouth daily. [place]  tablet 0  . atorvastatin (LIPITOR) 20 MG tablet Take 1 tablet (20 mg total) by mouth daily at 6 PM. 30 tablet 0  . azelastine (ASTELIN) 0.1 % nasal spray Place 2 sprays into both nostrils daily as needed for rhinitis or allergies.     . Cholecalciferol (VITAMIN D-3) 1000 units CAPS Take 1,000 Units by mouth daily.    . cyclobenzaprine (FLEXERIL) 10 MG tablet Take 0.5 tablets (5 mg total) by mouth 3 (three) times daily as needed for muscle spasms. MORNING AND BEDTIME 10 tablet 0  . famotidine (PEPCID) 20 MG  tablet Take 1 tablet (20 mg total) by mouth daily. 30 tablet 0  . FLUoxetine (PROZAC) 10 MG capsule Take 1 capsule (10 mg total) by mouth daily. 30 capsule 0  . levothyroxine (SYNTHROID, LEVOTHROID) 88 MCG tablet Take 88 mcg by mouth daily before breakfast.    . lisinopril (PRINIVIL,ZESTRIL) 40 MG tablet Take 1 tablet (40 mg total) by mouth daily. 30 tablet 0  . loratadine (CLARITIN) 10 MG tablet Take 10 mg by mouth daily.    . metoprolol succinate (TOPROL-XL) 50 MG 24 hr tablet Take 1 tablet (50 mg total) by mouth daily. 30 tablet 0  . polyethylene glycol powder (GLYCOLAX/MIRALAX) powder Take 17 g by mouth daily.     No current facility-administered medications on file prior to visit.     Allergies:  No Known Allergies  Physical Exam General: well developed, well nourished, seated, in no evident distress Head: head normocephalic and atraumatic.  Neck: supple with no carotid or supraclavicular bruits Cardiovascular: regular rate and rhythm, no murmurs Musculoskeletal: no deformity Skin:  no rash/petichiae Vascular:  Normal pulses all extremities Vitals:   10/24/16 1531  BP: (!) 150/60  Pulse: 60   Neurologic Exam Mental Status: Awake and fully alert. Oriented to place and time. Recent and remote memory intact. Attention span, concentration and fund of knowledge appropriate. Mood and affect appropriate.  Cranial Nerves: Fundoscopic exam reveals sharp disc margins. Pupils equal, briskly reactive to light. Extraocular movements full without nystagmus. Visual fields full to confrontation. Hearing intact. Facial sensation intact.Mild right lower facial asymmetry., tongue, palate moves normally and symmetrically.  Motor: Normal bulk and tone. Normal strength in all tested extremity muscles Except right foot drop with ankle dorsiflexors 0/5 strength. Mild weakness of right grip and intrinsic hand muscles. Fine finger movements are diminished on the right. Orbits left over right upper  extremity. Sensory.: intact to touch ,pinprick .position and vibratory sensation.  Coordination: Rapid alternating movements normal in all extremities. Finger-to-nose and heel-to-shin performed accurately bilaterally. Gait and Station: Arises from chair without difficulty. Stance is normal. Gait demonstrates normal stride length and balance . Able to heel, toe and tandem walk without difficulty.  Reflexes: 1+ and symmetric. Toes downgoing.   NIHSS  1 Modified Rankin  3   ASSESSMENT: 83 year old Caucasian lady with left pontine and basal ganglia lacunar infarct in January 2018 secondary to small vessel disease. Vascular risk factor of hypertension, hyperlipidemia, cerebrovascular disease and age. Chronic right foot drop secondary to lumbar radiculopathy from degenerative spine disease    PLAN: I had a long d/w patient and her daughter about her recent lacunar strokes, risk for recurrent stroke/TIAs, personally independently reviewed imaging studies and stroke evaluation results and answered questions.Continue aspirin 325 mg daily as she is unable to afford Aggrenox for secondary stroke prevention and maintain strict control of hypertension with blood pressure goal below 130/90, diabetes with hemoglobin A1c goal below 6.5% and  lipids with LDL cholesterol goal below 70 mg/dL. I also advised the patient to eat a healthy diet with plenty of whole grains, cereals, fruits and vegetables, exercise regularly and maintain ideal body weight. Followup in the future with me in 6 months or call earlier if necessary Greater than 50% of time during this 25 minute visit was spent on counseling,explanation of diagnosis of lacunar stroke, planning of further management, discussion with patient and family and coordination of care Antony Contras, MD  [facility] [address] [place], [place] [address]  Phone [phone] Fax [phone] Note: This document was prepared with  digital dictation and possible smart phrase technology. Any transcriptional errors that result from this process are unintentional

## 2016-10-24 NOTE — Patient Instructions (Signed)
I had a long d/w patient and her daughter about her recent lacunar strokes, risk for recurrent stroke/TIAs, personally independently reviewed imaging studies and stroke evaluation results and answered questions.Continue aspirin 325 mg daily as she is unable to afford Aggrenox for secondary stroke prevention and maintain strict control of hypertension with blood pressure goal below 130/90, diabetes with hemoglobin A1c goal below 6.5% and lipids with LDL cholesterol goal below 70 mg/dL. I also advised the patient to eat a healthy diet with plenty of whole grains, cereals, fruits and vegetables, exercise regularly and maintain ideal body weight. Followup in the future with me in 6 months or call earlier if necessary  Stroke Prevention Some medical conditions and behaviors are associated with an increased chance of having a stroke. You may prevent a stroke by making healthy choices and managing medical conditions. How can I reduce my risk of having a stroke?  Stay physically active. Get at least 30 minutes of activity on most or all days.  Do not smoke. It may also be helpful to avoid exposure to secondhand smoke.  Limit alcohol use. Moderate alcohol use is considered to be:  No more than 2 drinks per day for men.  No more than 1 drink per day for nonpregnant women.  Eat healthy foods. This involves:  Eating 5 or more servings of fruits and vegetables a day.  Making dietary changes that address high blood pressure (hypertension), high cholesterol, diabetes, or obesity.  Manage your cholesterol levels.  Making food choices that are high in fiber and low in saturated fat, trans fat, and cholesterol may control cholesterol levels.  Take any prescribed medicines to control cholesterol as directed by your health care provider.  Manage your diabetes.  Controlling your carbohydrate and sugar intake is recommended to manage diabetes.  Take any prescribed medicines to control diabetes as directed  by your health care provider.  Control your hypertension.  Making food choices that are low in salt (sodium), saturated fat, trans fat, and cholesterol is recommended to manage hypertension.  Ask your health care provider if you need treatment to lower your blood pressure. Take any prescribed medicines to control hypertension as directed by your health care provider.  If you are 40-23 years of age, have your blood pressure checked every 3-5 years. If you are 60 years of age or older, have your blood pressure checked every year.  Maintain a healthy weight.  Reducing calorie intake and making food choices that are low in sodium, saturated fat, trans fat, and cholesterol are recommended to manage weight.  Stop drug abuse.  Avoid taking birth control pills.  Talk to your health care provider about the risks of taking birth control pills if you are over 60 years old, smoke, get migraines, or have ever had a blood clot.  Get evaluated for sleep disorders (sleep apnea).  Talk to your health care provider about getting a sleep evaluation if you snore a lot or have excessive sleepiness.  Take medicines only as directed by your health care provider.  For some people, aspirin or blood thinners (anticoagulants) are helpful in reducing the risk of forming abnormal blood clots that can lead to stroke. If you have the irregular heart rhythm of atrial fibrillation, you should be on a blood thinner unless there is a good reason you cannot take them.  Understand all your medicine instructions.  Make sure that other conditions (such as anemia or atherosclerosis) are addressed. Get help right away if:  You have  sudden weakness or numbness of the face, arm, or leg, especially on one side of the body.  Your face or eyelid droops to one side.  You have sudden confusion.  You have trouble speaking (aphasia) or understanding.  You have sudden trouble seeing in one or both eyes.  You have sudden  trouble walking.  You have dizziness.  You have a loss of balance or coordination.  You have a sudden, severe headache with no known cause.  You have new chest pain or an irregular heartbeat. Any of these symptoms may represent a serious problem that is an emergency. Do not wait to see if the symptoms will go away. Get medical help at once. Call your local emergency services (911 in [place] Do not drive yourself to the hospital. This information is not intended to replace advice given to you by your health care provider. Make sure you discuss any questions you have with your health care provider. Document Released: 09/05/2004 Document Revised: 01/04/2016 Document Reviewed: 01/29/2013 Elsevier Interactive Patient Education  2017 [employer].

## 2016-12-04 DIAGNOSIS — M19041 Primary osteoarthritis, right hand: Secondary | ICD-10-CM | POA: Insufficient documentation

## 2016-12-04 DIAGNOSIS — M1811 Unilateral primary osteoarthritis of first carpometacarpal joint, right hand: Secondary | ICD-10-CM | POA: Insufficient documentation

## 2016-12-04 DIAGNOSIS — G5601 Carpal tunnel syndrome, right upper limb: Secondary | ICD-10-CM | POA: Insufficient documentation

## 2016-12-26 ENCOUNTER — Telehealth: Payer: Self-pay

## 2016-12-26 NOTE — Telephone Encounter (Signed)
Yvonne Parrish called back. Gave verbal clearance per work-in MD (Dr. Jaynee Eagles) that pt may continue aspirin as ordered and  proceed w/ CTS procedure as scheduled. Verbalized understanding and appreciation for call.

## 2016-12-26 NOTE — Telephone Encounter (Signed)
Rn call hand center at [phone]. Rn ask for lynne but she was unavailable. Rn left message that Dr. Leonie Man is out of the country till Jan 06, 2017. Rn stated typically if a patient needs to have surgery, procedure, they need to wait 29m months unless its a urgent request. Pt had stroke in January 2018. Rn left contact number to return phone call.

## 2017-01-03 ENCOUNTER — Ambulatory Visit (HOSPITAL_BASED_OUTPATIENT_CLINIC_OR_DEPARTMENT_OTHER): Admit: 2017-01-03 | Payer: Medicare Other | Admitting: Orthopedic Surgery

## 2017-01-03 ENCOUNTER — Encounter (HOSPITAL_BASED_OUTPATIENT_CLINIC_OR_DEPARTMENT_OTHER): Payer: Self-pay

## 2017-01-03 SURGERY — CARPAL TUNNEL RELEASE
Anesthesia: Regional | Laterality: Right

## 2017-01-13 NOTE — Progress Notes (Signed)
01/14/2017 11:57 AM   Yvonne Parrish 10/17/1928 [id number]  Referring provider: Tracie Harrier, MD [address] [facility] [place], [place] [address]  Chief Complaint  Patient presents with  . New Patient (Initial Visit)    Hematuria referred by Dr. Ginette Pitman    HPI: Patient is a 83 -year-old Caucasian female who presents today as a referral from their PCP, Dr. Ginette Pitman, for microscopic hematuria with her daughter, [name].    Patient was found to have microscopic hematuria on 12/09/2016 and 03/21/2016 with 4-10 RBC's/hpf.  Patient doesn't have a prior history of microscopic hematuria.    She does not have a prior history of recurrent urinary tract infections, nephrolithiasis, trauma to the genitourinary tract or malignancies of the genitourinary tract.   She does not have a family medical history of nephrolithiasis, malignancies of the genitourinary tract or hematuria.   Today, she is having symptoms of frequent urination x 6, urgency and nocturia x 3-4.  Her UA today was unremarkable.  She is not experiencing any suprapubic pain, abdominal pain or flank pain.   She denies any recent fevers, chills, nausea or vomiting.   She has not had any recent imaging studies.   She is not a smoker.  She has a remote history of exposure to secondhand smoke.  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   She has HTN.        PMH: Past Medical History:  Diagnosis Date  . Arthritis   . Asthma in child    question of interstitial lung disease  . CHF (congestive heart failure) ([place])   . Chronic back pain   . Chronic pain of right knee    for past 30 years  . Heart murmur   . Heartburn   . History of right foot drop   . Hypertension   . Hypothyroidism   . Myelodysplastic syndrome ([place])   . Stroke (cerebrum) ([place]) 08/2016    Surgical History: Past Surgical History:  Procedure Laterality Date  . CORONARY ANGIOPLASTY    . CORONARY ARTERY BYPASS  GRAFT  1999  . KNEE ARTHROSCOPY    . knee fracture     Right knee pinning in '90's   . LUMBAR SPINE SURGERY    . TONSILLECTOMY      Home Medications:  Allergies as of 01/14/2017   No Known Allergies     Medication List       Accurate as of 01/14/17 11:57 AM. Always use your most recent med list.          albuterol 108 (90 Base) MCG/ACT inhaler Commonly known as:  PROVENTIL HFA;VENTOLIN HFA Inhale 1-2 puffs into the lungs every 4 (four) hours as needed for wheezing.   amLODipine 5 MG tablet Commonly known as:  NORVASC Take 1 tablet (5 mg total) by mouth daily.   aspirin EC 325 MG tablet Take by mouth.   ASTELIN 0.1 % nasal spray Generic drug:  azelastine Place 2 sprays into both nostrils daily as needed for rhinitis or allergies.   atorvastatin 20 MG tablet Commonly known as:  LIPITOR Take 1 tablet (20 mg total) by mouth daily at 6 PM.   Calcium Carbonate-Vitamin D 600-400 MG-UNIT tablet Take by mouth.   cyclobenzaprine 10 MG tablet Commonly known as:  FLEXERIL Take 0.5 tablets (5 mg total) by mouth 3 (three) times daily as needed for muscle spasms. MORNING AND BEDTIME   famotidine 20 MG tablet Commonly known as:  PEPCID Take 1 tablet (20 mg total) by mouth daily.   FLUoxetine 10 MG capsule Commonly known as:  PROZAC Take 1 capsule (10 mg total) by mouth daily.   hydrochlorothiazide 25 MG tablet Commonly known as:  HYDRODIURIL Take by mouth.   levothyroxine 88 MCG tablet Commonly known as:  SYNTHROID, LEVOTHROID Take 88 mcg by mouth daily before breakfast.   lisinopril 40 MG tablet Commonly known as:  PRINIVIL,ZESTRIL Take 1 tablet (40 mg total) by mouth daily.   loratadine 10 MG tablet Commonly known as:  CLARITIN Take 10 mg by mouth daily.   metoprolol succinate 50 MG 24 hr tablet Commonly known as:  TOPROL-XL Take 1 tablet (50 mg total) by mouth daily.   NONFORMULARY OR COMPOUNDED ITEM Anti inflammatory   polyethylene glycol powder  powder Commonly known as:  GLYCOLAX/MIRALAX Take 17 g by mouth daily.   potassium chloride 10 MEQ tablet Commonly known as:  K-DUR,KLOR-CON Take by mouth.   traMADol 50 MG tablet Commonly known as:  ULTRAM Take by mouth.   Vitamin D-3 1000 units Caps Take 1,000 Units by mouth daily.       Allergies: No Known Allergies  Family History: Family History  Problem Relation Age of Onset  . Stroke Mother   . Heart disease Father   . Kidney cancer Neg Hx   . Bladder Cancer Neg Hx   . Kidney disease Neg Hx     Social History:  reports that she has never smoked. She has never used smokeless tobacco. She reports that she does not drink alcohol or use drugs.  ROS: UROLOGY Frequent Urination?: Yes Hard to postpone urination?: Yes Burning/pain with urination?: No Get up at night to urinate?: Yes Leakage of urine?: No Urine stream starts and stops?: No Trouble starting stream?: No Do you have to strain to urinate?: No Blood in urine?: No Urinary tract infection?: No Sexually transmitted disease?: No Injury to kidneys or bladder?: No Painful intercourse?: No Weak stream?: No Currently pregnant?: No Vaginal bleeding?: No Last menstrual period?: n  Gastrointestinal Nausea?: No Vomiting?: No Indigestion/heartburn?: Yes Diarrhea?: No Constipation?: Yes  Constitutional Fever: No Night sweats?: No Weight loss?: No Fatigue?: Yes  Skin Skin rash/lesions?: No Itching?: No  Eyes Blurred vision?: Yes Double vision?: No  Ears/Nose/Throat Sore throat?: No Sinus problems?: Yes  Hematologic/Lymphatic Swollen glands?: No Easy bruising?: No  Cardiovascular Leg swelling?: Yes Chest pain?: No  Respiratory Cough?: No Shortness of breath?: Yes  Endocrine Excessive thirst?: No  Musculoskeletal Back pain?: Yes Joint pain?: No  Neurological Headaches?: No Dizziness?: No  Psychologic Depression?: No Anxiety?: No  Physical Exam: BP (!) 145/72   Pulse (!)  56   Ht 5\' 3"  (1.6 m)   Wt 152 lb 12.8 oz (69.3 kg)   BMI 27.07 kg/m   Constitutional: Well nourished. Alert and oriented, No acute distress. HEENT: [place] AT, moist mucus membranes. Trachea midline, no masses. Cardiovascular: No clubbing, cyanosis, or edema. Respiratory: Normal respiratory effort, no increased work of breathing. GI: Abdomen is soft, non tender, non distended, no abdominal masses. Liver and spleen not palpable.  No hernias appreciated.  Stool sample for occult testing is not indicated.   GU: No CVA tenderness.  No bladder fullness or masses.  Atrophic external genitalia, normal pubic hair distribution, no lesions.  Normal urethral meatus, no lesions, no prolapse, no discharge.   No urethral masses, tenderness and/or tenderness. No bladder fullness, tenderness or masses. Pale vagina mucosa, poor estrogen effect, no discharge,  no lesions, good pelvic support, no cystocele or rectocele noted.  No cervical motion tenderness.  Uterus is freely mobile and non-fixed.  No adnexal/parametria masses or tenderness noted.  Anus and perineum are without rashes or lesions.    Skin: No rashes, bruises or suspicious lesions. Lymph: No cervical or inguinal adenopathy. Neurologic: Grossly intact, no focal deficits, moving all 4 extremities. Psychiatric: Normal mood and affect.  Laboratory Data: Lab Results  Component Value Date   WBC 7.0 09/05/2016   HGB 10.5 (L) 09/05/2016   HCT 32.0 (L) 09/05/2016   MCV 99.1 09/05/2016   PLT 252 09/05/2016    Lab Results  Component Value Date   CREATININE 0.83 09/05/2016     Lab Results  Component Value Date   HGBA1C 6.0 (H) 08/21/2016       Component Value Date/Time   CHOL 168 08/21/2016 0628   HDL 57 08/21/2016 0628   CHOLHDL 2.9 08/21/2016 0628   VLDL 17 08/21/2016 0628   LDLCALC 94 08/21/2016 0628    Lab Results  Component Value Date   AST 19 08/23/2016   Lab Results  Component Value Date   ALT 10 (L) 08/23/2016      Urinalysis Unremarkable.   See EPIC.    Assessment & Plan:    1. Microscopic hematuria  - I explained to the patient that there are a number of causes that can be associated with blood in the urine, such as stones, UTI's, damage to the urinary tract and/or cancer.  - At this time, I felt that the patient warranted further urologic evaluation.   The AUA guidelines state that a CT urogram is the preferred imaging study to evaluate hematuria.  - I explained to the patient that a contrast material will be injected into a vein and that in rare instances, an allergic reaction can result and may even life threatening   The patient denies any allergies to contrast, iodine and/or seafood and is not taking metformin  - Her reproductive status is postmenopausal  - Following the imaging study,  I've recommended a cystoscopy. I described how this is performed, typically in an office setting with a flexible cystoscope. We described the risks, benefits, and possible side effects, the most common of which is a minor amount of blood in the urine and/or burning which usually resolves in 24 to 48 hours.    - The patient had the opportunity to ask questions which were answered. Based upon this discussion, the patient is willing to proceed. Therefore, I've ordered: a CT Urogram and cystoscopy.  - The patient will return following all of the above for discussion of the results.   - UA  - Urine culture  - BUN + creatinine      Return for CT Urogram report and cystoscopy.  These notes generated with voice recognition software. I apologize for typographical errors.  Zara Council, [facility] [address], [place], [place] [address] [phone]

## 2017-01-14 ENCOUNTER — Encounter: Payer: Self-pay | Admitting: Urology

## 2017-01-14 ENCOUNTER — Ambulatory Visit (INDEPENDENT_AMBULATORY_CARE_PROVIDER_SITE_OTHER): Payer: Medicare Other | Admitting: Urology

## 2017-01-14 VITALS — BP 145/72 | HR 56 | Ht 63.0 in | Wt 152.8 lb

## 2017-01-14 DIAGNOSIS — R3129 Other microscopic hematuria: Secondary | ICD-10-CM | POA: Diagnosis not present

## 2017-01-14 LAB — MICROSCOPIC EXAMINATION: Bacteria, UA: NONE SEEN

## 2017-01-14 LAB — URINALYSIS, COMPLETE
Bilirubin, UA: NEGATIVE
Glucose, UA: NEGATIVE
Ketones, UA: NEGATIVE
Leukocytes, UA: NEGATIVE
Nitrite, UA: NEGATIVE
Specific Gravity, UA: 1.01 (ref 1.005–1.030)
Urobilinogen, Ur: 0.2 mg/dL (ref 0.2–1.0)
pH, UA: 7.5 (ref 5.0–7.5)

## 2017-01-14 NOTE — Patient Instructions (Addendum)
CT Scan A CT scan is a kind of X-ray. A CT scan makes pictures of the inside of your body. In this procedure, the pictures will be taken in a large machine that has an opening (CT scanner). What happens before the procedure? Staying hydrated Follow instructions from your doctor about hydration, which may include:  Up to 2 hours before the procedure - you may continue to drink clear liquids. These include water, clear fruit juice, black coffee, and plain tea.  Eating and drinking restrictions Follow instructions from your doctor about eating and drinking, which may include:  24 hours before the procedure - stop drinking caffeinated drinks. These include energy drinks, tea, soda, coffee, and hot chocolate.  8 hours before the procedure - stop eating heavy meals or foods. These include meat, fried foods, or fatty foods.  6 hours before the procedure - stop eating light meals or foods. These include toast or cereal.  6 hours before the procedure - stop drinking milk or drinks that have milk in them.  2 hours before the procedure - stop drinking clear liquids.  General instructions  Take off any jewelry.  Ask your doctor about changing or stopping your normal medicines. This is important if you take diabetes medicines or blood thinners. What happens during the procedure?  You will lie on a table with your arms above your head.  An IV tube may be put into one of your veins.  Dye may be put into the IV tube. You may feel warm or have a metal taste in your mouth.  The table you will be lying on will move into the CT scanner.  You will be able to see, hear, and talk to the person who is running the machine while you are in it. Follow that person's directions.  The machine will move around you to take pictures. Do not move.  When the machine is done taking pictures, it will be turned off.  The table will be moved out of the machine.  Your IV tube will be taken out. The procedure  may vary among doctors and hospitals. What happens after the procedure?  It is up to you to get your results. Ask when your results will be ready. Summary  A CT scan is a kind of X-ray.  A CT scan makes pictures of the inside of your body.  Follow instructions from your doctor about eating and drinking before the procedure.  You will be able to see, hear, and talk to the person who is running the machine while you are in it. Follow that person's directions. This information is not intended to replace advice given to you by your health care provider. Make sure you discuss any questions you have with your health care provider. Document Released: 10/25/2008 Document Revised: 08/15/2016 Document Reviewed: 08/15/2016 Elsevier Interactive Patient Education  2017 [employer]  Hematuria, Adult Hematuria is blood in your urine. It can be caused by a bladder infection, kidney infection, prostate infection, kidney stone, or cancer of your urinary tract. Infections can usually be treated with medicine, and a kidney stone usually will pass through your urine. If neither of these is the cause of your hematuria, further workup to find out the reason may be needed. It is very important that you tell your health care provider about any blood you see in your urine, even if the blood stops without treatment or happens without causing pain. Blood in your urine that happens and then stops and  then happens again can be a symptom of a very serious condition. Also, pain is not a symptom in the initial stages of many urinary cancers. Follow these instructions at home:  Drink lots of fluid, 3-4 quarts a day. If you have been diagnosed with an infection, cranberry juice is especially recommended, in addition to large amounts of water.  Avoid caffeine, tea, and carbonated beverages because they tend to irritate the bladder.  Avoid alcohol because it may irritate the prostate.  Take all medicines as directed by  your health care provider.  If you were prescribed an antibiotic medicine, finish it all even if you start to feel better.  If you have been diagnosed with a kidney stone, follow your health care provider's instructions regarding straining your urine to catch the stone.  Empty your bladder often. Avoid holding urine for long periods of time.  After a bowel movement, women should cleanse front to back. Use each tissue only once.  Empty your bladder before and after sexual intercourse if you are a female. Contact a health care provider if:  You develop back pain.  You have a fever.  You have a feeling of sickness in your stomach (nausea) or vomiting.  Your symptoms are not better in 3 days. Return sooner if you are getting worse. Get help right away if:  You develop severe vomiting and are unable to keep the medicine down.  You develop severe back or abdominal pain despite taking your medicines.  You begin passing a large amount of blood or clots in your urine.  You feel extremely weak or faint, or you pass out. This information is not intended to replace advice given to you by your health care provider. Make sure you discuss any questions you have with your health care provider. Document Released: 07/29/2005 Document Revised: 01/04/2016 Document Reviewed: 03/29/2013 Elsevier Interactive Patient Education  2017 [employer].  Cystoscopy Cystoscopy is a procedure that is used to help diagnose and sometimes treat conditions that affect that lower urinary tract. The lower urinary tract includes the bladder and the tube that drains urine from the bladder out of the body (urethra). Cystoscopy is performed with a thin, tube-shaped instrument with a light and camera at the end (cystoscope). The cystoscope may be hard (rigid) or flexible, depending on the goal of the procedure.The cystoscope is inserted through the urethra, into the bladder. Cystoscopy may be recommended if you  have:  Urinary tractinfections that keep coming back (recurring).  Blood in the urine (hematuria).  Loss of bladder control (urinary incontinence) or an overactive bladder.  Unusual cells found in a urine sample.  A blockage in the urethra.  Painful urination.  An abnormality in the bladder found during an intravenous pyelogram (IVP) or CT scan.  Cystoscopy may also be done to remove a sample of tissue to be examined under a microscope (biopsy). Tell a health care provider about:  Any allergies you have.  All medicines you are taking, including vitamins, herbs, eye drops, creams, and over-the-counter medicines.  Any problems you or family members have had with anesthetic medicines.  Any blood disorders you have.  Any surgeries you have had.  Any medical conditions you have.  Whether you are pregnant or may be pregnant. What are the risks? Generally, this is a safe procedure. However, problems may occur, including:  Infection.  Bleeding.  Allergic reactions to medicines.  Damage to other structures or organs.  What happens before the procedure?  Ask your health  care provider about: ? Changing or stopping your regular medicines. This is especially important if you are taking diabetes medicines or blood thinners. ? Taking medicines such as aspirin and ibuprofen. These medicines can thin your blood. Do not take these medicines before your procedure if your health care provider instructs you not to.  Follow instructions from your health care provider about eating or drinking restrictions.  You may be given antibiotic medicine to help prevent infection.  You may have an exam or testing, such as X-rays of the bladder, urethra, or kidneys.  You may have urine tests to check for signs of infection.  Plan to have someone take you home after the procedure. What happens during the procedure?  To reduce your risk of infection,your health care team will wash or sanitize  their hands.  You will be given one or more of the following: ? A medicine to help you relax (sedative). ? A medicine to numb the area (local anesthetic).  The area around the opening of your urethra will be cleaned.  The cystoscope will be passed through your urethra into your bladder.  Germ-free (sterile)fluid will flow through the cystoscope to fill your bladder. The fluid will stretch your bladder so that your surgeon can clearly examine your bladder walls.  The cystoscope will be removed and your bladder will be emptied. The procedure may vary among health care providers and hospitals. What happens after the procedure?  You may have some soreness or pain in your abdomen and urethra. Medicines will be available to help you.  You may have some blood in your urine.  Do not drive for 24 hours if you received a sedative. This information is not intended to replace advice given to you by your health care provider. Make sure you discuss any questions you have with your health care provider. Document Released: 07/26/2000 Document Revised: 12/07/2015 Document Reviewed: 06/15/2015 Elsevier Interactive Patient Education  2017 [employer].

## 2017-01-15 LAB — BUN+CREAT
BUN/Creatinine Ratio: 13 (ref 12–28)
BUN: 11 mg/dL (ref 8–27)
Creatinine, Ser: 0.87 mg/dL (ref 0.57–1.00)
GFR calc Af Amer: 70 mL/min/{1.73_m2} (ref >59)
GFR calc non Af Amer: 61 mL/min/{1.73_m2} (ref >59)

## 2017-01-16 LAB — CULTURE, URINE COMPREHENSIVE

## 2017-02-05 ENCOUNTER — Ambulatory Visit
Admission: RE | Admit: 2017-02-05 | Discharge: 2017-02-05 | Disposition: A | Discharge status: Home / Self Care | Payer: Medicare Other | Source: Ambulatory Visit | Attending: Urology | Admitting: Urology

## 2017-02-05 DIAGNOSIS — M47816 Spondylosis without myelopathy or radiculopathy, lumbar region: Secondary | ICD-10-CM | POA: Insufficient documentation

## 2017-02-05 DIAGNOSIS — R918 Other nonspecific abnormal finding of lung field: Secondary | ICD-10-CM | POA: Insufficient documentation

## 2017-02-05 DIAGNOSIS — K449 Diaphragmatic hernia without obstruction or gangrene: Secondary | ICD-10-CM | POA: Diagnosis not present

## 2017-02-05 DIAGNOSIS — I700 Atherosclerosis of aorta: Secondary | ICD-10-CM | POA: Insufficient documentation

## 2017-02-05 DIAGNOSIS — R3129 Other microscopic hematuria: Secondary | ICD-10-CM | POA: Diagnosis present

## 2017-02-05 DIAGNOSIS — M5136 Other intervertebral disc degeneration, lumbar region: Secondary | ICD-10-CM | POA: Insufficient documentation

## 2017-02-05 MED ORDER — IOPAMIDOL (ISOVUE-300) INJECTION 61%
125.0000 mL | Freq: Once | INTRAVENOUS | Status: AC | PRN
  Administered 2017-02-05: 125 mL via INTRAVENOUS

## 2017-02-14 ENCOUNTER — Ambulatory Visit (INDEPENDENT_AMBULATORY_CARE_PROVIDER_SITE_OTHER): Payer: Medicare Other | Admitting: Urology

## 2017-02-14 ENCOUNTER — Encounter: Payer: Self-pay | Admitting: Urology

## 2017-02-14 VITALS — BP 152/72 | HR 66 | Ht 63.0 in | Wt 153.5 lb

## 2017-02-14 DIAGNOSIS — R3129 Other microscopic hematuria: Secondary | ICD-10-CM

## 2017-02-14 LAB — MICROSCOPIC EXAMINATION
Bacteria, UA: NONE SEEN
Epithelial Cells (non renal): NONE SEEN /hpf (ref 0–10)

## 2017-02-14 LAB — URINALYSIS, COMPLETE
Bilirubin, UA: NEGATIVE
Glucose, UA: NEGATIVE
Ketones, UA: NEGATIVE
Nitrite, UA: NEGATIVE
Protein, UA: NEGATIVE
Specific Gravity, UA: 1.01 (ref 1.005–1.030)
Urobilinogen, Ur: 0.2 mg/dL (ref 0.2–1.0)
pH, UA: 5.5 (ref 5.0–7.5)

## 2017-02-14 MED ORDER — CIPROFLOXACIN HCL 500 MG PO TABS
500.0000 mg | ORAL_TABLET | Freq: Once | ORAL | Status: AC
  Administered 2017-02-14: 500 mg via ORAL

## 2017-02-14 MED ORDER — LIDOCAINE HCL 2 % EX GEL
1.0000 "application " | Freq: Once | CUTANEOUS | Status: AC
  Administered 2017-02-14: 1 via URETHRAL

## 2017-02-14 NOTE — Progress Notes (Signed)
   02/14/17  CC:  Chief Complaint  Patient presents with  . Cysto    HPI: The patient is a 83-year-old female presents for completion of her microscopic hematuria workup. Her CT urogram was unremarkable for source of hematuria.  There were no vitals taken for this visit. NED. A&Ox3.   No respiratory distress   Abd soft, NT, ND Normal external genitalia with patent urethral meatus  Cystoscopy Procedure Note  Patient identification was confirmed, informed consent was obtained, and patient was prepped using Betadine solution.  Lidocaine jelly was administered per urethral meatus.    Preoperative abx where received prior to procedure.    Procedure: - Flexible cystoscope introduced, without any difficulty.   - Thorough search of the bladder revealed:    normal urethral meatus    normal urothelium    no stones    no ulcers     no tumors    no urethral polyps    no trabeculation  - Ureteral orifices were normal in position and appearance.  Post-Procedure: - Patient tolerated the procedure well  Assessment/ Plan:  1. Microscopic hematuria -negative workup. Given the patient's age, I do not think she needs any further assessment after negative microscopic hematuria workup unless she develops gross hematuria. She'll follow-up with [place] as needed.   Nickie Retort, MD

## 2017-05-05 ENCOUNTER — Ambulatory Visit (INDEPENDENT_AMBULATORY_CARE_PROVIDER_SITE_OTHER): Payer: Medicare Other | Admitting: Neurology

## 2017-05-05 ENCOUNTER — Encounter: Payer: Self-pay | Admitting: Neurology

## 2017-05-05 VITALS — BP 134/67 | HR 62 | Wt 151.6 lb

## 2017-05-05 DIAGNOSIS — I6990 Unspecified sequelae of unspecified cerebrovascular disease: Secondary | ICD-10-CM | POA: Diagnosis not present

## 2017-05-05 NOTE — Progress Notes (Signed)
[facility] [address] [place]. [place] [address] [phone]       OFFICE FOLLOW-UP NOTE  [name] Date of Birth:  12/04/1928 Medical Record Number:  [id number]   HPI: 83 year lady seen today for the first office follow-up visit following hospital admission in January 2018 for strokes. She is accompanied by daughter. History is up 10 from the patient, daughter and review of hospital medical records. I personally reviewed imaging films.Yvonne R Jonesis an 83 yo female with hx of HTN and CHF presents for wake up stroke an symptoms of difficulty speaking and R sided weakness. Last known normal was 08/19/16 evening. Patient was not administered IV t-PA secondary to delay in arrival. She was admitted for further evaluation and treatment. MRI scan of the brain showed a small left paracentral pontine and left caudate head lacunar infarct. CT angiogram of the brain and neck showed no large vessel intracranial or extracranial stenosis. CT perfusion scan showed no significant mismatch. There is 3.9 cm dilatation of ascending thoracic aorta. Carotid ultrasound showed no significant extracranial stenosis. Transthoracic echo showed normal ejection fraction. LDL cholesterol was 94 mg percent. Hemoglobin A1c was 6.0. Patient was not on aspirin 81 mg prior to admission this was switched to Aggrenox. Patient was transferred to inpatient rehabilitation. She did well and has been discharged home. She is finished PHYSICAL and occupation therapy. She is able to walk with a foot brace for her chronic right foot drop from lumbar radiculopathy. She uses a beachchair for long distances. She still has some weakness in the right hand and diminished fine motor skills. His speech and in has improved. She is tolerating Lipitor well without side effects and plans to have lipid profile checked next month by primary physician. Patient was unable to afford Aggrenox and has switched to aspirin to 25 mg daily  which is tolerating well without bruising or bleeding. Update 05/05/2017 ; she returns for follow-up after last visit 6 months ago. She is accompanied by her daughter [name]. Patient continues to do well. She has had no recurrent stroke or TIA symptoms. She continues to have walking difficulty due to the right foot drop and uses a beta blocker. She is careful and has had no falls or injuries. She remains on aspirin which is tolerating well without upset stomach or bruising or bleeding. Her blood pressure is under good control and today it is 134/67. She remains on Lipitor 20 mg which is tolerating well without muscle aches or pains. She had lipid profile checked 2 weeks ago by primary physician and was satisfactory. She has an upcoming appointment to see her cardiologist next month. She did denies any new health problems except she had surgery for carpal tunnel in the right hand in May 2018. Patient and daughter feels that she is on too many medications and they're trying to reduce her medicines. The patient was started on Prozac for depression following a stroke in January but both patient and daughter feels she is no longer depressed and would like to come off the Prozac if possible  ROS:   14 system review of systems is positive for shortness of breath, walking difficulty, right foot drop and all other systems negative and all other systems negative PMH:  Past Medical History:  Diagnosis Date  . Arthritis   . Asthma in child    question of interstitial lung disease  . CHF (congestive heart failure) ([place])   . Chronic back pain   .  Chronic pain of right knee    for past 30 years  . Heart murmur   . Heartburn   . History of right foot drop   . Hypertension   . Hypothyroidism   . Myelodysplastic syndrome ([place])   . Stroke (cerebrum) ([place]) 08/2016    Social History:  Social History   Social History  . Marital status: Widowed    Spouse name: N/A  . Number of children: N/A  . Years of  education: N/A   Occupational History  . Not on file.   Social History Main Topics  . Smoking status: Never Smoker  . Smokeless tobacco: Never Used  . Alcohol use No  . Drug use: No  . Sexual activity: No   Other Topics Concern  . Not on file   Social History Narrative  . No narrative on file    Medications:   Current Outpatient Prescriptions on File Prior to Visit  Medication Sig Dispense Refill  . albuterol (PROVENTIL HFA;VENTOLIN HFA) 108 (90 Base) MCG/ACT inhaler Inhale 1-2 puffs into the lungs every 4 (four) hours as needed for wheezing.    Marland Kitchen amLODipine (NORVASC) 5 MG tablet Take 1 tablet (5 mg total) by mouth daily. 30 tablet 0  . aspirin EC 325 MG tablet Take by mouth.    Marland Kitchen atorvastatin (LIPITOR) 20 MG tablet Take 1 tablet (20 mg total) by mouth daily at 6 PM. 30 tablet 0  . azelastine (ASTELIN) 0.1 % nasal spray Place 2 sprays into both nostrils daily as needed for rhinitis or allergies.     . Calcium Carbonate-Vitamin D 600-400 MG-UNIT tablet Take by mouth.    . Cholecalciferol (VITAMIN D-3) 1000 units CAPS Take 1,000 Units by mouth daily.    . cyclobenzaprine (FLEXERIL) 10 MG tablet Take 0.5 tablets (5 mg total) by mouth 3 (three) times daily as needed for muscle spasms. MORNING AND BEDTIME 10 tablet 0  . FLUoxetine (PROZAC) 10 MG capsule Take 1 capsule (10 mg total) by mouth daily. 30 capsule 0  . hydrochlorothiazide (HYDRODIURIL) 25 MG tablet Take by mouth.    . levothyroxine (SYNTHROID, LEVOTHROID) 88 MCG tablet Take 88 mcg by mouth daily before breakfast.    . lisinopril (PRINIVIL,ZESTRIL) 40 MG tablet Take 1 tablet (40 mg total) by mouth daily. 30 tablet 0  . loratadine (CLARITIN) 10 MG tablet Take 10 mg by mouth daily.    . metoprolol succinate (TOPROL-XL) 50 MG 24 hr tablet Take 1 tablet (50 mg total) by mouth daily. (Patient taking differently: Take 25 mg by mouth daily. ) 30 tablet 0  . NONFORMULARY OR COMPOUNDED ITEM Anti inflammatory    . polyethylene glycol  powder (GLYCOLAX/MIRALAX) powder Take 17 g by mouth daily.    . potassium chloride (K-DUR,KLOR-CON) 10 MEQ tablet Take by mouth.    . traMADol (ULTRAM) 50 MG tablet Take by mouth.     No current facility-administered medications on file prior to visit.     Allergies:  No Known Allergies  Physical Exam General: well developed, well nourished,Elderly Caucasian lady, seated, in no evident distress Head: head normocephalic and atraumatic.  Neck: supple with no carotid or supraclavicular bruits Cardiovascular: regular rate and rhythm, no murmurs Musculoskeletal: no deformity Skin:  no rash/petichiae Vascular:  Normal pulses all extremities Vitals:   05/05/17 1517  BP: 134/67  Pulse: 62   Neurologic Exam Mental Status: Awake and fully alert. Oriented to place and time. Recent and remote memory intact. Attention span, concentration and  fund of knowledge appropriate. Mood and affect appropriate.  Cranial Nerves: Fundoscopic exam reveals sharp disc margins. Pupils equal, briskly reactive to light. Extraocular movements full without nystagmus. Visual fields full to confrontation. Hearing intact. Facial sensation intact.Mild right lower facial asymmetry., tongue, palate moves normally and symmetrically.  Motor: Normal bulk and tone. Normal strength in all tested extremity muscles Except right foot drop with ankle dorsiflexors 0/5 strength. Mild weakness of right grip and intrinsic hand muscles. Fine finger movements are diminished on the right. Orbits left over right upper extremity. Sensory.: intact to touch ,pinprick .position and vibratory sensation.  Coordination: Rapid alternating movements normal in all extremities. Finger-to-nose and heel-to-shin performed accurately bilaterally. Gait and Station: Arises from chair without difficulty. Stance is normal. Gait demonstrates right foot drop with dragging of the right leg and uses a walker  Reflexes: 1+ and symmetric. Toes downgoing.        ASSESSMENT: 83 year old Caucasian lady with left pontine and basal ganglia lacunar infarct in January 2018 secondary to small vessel disease. Vascular risk factor of hypertension, hyperlipidemia, cerebrovascular disease and age. Chronic right foot drop secondary to lumbar radiculopathy from degenerative spine disease    PLAN: I had a long d/w patient about her remote lacunar strokes, right foot drop, , risk for recurrent stroke/TIAs, personally independently reviewed imaging studies and stroke evaluation results and answered questions.Continue aspirin 325 mg daily  for secondary stroke prevention and maintain strict control of hypertension with blood pressure goal below 130/90, diabetes with hemoglobin A1c goal below 6.5% and lipids with LDL cholesterol goal below 70 mg/dL. I also advised the patient to eat a healthy diet with plenty of whole grains, cereals, fruits and vegetables, exercise regularly and maintain ideal body weight. I encouraged her to use a wheeled walker at all times for fall and safety precautions. The patient feels she is not depressed and she has been taking Prozac and is taking too many medications and would like to cut back hence I recommend she reduce the Prozac to 10 mg every other day for 10 days and discontinue. If depression symptoms return she may have to go back on it. She voices understanding. Followup in the future with me in one year or call earlier if necessary. Greater than 50% of time during this 25 minute visit was spent on counseling,explanation of diagnosis of lacunar stroke, planning of further management, discussion with patient and family and coordination of care Antony Contras, MD  [facility] [address] [place], [place] [address]  Phone [phone] Fax [phone] Note: This document was prepared with digital dictation and possible smart phrase technology. Any transcriptional errors that result from this process are  unintentional

## 2017-05-05 NOTE — Patient Instructions (Signed)
I had a long d/w patient about her remote lacunar strokes, right foot drop, , risk for recurrent stroke/TIAs, personally independently reviewed imaging studies and stroke evaluation results and answered questions.Continue aspirin 325 mg daily  for secondary stroke prevention and maintain strict control of hypertension with blood pressure goal below 130/90, diabetes with hemoglobin A1c goal below 6.5% and lipids with LDL cholesterol goal below 70 mg/dL. I also advised the patient to eat a healthy diet with plenty of whole grains, cereals, fruits and vegetables, exercise regularly and maintain ideal body weight. I encouraged her to use a wheeled walker at all times for fall and safety precautions. The patient feels she is not depressed and she has been taking Prozac and is taking too many medications and would like to cut back hence I recommend she reduce the Prozac to 10 mg every other day for 10 days and discontinue. If depression symptoms return she may have to go back on it. She voices understanding. Followup in the future with me in one year or call earlier if necessary.

## 2017-06-10 DIAGNOSIS — Z8673 Personal history of transient ischemic attack (TIA), and cerebral infarction without residual deficits: Secondary | ICD-10-CM | POA: Insufficient documentation

## 2017-12-02 IMAGING — RF DG SWALLOWING FUNCTION - [facility]PT [facility]
1 series · 18 of 24 positions shown · non-contrast
Comparison: none

[Series 1: run · 12 acquisitions, 18 frames shown]
[im 1/12]
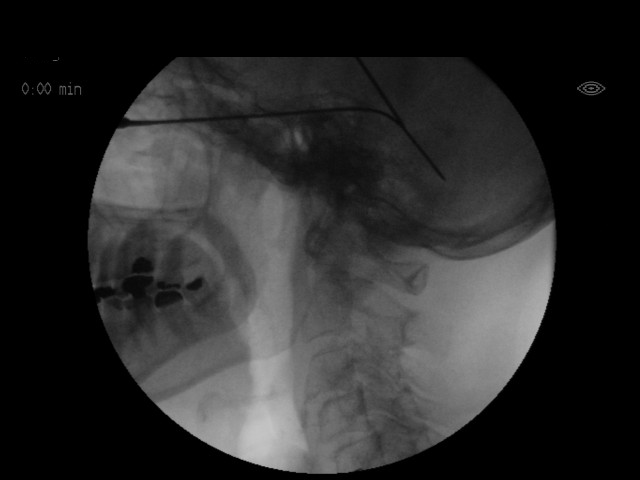
[im 2/12]
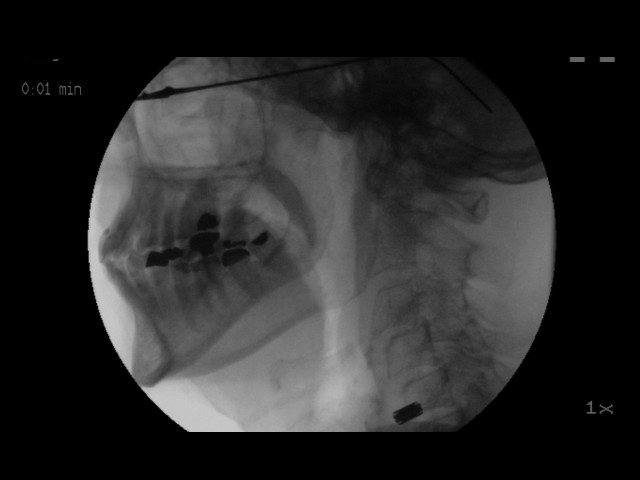
[im 2/12]
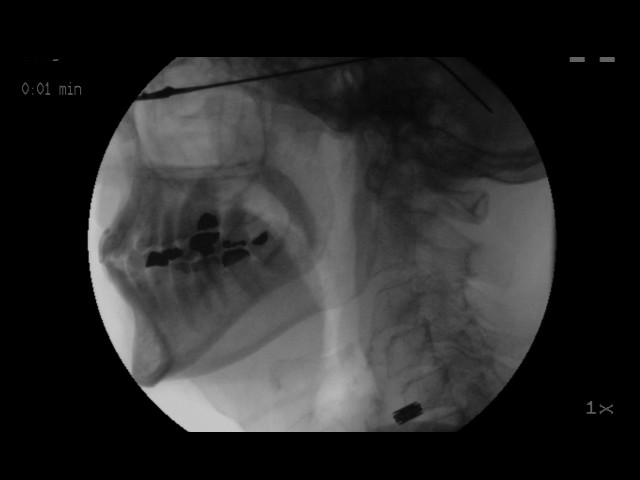
[im 3/12]
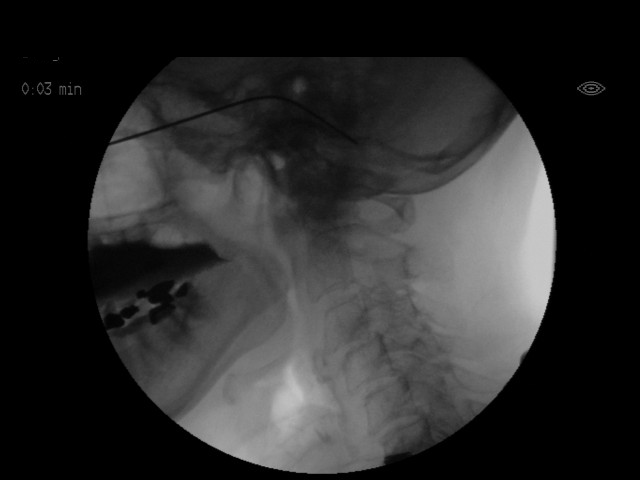
[im 4/12]
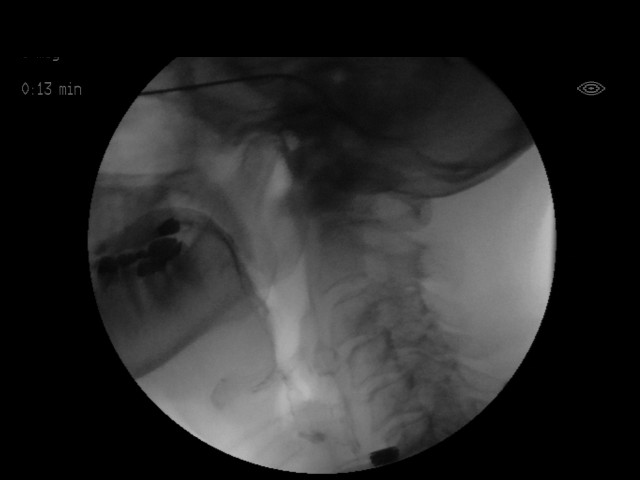
[im 4/12]
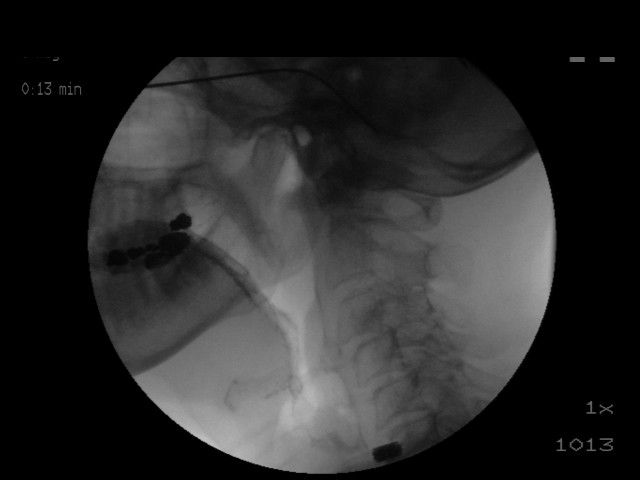
[im 5/12]
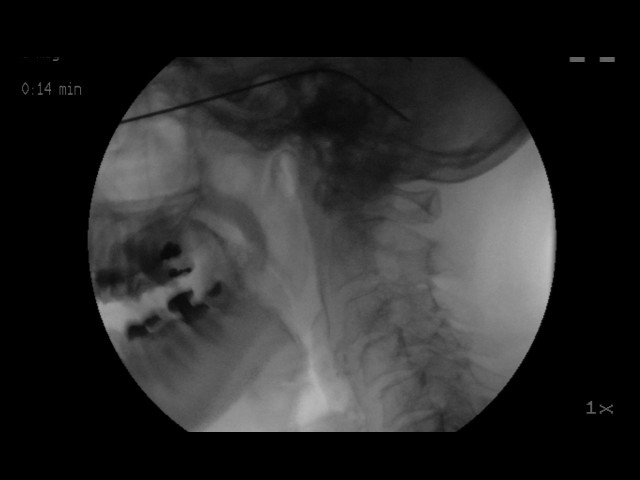
[im 6/12]
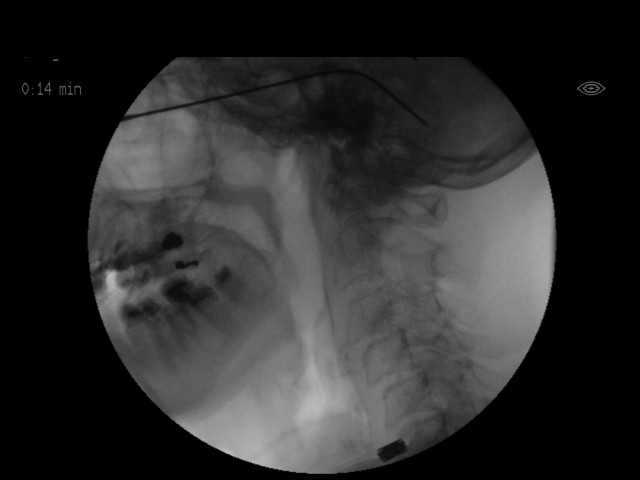
[im 6/12]
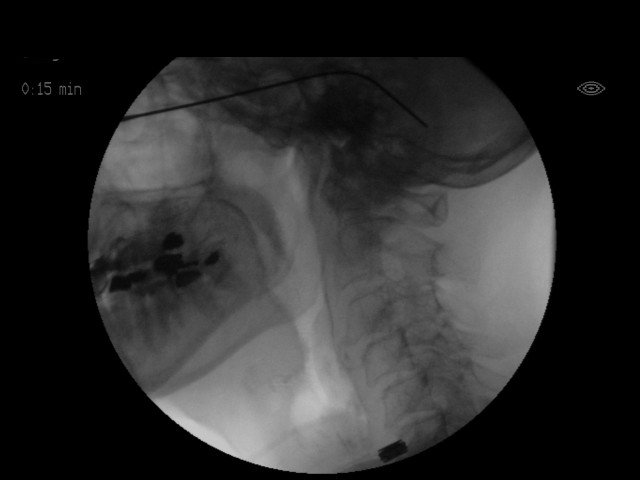
[im 7/12]
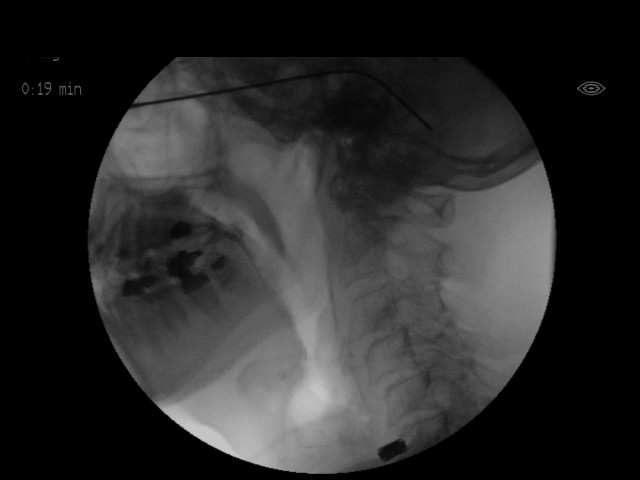
[im 8/12]
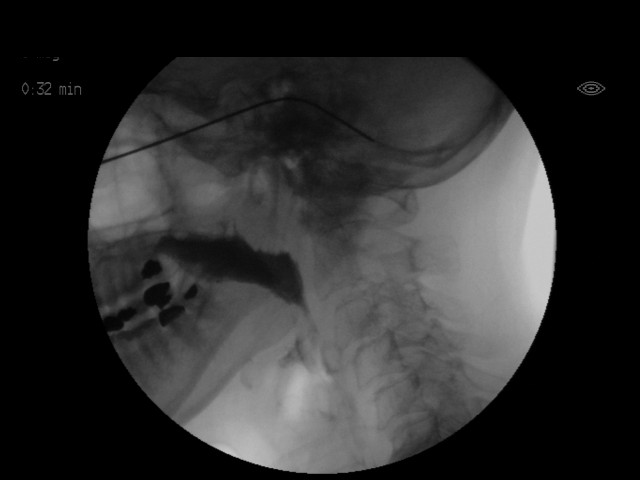
[im 8/12]
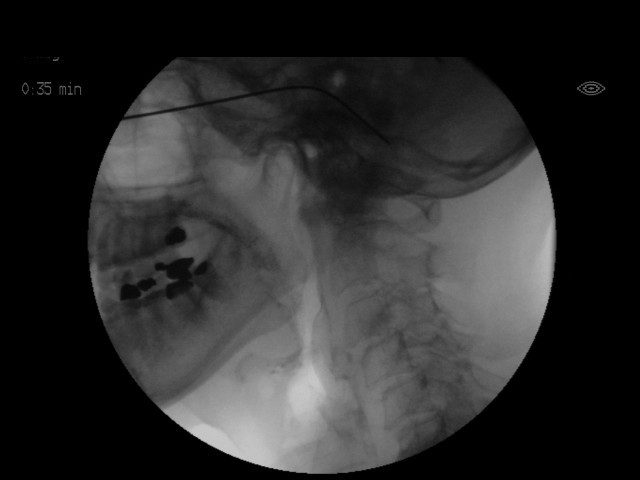
[im 9/12]
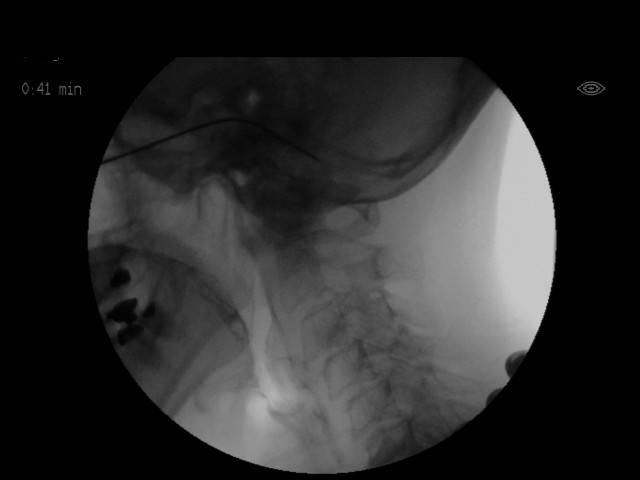
[im 10/12]
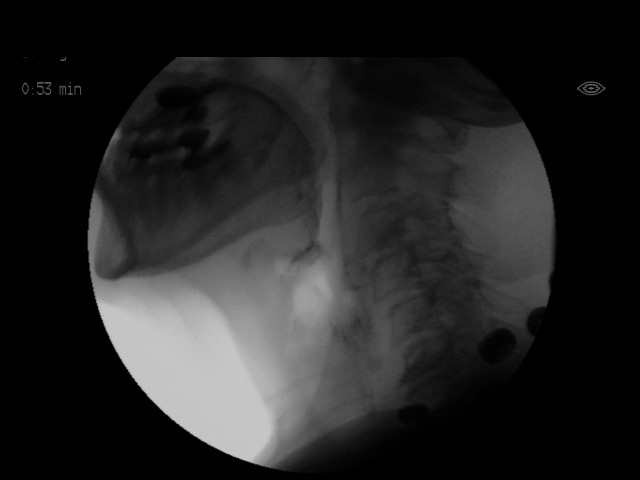
[im 10/12]
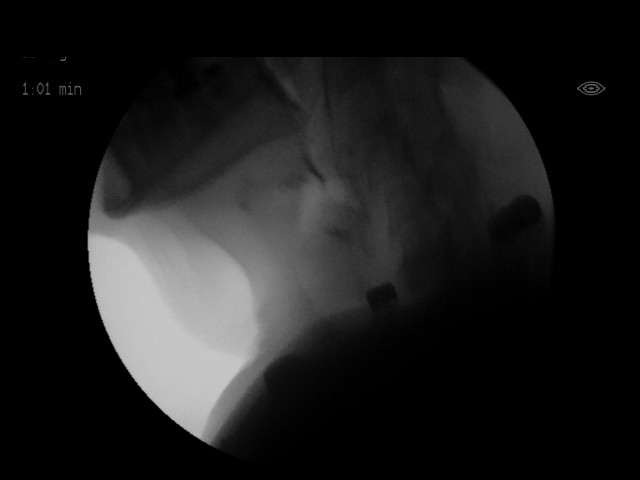
[im 11/12]
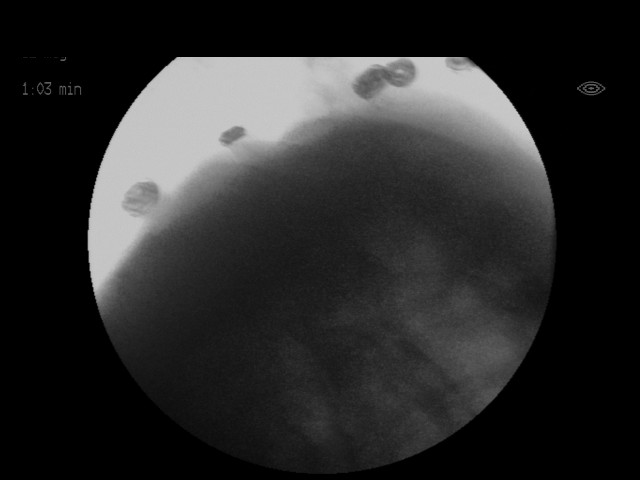
[im 12/12]
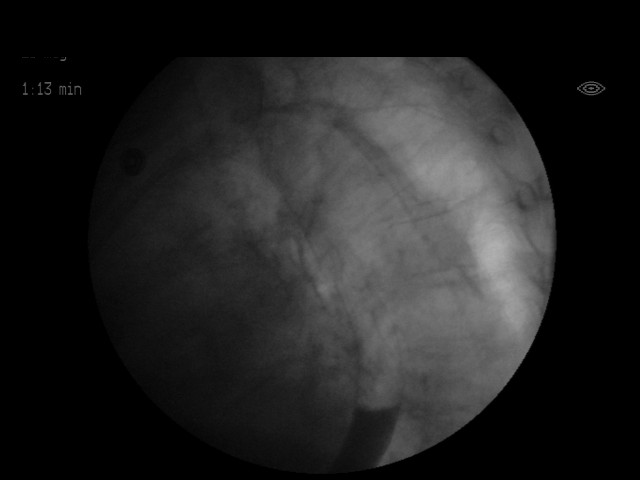
[im 12/12]
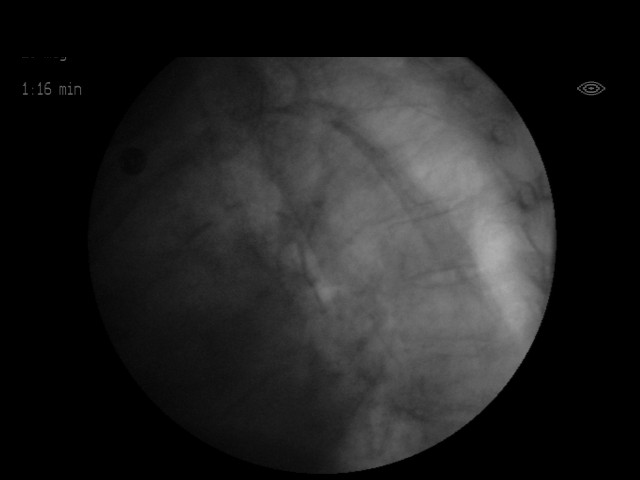

[18 of 24 positions shown; findings below may reference images not displayed]

FLUOROSCOPY FOR SWALLOWING FUNCTION STUDY:
Fluoroscopy was provided for swallowing function study, which was administered by a speech pathologist.  Final results and recommendations from this study are contained within the speech pathology report.

## 2017-12-29 ENCOUNTER — Telehealth: Payer: Self-pay | Admitting: Adult Health

## 2017-12-29 NOTE — Telephone Encounter (Signed)
Daughter Raechel Chute called office with concerns of worsening stroke deficits over the past 2 to 3 months.  Patient was doing well with a plateau in her memory issues, speech issues and expressive aphasia but overall patient was doing well.  For the past 2 to 3 months, daughter states her and her sister noticed worsening in her speech and memory but did not want to mention this to her mother they did not want to upset her.  Apparently patient saw flyer in the mail which talked about strokes and patient went to daughter with concerns of worsening memory and speech as well.  Per daughter, this is been a gradual worsening and does not seem to be getting better or plateauing.  Approximately 3 weeks ago, patient was having consistent hypotension around 80/50 so PCP decreased hydrochlorothiazide from 25 to 12.5.  Most recent pressures have been SBP in the 110-120 range.  Daughter has not noticed a difference in speech or memory since improvement in blood pressure.  Patient has been compliant with all other medications.  Denies recent falls, weakness on one side, tingling on one side or any other new symptoms.  Denies recent headaches or visual changes.  Denies recent illnesses.  Daughter apprehensive about bringing patient to hospital as this increases the patient's anxiety.  It was recommended that patient be brought to the hospital for possible evaluation of evolution of old stroke or possible new stroke but daughter apprehensive about this plan, so it was recommended that she obtain appointment this week for evaluation and possible imaging.  Patient will have appointment on 12/31/2017 at 1115.  Recent appointment with PCP of these concerns were not mentioned during appointment per daughter.  Advised daughter that if any symptoms worsen or patient develops new symptoms, to call EMS immediately.  Advised daughter to call with any other questions or concerns.

## 2017-12-31 ENCOUNTER — Telehealth: Payer: Self-pay | Admitting: Adult Health

## 2017-12-31 ENCOUNTER — Ambulatory Visit
Admission: RE | Admit: 2017-12-31 | Discharge: 2017-12-31 | Disposition: A | Discharge status: Home / Self Care | Payer: Medicare Other | Source: Ambulatory Visit | Attending: Adult Health | Admitting: Adult Health

## 2017-12-31 ENCOUNTER — Encounter: Payer: Self-pay | Admitting: Adult Health

## 2017-12-31 ENCOUNTER — Ambulatory Visit (INDEPENDENT_AMBULATORY_CARE_PROVIDER_SITE_OTHER): Payer: Medicare Other | Admitting: Adult Health

## 2017-12-31 VITALS — BP 123/68 | HR 68 | Ht 63.0 in | Wt 146.2 lb

## 2017-12-31 DIAGNOSIS — I10 Essential (primary) hypertension: Secondary | ICD-10-CM

## 2017-12-31 DIAGNOSIS — E785 Hyperlipidemia, unspecified: Secondary | ICD-10-CM | POA: Diagnosis not present

## 2017-12-31 DIAGNOSIS — R4701 Aphasia: Secondary | ICD-10-CM | POA: Diagnosis not present

## 2017-12-31 DIAGNOSIS — R0602 Shortness of breath: Secondary | ICD-10-CM

## 2017-12-31 DIAGNOSIS — Z8673 Personal history of transient ischemic attack (TIA), and cerebral infarction without residual deficits: Secondary | ICD-10-CM

## 2017-12-31 DIAGNOSIS — R29818 Other symptoms and signs involving the nervous system: Secondary | ICD-10-CM | POA: Diagnosis not present

## 2017-12-31 MED ORDER — ATORVASTATIN CALCIUM 40 MG PO TABS: 40.0000 mg | ORAL_TABLET | Freq: Every day | ORAL | 3 refills | Status: DC

## 2017-12-31 NOTE — Telephone Encounter (Signed)
[employer] Medicare Auth: NPR patient is going to go to GI as a walk in and will wait to have the CT and DG done.

## 2017-12-31 NOTE — Patient Instructions (Signed)
Continue aspirin 325 mg daily  and increase lipitor to 40mg  for secondary stroke prevention  Schedule appointment for chest xray and CT scan of your head - we will call you  Consider speech therapy in the future  Continue to follow up with PCP regarding cholesterol and blood pressure management   Continue to monitor blood pressure at home  Maintain strict control of hypertension with blood pressure goal below 130/90, diabetes with hemoglobin A1c goal below 6.5% and cholesterol with LDL cholesterol (bad cholesterol) goal below 70 mg/dL. I also advised the patient to eat a healthy diet with plenty of whole grains, cereals, fruits and vegetables, exercise regularly and maintain ideal body weight.  Followup in the future with me in 3 months or call earlier if needed       Thank you for coming to see [place] at [facility]. I hope we have been able to provide you high quality care today.  You may receive a patient satisfaction survey over the next few weeks. We would appreciate your feedback and comments so that we may continue to improve ourselves and the health of our patients.

## 2017-12-31 NOTE — Progress Notes (Addendum)
[facility] [address] [place] [address] [phone]       OFFICE FOLLOW-UP NOTE  Ms. [name] Date of Birth:  April 02, 1929 Medical Record Number:  [id number]   HPI: Aerial R Jonesis an 84 yo female with hx of HTN and CHF presents for wake up stroke an symptoms of difficulty speaking and R sided weakness. MRI scan of the brain showed a small left paracentral pontine and left caudate head lacunar infarct. CT angiogram of the brain and neck showed no large vessel intracranial or extracranial stenosis. CT perfusion scan showed no significant mismatch. There is 3.9 cm dilatation of ascending thoracic aorta. Carotid ultrasound showed no significant extracranial stenosis. Transthoracic echo showed normal ejection fraction. Patient was not on aspirin 81 mg prior to admission this was switched to Aggrenox. Patient was transferred to inpatient rehabilitation. She did well and has been discharged home. She is finished PHYSICAL and occupation therapy. She is able to walk with a foot brace for her chronic right foot drop from lumbar radiculopathy. She uses a beachchair for long distances. She still has some weakness in the right hand and diminished fine motor skills. His speech and in has improved. She is tolerating Lipitor well without side effects and plans to have lipid profile checked next month by primary physician. Patient was unable to afford Aggrenox and has switched to aspirin to 25 mg daily which is tolerating well without bruising or bleeding.  05/05/2017 visit PS:  she returns for follow-up after last visit 6 months ago. She is accompanied by her daughter [name]. Patient continues to do well. She has had no recurrent stroke or TIA symptoms. She continues to have walking difficulty due to the right foot drop and uses a beta blocker. She is careful and has had no falls or injuries. She remains on aspirin which is tolerating well without upset stomach or bruising or  bleeding. Her blood pressure is under good control and today it is 134/67. She remains on Lipitor 20 mg which is tolerating well without muscle aches or pains. She had lipid profile checked 2 weeks ago by primary physician and was satisfactory. She has an upcoming appointment to see her cardiologist next month. She did denies any new health problems except she had surgery for carpal tunnel in the right hand in May 2018. Patient and daughter feels that she is on too many medications and they're trying to reduce her medicines. The patient was started on Prozac for depression following a stroke in January but both patient and daughter feels she is no longer depressed and would like to come off the Prozac if possible  Telephone encounter 12/29/17 : Daughter Raechel Chute called office with concerns of worsening stroke deficits over the past 2 to 3 months.  Patient was doing well with a plateau in her memory issues, speech issues and expressive aphasia but overall patient was doing well.  For the past 2 to 3 months, daughter states her and her sister noticed worsening in her speech and memory but did not want to mention this to her mother they did not want to upset her.  Apparently patient saw flyer in the mail which talked about strokes and patient went to daughter with concerns of worsening memory and speech as well.  Per daughter, this is been a gradual worsening and does not seem to be getting better or plateauing.  Approximately 3 weeks ago, patient was having consistent hypotension around 80/50 so PCP decreased hydrochlorothiazide from  25 to 12.5.  Most recent pressures have been SBP in the 110-120 range.  Daughter has not noticed a difference in speech or memory since improvement in blood pressure.  Patient has been compliant with all other medications.  Denies recent falls, weakness on one side, tingling on one side or any other new symptoms.  Denies recent headaches or visual changes.  Denies recent illnesses.   Daughter apprehensive about bringing patient to hospital as this increases the patient's anxiety.  It was recommended that patient be brought to the hospital for possible evaluation of evolution of old stroke or possible new stroke but daughter apprehensive about this plan, so it was recommended that she obtain appointment this week for evaluation and possible imaging.  Patient will have appointment on 12/31/2017 at 1115.  Recent appointment with PCP of these concerns were not mentioned during appointment per daughter.  Advised daughter that if any symptoms worsen or patient develops new symptoms, to call EMS immediately.  Advised daughter to call with any other questions or concerns.  12/31/17 Update: Patient is being seen today by request of daughter for worsening memory and expressive aphasia.  This is been occurring for the past 2 to 3 months and patient is unable to tell if this is an acute change or has been gradually getting worse.  Per daughter, patient has also been having a difficult time concentrating as she is unable to follow an entire episode of a certain show or if somebody is speaking too fast, she states that she will to in the mall because she is unable to follow along with them.  This is all also new for patient over the last 2 to 3 months.  Patient also states that she feels as if her tongue feels thick at times and that also makes it difficult to talk.  Patient does have a history of COPD and easily will become short of breath especially with increased fatigue and trying to speak.  She gets quickly fatigued with increased amount of conversation or phone calls which will end up leading to being short of breath.  Recent lab work completed at PCP office and all within normal limits except for continued increase in LDL. Patient continues to take aspirin without side effects of bleeding or bruising.  Continues to take Lipitor without side effects of myalgias.  Blood pressure satisfactory 123/68.   Patient is currently living at home independently with both daughters that live close by to assist when needed.  Patient is here today for evaluation of worsening memory and expressive aphasia.   ROS:   14 system review of systems is positive for shortness of breath, memory loss and speaking difficulty and all other systems negative and all other systems negative PMH:  Past Medical History:  Diagnosis Date  . Arthritis   . Asthma in child    question of interstitial lung disease  . CHF (congestive heart failure) ([place])   . Chronic back pain   . Chronic pain of right knee    for past 30 years  . Heart murmur   . Heartburn   . History of right foot drop   . Hypertension   . Hypothyroidism   . Myelodysplastic syndrome ([place])   . Stroke (cerebrum) ([place]) 08/2016    Social History:  Social History   Socioeconomic History  . Marital status: Widowed    Spouse name: Not on file  . Number of children: Not on file  . Years of education: Not on  file  . Highest education level: Not on file  Occupational History  . Not on file  Social Needs  . Financial resource strain: Not on file  . Food insecurity:    Worry: Not on file    Inability: Not on file  . Transportation needs:    Medical: Not on file    Non-medical: Not on file  Tobacco Use  . Smoking status: Never Smoker  . Smokeless tobacco: Never Used  Substance and Sexual Activity  . Alcohol use: No  . Drug use: No  . Sexual activity: Never  Lifestyle  . Physical activity:    Days per week: Not on file    Minutes per session: Not on file  . Stress: Not on file  Relationships  . Social connections:    Talks on phone: Not on file    Gets together: Not on file    Attends religious service: Not on file    Active member of club or organization: Not on file    Attends meetings of clubs or organizations: Not on file    Relationship status: Not on file  . Intimate partner violence:    Fear of current or ex partner: Not on file     Emotionally abused: Not on file    Physically abused: Not on file    Forced sexual activity: Not on file  Other Topics Concern  . Not on file  Social History Narrative  . Not on file    Medications:   Current Outpatient Medications on File Prior to Visit  Medication Sig Dispense Refill  . albuterol (PROVENTIL HFA;VENTOLIN HFA) 108 (90 Base) MCG/ACT inhaler Inhale 1-2 puffs into the lungs every 4 (four) hours as needed for wheezing.    Marland Kitchen amLODipine (NORVASC) 5 MG tablet Take 1 tablet (5 mg total) by mouth daily. 30 tablet 0  . aspirin EC 325 MG tablet Take by mouth.    Marland Kitchen atorvastatin (LIPITOR) 20 MG tablet Take 1 tablet (20 mg total) by mouth daily at 6 PM. 30 tablet 0  . azelastine (ASTELIN) 0.1 % nasal spray Place 2 sprays into both nostrils daily as needed for rhinitis or allergies.     . Calcium Carbonate-Vitamin D 600-400 MG-UNIT tablet Take by mouth.    . Cholecalciferol (VITAMIN D-3) 1000 units CAPS Take 1,000 Units by mouth daily.    . Cyanocobalamin (B-12 PO) Take 1,000 mg by mouth.    . cyclobenzaprine (FLEXERIL) 5 MG tablet Take 1 tablet by mouth 3 (three) times daily as needed.  0  . hydrochlorothiazide (HYDRODIURIL) 12.5 MG tablet Take 1 tablet by mouth daily.    Marland Kitchen levothyroxine (SYNTHROID, LEVOTHROID) 88 MCG tablet Take 88 mcg by mouth daily before breakfast.    . lisinopril (PRINIVIL,ZESTRIL) 40 MG tablet Take 1 tablet (40 mg total) by mouth daily. 30 tablet 0  . loratadine (CLARITIN) 10 MG tablet Take 10 mg by mouth daily.    . metoprolol succinate (TOPROL-XL) 25 MG 24 hr tablet Take 1 tablet by mouth daily.    . NONFORMULARY OR COMPOUNDED ITEM Anti inflammatory    . polyethylene glycol powder (GLYCOLAX/MIRALAX) powder Take 17 g by mouth daily.    . potassium chloride (K-DUR,KLOR-CON) 10 MEQ tablet Take by mouth.    . ranitidine (ZANTAC) 150 MG tablet Take 150 mg by mouth 2 (two) times daily.    Marland Kitchen FLUoxetine (PROZAC) 10 MG capsule Take 1 capsule (10 mg total) by mouth  daily. (Patient not taking: Reported  on 12/31/2017) 30 capsule 0  . HYDROcodone-acetaminophen (NORCO) 7.5-325 MG tablet TK 1 T PO ONCE D PRN P  0   No current facility-administered medications on file prior to visit.     Allergies:  No Known Allergies  Physical Exam General: well developed, well nourished,Elderly Caucasian lady, seated, in no evident distress Head: head normocephalic and atraumatic.  Neck: supple with no carotid or supraclavicular bruits Cardiovascular: regular rate and rhythm, no murmurs Pulmonary: Tachypnea with increased conversation Musculoskeletal: no deformity Skin:  no rash/petichiae Vascular:  Normal pulses all extremities Vitals:   12/31/17 1103  BP: 123/68  Pulse: 68   Neurologic Exam Mental Status: Awake and fully alert.  Expressive aphasia observed along with hesitation in speech.  Oriented to place and time. Remote memory intact. Attention span, concentration and fund of knowledge appropriate. Mood and affect appropriate.  Recall 1/3.  AFT 11.  Clock drawing 4/4.  Able to do serial additions. Cranial Nerves: Fundoscopic exam reveals sharp disc margins. Pupils equal, briskly reactive to light. Extraocular movements full without nystagmus. Visual fields full to confrontation. Hearing intact. Facial sensation intact.Mild right lower facial asymmetry., tongue, palate moves normally and symmetrically.  Motor: Normal bulk and tone. Normal strength in all tested extremity muscles Except right foot drop with ankle dorsiflexors 0/5 strength. Mild weakness of right grip and intrinsic hand muscles. Fine finger movements are diminished on the right. Orbits left over right upper extremity. Sensory.: intact to touch ,pinprick .position and vibratory sensation.  Coordination: Rapid alternating movements normal in all extremities. Finger-to-nose and heel-to-shin performed accurately bilaterally. Gait and Station: Currently using wheelchair without assistance of rolling walker  at this time -did not assess ambulation Reflexes: 1+ and symmetric. Toes downgoing.       ASSESSMENT: 84 year old Caucasian lady with left pontine and basal ganglia lacunar infarct in January 2018 secondary to small vessel disease. Vascular risk factor of hypertension, hyperlipidemia, cerebrovascular disease and age. Chronic right foot drop secondary to lumbar radiculopathy from degenerative spine disease.  Patient returns today to request her daughter for worsening expressive aphasia and memory.    PLAN: -Continue aspirin 325 mg daily  and increase Lipitor from 20 mg to 40 mg as recent LDL 114 for secondary stroke prevention -Referral for CT head to assess for acute/subacute stroke -Referral for chest x-ray to assess for possible worsening of COPD -F/u with PCP regarding your HLD and HTN management -continue to monitor BP at home -Advised patient to possibly consider home speech therapy -patient will think about this and give [place] a decision when we called to give CT and x-ray results -Maintain strict control of hypertension with blood pressure goal below 130/90, diabetes with hemoglobin A1c goal below 6.5% and cholesterol with LDL cholesterol (bad cholesterol) goal below 70 mg/dL. I also advised the patient to eat a healthy diet with plenty of whole grains, cereals, fruits and vegetables, exercise regularly and maintain ideal body weight.  Follow up in 3 months or call earlier if needed  Greater than 50% of time during this 25 minute visit was spent on counseling,explanation of diagnosis of lacunar stroke, planning of further management, discussion with patient and family and coordination of care  Venancio Poisson, [facility]  [facility] [address] [place], [place] [address]  Phone [phone] Fax [phone]

## 2018-01-01 NOTE — Progress Notes (Signed)
I have reviewed and agreed above plan. 

## 2018-01-07 ENCOUNTER — Telehealth: Payer: Self-pay

## 2018-01-07 DIAGNOSIS — Z8673 Personal history of transient ischemic attack (TIA), and cerebral infarction without residual deficits: Secondary | ICD-10-CM

## 2018-01-07 NOTE — Telephone Encounter (Signed)
Notes recorded by Marval Regal, RN on 01/07/2018 at 10:59 AM EDT Rn call patients daughter Yvonne Parrish on dpr. Rn stated the Ct scan did not show any acute stroke. The chest xray look good without concerns. Pts daughter verbalized understanding. ------

## 2018-01-07 NOTE — Telephone Encounter (Signed)
-----   Message from Venancio Poisson, NP sent at 01/06/2018  7:15 AM EDT ----- Please notify patient that her CT scan did not show any acute stroke. Her chest xray also looked good without any concerns. We spoke about speech therapy at her past appointment. Please advise patient that if she would like to pursue speech therapy, I can place the order. Thank you.

## 2018-01-07 NOTE — Telephone Encounter (Signed)
Rn was given Neoma Laming pts daughter on dpr the Ct results.Rn stated per Janett Billow Np note if she still wanted speech therapy it can be order. Neoma Laming stated she will ask her mom this week,and will call us back. Neoma Laming stated it would likely be home Lane reminded Neoma Laming if she request ST for her mom to call before 01/31/2018 because they will need a current office visit for billing purpose. Daughter will call back.

## 2018-01-12 NOTE — Telephone Encounter (Signed)
Pt daughter(on DPR) has called back to inform pt would like to have speech therapy set up to be done in her home.  Please call

## 2018-01-12 NOTE — Telephone Encounter (Signed)
Order placed for home ST. Thank you.

## 2018-01-12 NOTE — Addendum Note (Signed)
Addended by: Venancio Poisson on: 01/12/2018 01:04 PM   Modules accepted: Orders

## 2018-01-27 ENCOUNTER — Other Ambulatory Visit: Payer: Self-pay | Admitting: Physical Medicine and Rehabilitation

## 2018-01-27 DIAGNOSIS — M545 Low back pain, unspecified: Secondary | ICD-10-CM

## 2018-02-06 ENCOUNTER — Ambulatory Visit
Admission: RE | Admit: 2018-02-06 | Discharge: 2018-02-06 | Disposition: A | Discharge status: Home / Self Care | Payer: Medicare Other | Source: Ambulatory Visit | Attending: Physical Medicine and Rehabilitation | Admitting: Physical Medicine and Rehabilitation

## 2018-02-06 DIAGNOSIS — M545 Low back pain, unspecified: Secondary | ICD-10-CM

## 2018-04-02 ENCOUNTER — Ambulatory Visit: Payer: Medicare Other | Admitting: Adult Health

## 2018-04-15 DIAGNOSIS — R002 Palpitations: Secondary | ICD-10-CM | POA: Insufficient documentation

## 2018-05-18 IMAGING — CT CT ABD-PEL WO/W CM
2 of 6 series · 13 of 32 positions shown, 18 images · IV contrast (iopamidol)
Comparison: 07/09/2006

CLINICAL DATA: Microscopic hematuria.

EXAM:
CT ABDOMEN AND PELVIS WITHOUT AND WITH CONTRAST
TECHNIQUE: Multidetector CT imaging of the abdomen and pelvis was performed
following the standard protocol before and following the bolus
administration of intravenous contrast.
CONTRAST:  125mL [id number] IOPAMIDOL (ZQL8H8-4FF) INJECTION 61%

[Series 2: axial pre · axial · non-contrast · 0.70mm/px · z∈[-931,-606]mm · 7 of 87 slices shown, 12 images]
[im 11/87  soft-tissue]
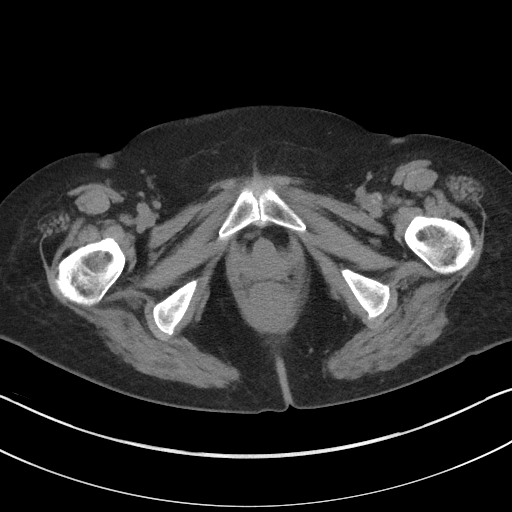
[im 11/87  bone]
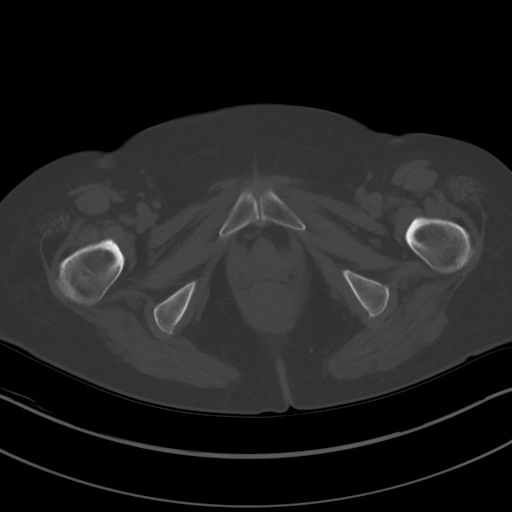
[im 22/87  soft-tissue]
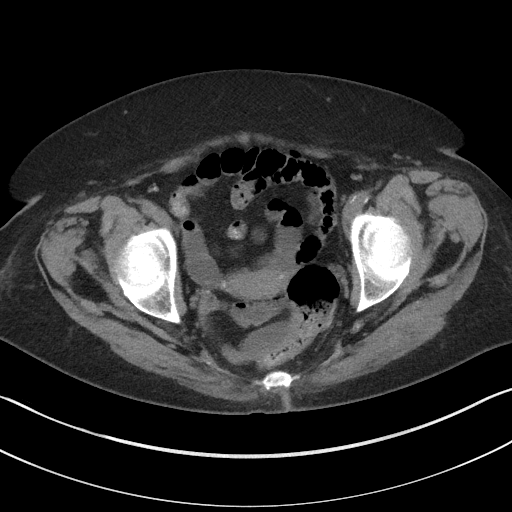
[im 33/87  soft-tissue]
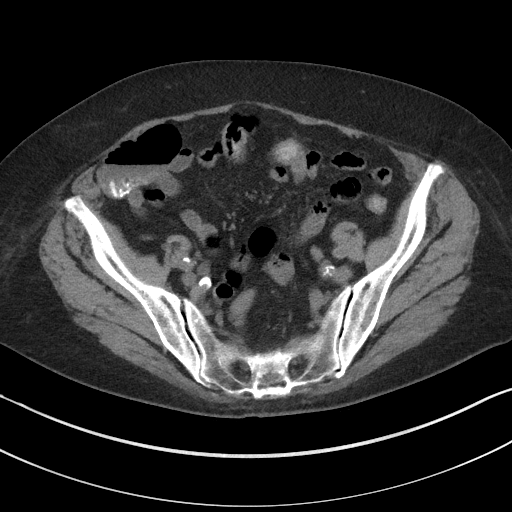
[im 44/87  soft-tissue]
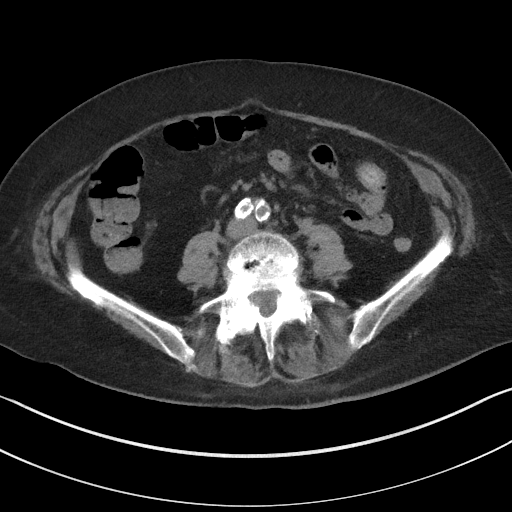
[im 44/87  lung]
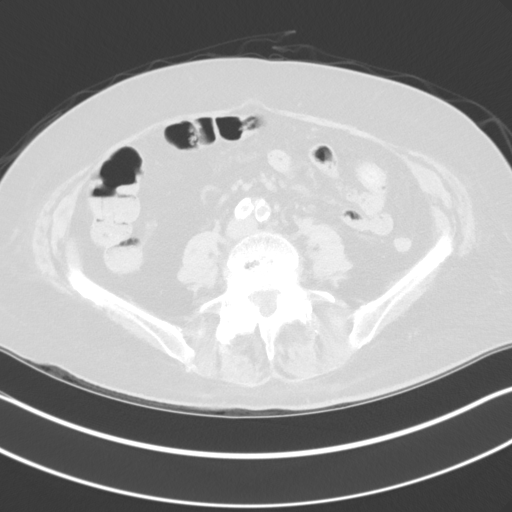
[im 54/87  soft-tissue]
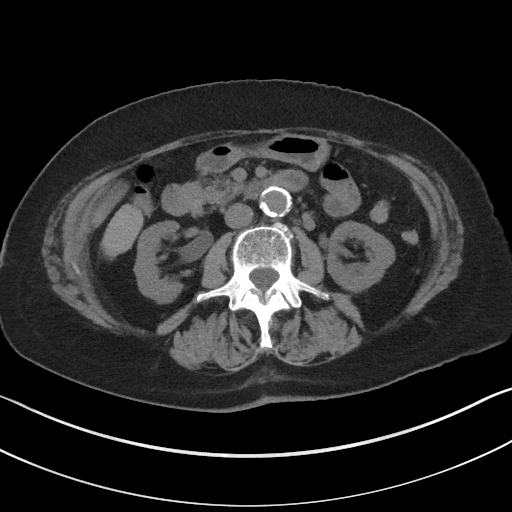
[im 54/87  lung]
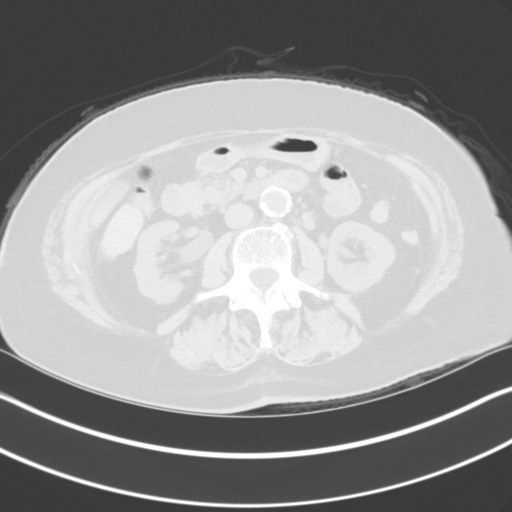
[im 65/87  soft-tissue]
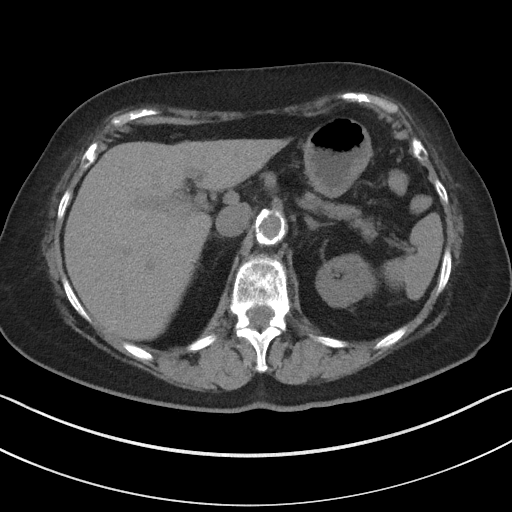
[im 65/87  lung]
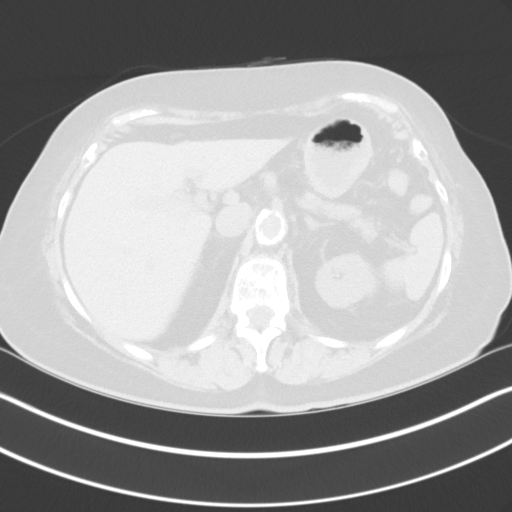
[im 76/87  soft-tissue]
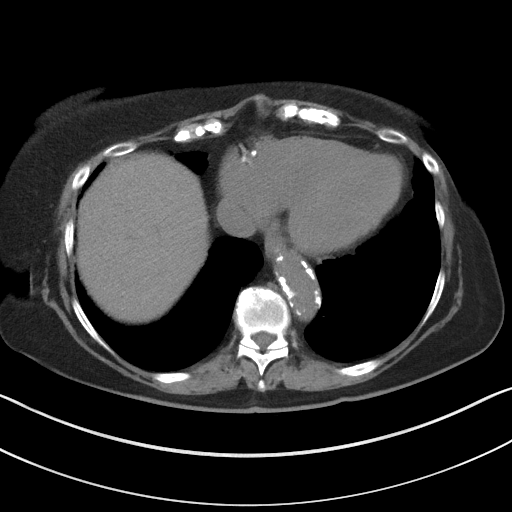
[im 76/87  lung]
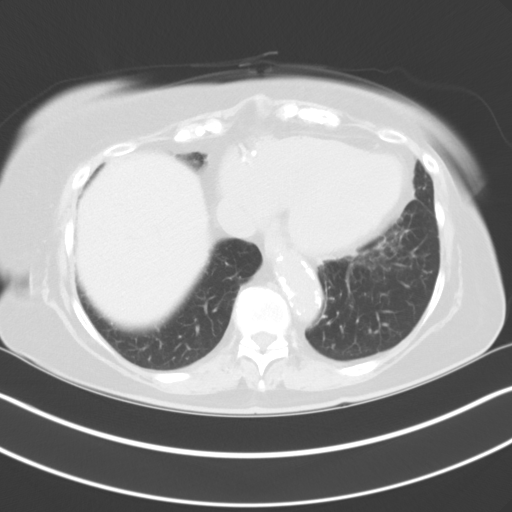

[Series 4: axial post · axial · 0.70mm/px · z∈[-921,-616]mm · 6 of 87 slices shown]
[im 13/87  soft-tissue]
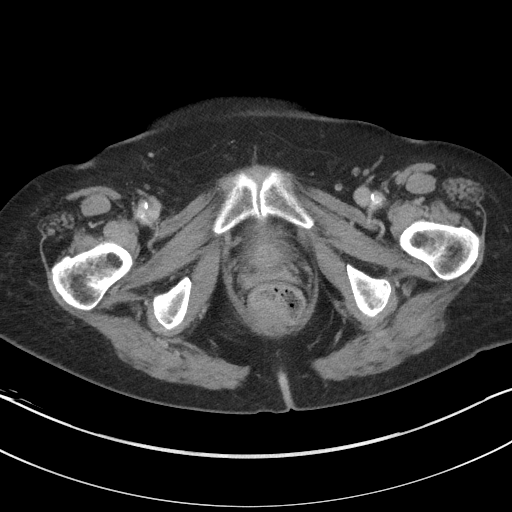
[im 25/87  soft-tissue]
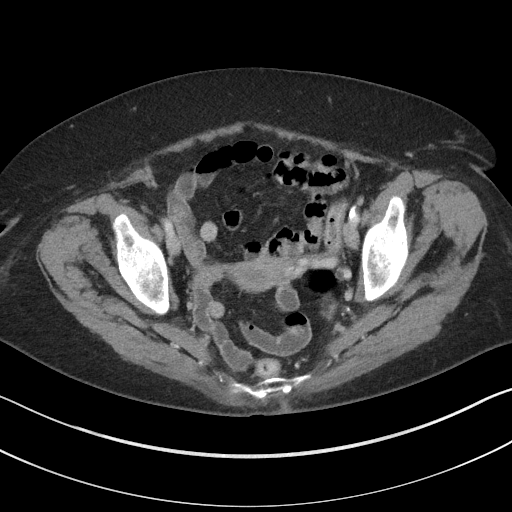
[im 37/87  soft-tissue]
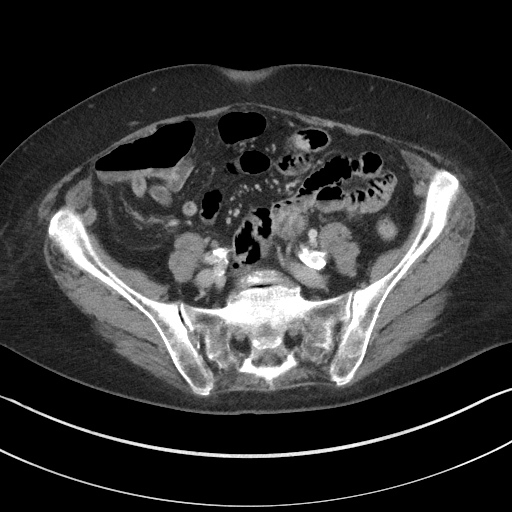
[im 50/87  soft-tissue]
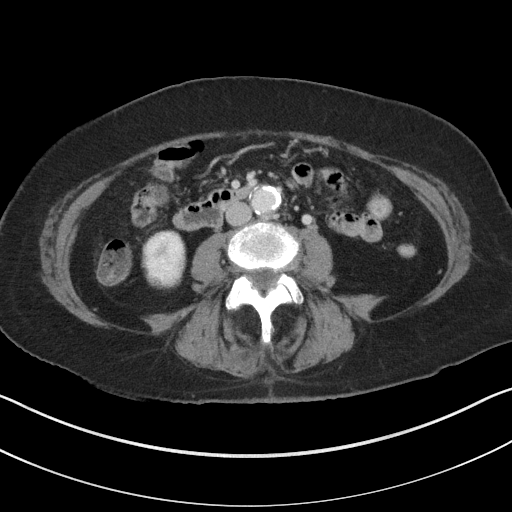
[im 62/87  soft-tissue]
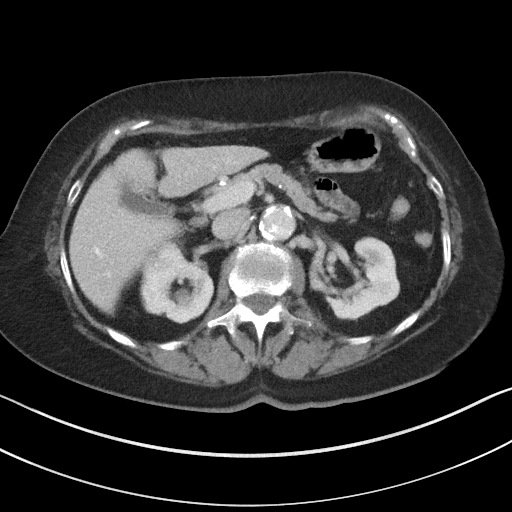
[im 74/87  soft-tissue]
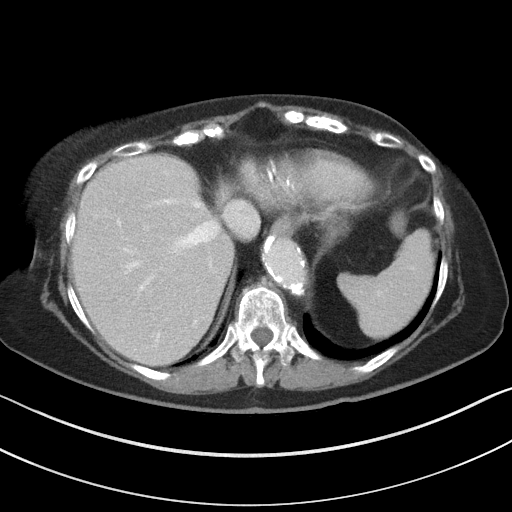

[13 of 32 positions shown; findings below may reference images not displayed]

FINDINGS: Lower chest: The lung bases are clear of acute process. No pleural
effusion. Small calcified granuloma noted at the right lung base.

Severe atherosclerotic calcifications involving the thoracic aorta
and coronary arteries.

Small hiatal hernia.

Hepatobiliary: Small low-attenuation right hepatic lobe lesion of
the dome is likely a benign cyst. This is stable when compared to
the prior CT scan. No worrisome hepatic lesions or intrahepatic
biliary dilatation. The gallbladder is unremarkable. No common bile
duct dilatation.

Pancreas: No mass, inflammation or ductal dilatation. Mild atrophy
of the pancreatic body and tail appears stable.

Spleen: Normal size.  No focal lesions.

Adrenals/Urinary Tract: The adrenal glands are unremarkable.

No renal, ureteral or bladder calculi. After contrast administration
both kidneys demonstrate normal enhancement/ perfusion. Tiny
scattered cysts are noted. Mild renal scarring changes. No worrisome
renal lesions. The delayed images do not demonstrate any significant
collecting system abnormalities. Both ureters are normal. The
bladder is normal. No asymmetric bladder wall thickening or bladder
mass.

Stomach/Bowel: The stomach, duodenum, small bowel and colon are
grossly normal without oral contrast. No inflammatory changes, mass
lesions or obstructive findings. The terminal ileum and appendix are
normal except for tiny appendicoliths.

Vascular/Lymphatic: Severe atherosclerotic calcifications involving
the aorta and branch vessels. No focal aneurysm or dissection. The
branch vessels are patent. The major venous structures are patent.
Date left renal artery stent is noted.

No mesenteric or retroperitoneal mass or adenopathy. Small scattered
lymph nodes are noted.

Reproductive: The uterus and ovaries are unremarkable.

Other: No pelvic mass or adenopathy. No free pelvic fluid
collections. No inguinal mass or adenopathy. No abdominal wall
hernia or subcutaneous lesions.

Musculoskeletal: No significant bony findings. Moderate degenerative
lumbar spondylosis with multilevel disc disease and facet disease.
IMPRESSION: 1. No CT findings to account for the patient's microhematuria. No
renal, ureteral or bladder calculi or mass.
2. No acute abdominal/pelvic findings, mass lesions or
lymphadenopathy.
3. Extensive atherosclerotic calcifications involving the thoracic
and abdominal aorta and branch vessels including the coronary
arteries.
4. Small hiatal hernia.

## 2018-11-17 ENCOUNTER — Telehealth: Payer: Self-pay | Admitting: Adult Health

## 2018-11-17 NOTE — Telephone Encounter (Signed)
Pts daughter called in and wanted to discuss some symptoms the patient is having ... she has been having vivid dreams for a couple of weeks , shes having trouble when waking up understand if the dream is real or if its just a dream.

## 2018-11-17 NOTE — Telephone Encounter (Signed)
Left vm for patient daughter Neoma Laming to call back about her moms viivid dreams.Pt was last seen 12/2017 and cancel appt in 03/2018. THey can schedule a video web visit with Janett Billow NP.

## 2018-11-17 NOTE — Telephone Encounter (Signed)
She was previously seen almost 1 year ago as daughter was concerned of worsening memory.  Scheduled follow-up visit in 03/2018 but appointment canceled and was not rescheduled.  She can schedule WebEx visit if interested or follow-up with PCP for ongoing monitoring/management of memory.

## 2019-02-03 DIAGNOSIS — D649 Anemia, unspecified: Secondary | ICD-10-CM | POA: Insufficient documentation

## 2019-02-03 DIAGNOSIS — N289 Disorder of kidney and ureter, unspecified: Secondary | ICD-10-CM | POA: Insufficient documentation

## 2019-02-03 DIAGNOSIS — D539 Nutritional anemia, unspecified: Secondary | ICD-10-CM | POA: Insufficient documentation

## 2019-02-22 ENCOUNTER — Inpatient Hospital Stay: Payer: Medicare Other | Attending: Oncology | Admitting: Oncology

## 2019-02-22 DIAGNOSIS — D469 Myelodysplastic syndrome, unspecified: Secondary | ICD-10-CM

## 2019-02-22 DIAGNOSIS — D539 Nutritional anemia, unspecified: Secondary | ICD-10-CM

## 2019-02-22 DIAGNOSIS — D649 Anemia, unspecified: Secondary | ICD-10-CM | POA: Diagnosis not present

## 2019-02-22 NOTE — Progress Notes (Signed)
Speaking with [name] daughter of patient, she mentions that they were told before that patient was taking Plavix and this may have caused Myelodysplastic syndrome. She also mentions patient has had Procrit in the past but is worried it may be expensive if she needs this now. Also other daughter may have been exposed to Covid and is waiting to get tested.

## 2019-02-25 ENCOUNTER — Encounter: Payer: Self-pay | Admitting: Oncology

## 2019-02-25 NOTE — Progress Notes (Signed)
I connected with Yvonne Parrish on 02/25/19 at  1:30 PM EDT by video enabled telemedicine visit and verified that I am speaking with the correct person using two identifiers.   I discussed the limitations, risks, security and privacy concerns of performing an evaluation and management service by telemedicine and the availability of in-person appointments. I also discussed with the patient that there may be a patient responsible charge related to this service. The patient expressed understanding and agreed to proceed.  Other persons participating in the visit and their role in the encounter:  Patients daughter  Patient's location:  home Provider's location:  work  Reason for referral: History of myelodysplastic syndrome Referring provider Dr. Ginette Pitman  History of present illness: Patient is a 85 year old female with a past medical history significant for hypertension, hyperlipidemia, CHF, interstitial lung disease, CAD status post CABG and osteoporosis among other medical problems.  Patient's daughter states that she was also diagnosed with myelodysplastic syndrome many years ago on a bone marrow biopsy by Dr. Durenda Hurt here in [place].  She has not required any treatment for this.  Patient was noted to be anemic recently and given her history of MDS has been referred to [place] for further management.  Most recent CBC from 01/27/2019 showed white count of 7.4 with a normal differential, H&H of 10.9/33.1 with an MCV of 130 and a platelet count of 165.  Of note her hemoglobin at baseline is around 11.  CMP was within normal limits.  Iron study showed a ferritin level of 76.  TSH was normal.  Patient is elderly and frail and has chronic exertional shortness of breath.  She had an episode of possible syncope on 02/04/2019 and was evaluated by cardiology.     Review of Systems  Constitutional: Positive for malaise/fatigue. Negative for chills, fever and weight loss.  HENT: Negative for congestion, ear  discharge and nosebleeds.   Eyes: Negative for blurred vision.  Respiratory: Positive for shortness of breath. Negative for cough, hemoptysis, sputum production and wheezing.   Cardiovascular: Negative for chest pain, palpitations, orthopnea and claudication.  Gastrointestinal: Negative for abdominal pain, blood in stool, constipation, diarrhea, heartburn, melena, nausea and vomiting.  Genitourinary: Negative for dysuria, flank pain, frequency, hematuria and urgency.  Musculoskeletal: Negative for back pain, joint pain and myalgias.  Skin: Negative for rash.  Neurological: Negative for dizziness, tingling, focal weakness, seizures, weakness and headaches.  Endo/Heme/Allergies: Does not bruise/bleed easily.  Psychiatric/Behavioral: Negative for depression and suicidal ideas. The patient does not have insomnia.     No Known Allergies  Past Medical History:  Diagnosis Date  . Arthritis   . Asthma in child    question of interstitial lung disease  . CHF (congestive heart failure) ([place])   . Chronic back pain   . Chronic pain of right knee    for past 30 years  . Heart murmur   . Heartburn   . History of right foot drop   . Hypertension   . Hypothyroidism   . Myelodysplastic syndrome ([place])   . Stroke (cerebrum) ([place]) 08/2016    Past Surgical History:  Procedure Laterality Date  . CORONARY ANGIOPLASTY    . CORONARY ARTERY BYPASS GRAFT  1999  . KNEE ARTHROSCOPY    . knee fracture     Right knee pinning in '90's   . LUMBAR SPINE SURGERY    . TONSILLECTOMY      Social History   Socioeconomic History  . Marital status: Widowed  Spouse name: Not on file  . Number of children: Not on file  . Years of education: Not on file  . Highest education level: Not on file  Occupational History  . Not on file  Social Needs  . Financial resource strain: Not on file  . Food insecurity    Worry: Not on file    Inability: Not on file  . Transportation needs    Medical: Not on file     Non-medical: Not on file  Tobacco Use  . Smoking status: Never Smoker  . Smokeless tobacco: Never Used  Substance and Sexual Activity  . Alcohol use: No  . Drug use: No  . Sexual activity: Never  Lifestyle  . Physical activity    Days per week: Not on file    Minutes per session: Not on file  . Stress: Not on file  Relationships  . Social Herbalist on phone: Not on file    Gets together: Not on file    Attends religious service: Not on file    Active member of club or organization: Not on file    Attends meetings of clubs or organizations: Not on file    Relationship status: Not on file  . Intimate partner violence    Fear of current or ex partner: Not on file    Emotionally abused: Not on file    Physically abused: Not on file    Forced sexual activity: Not on file  Other Topics Concern  . Not on file  Social History Narrative  . Not on file    Family History  Problem Relation Age of Onset  . Stroke Mother   . Heart disease Father   . Kidney cancer Neg Hx   . Bladder Cancer Neg Hx   . Kidney disease Neg Hx      Current Outpatient Medications:  .  albuterol (PROVENTIL HFA;VENTOLIN HFA) 108 (90 Base) MCG/ACT inhaler, Inhale 1-2 puffs into the lungs every 4 (four) hours as needed for wheezing., Disp: , Rfl:  .  amLODipine (NORVASC) 5 MG tablet, Take 1 tablet (5 mg total) by mouth daily., Disp: 30 tablet, Rfl: 0 .  aspirin EC 325 MG tablet, Take by mouth., Disp: , Rfl:  .  atorvastatin (LIPITOR) 40 MG tablet, Take 1 tablet (40 mg total) by mouth daily at 6 PM., Disp: 90 tablet, Rfl: 3 .  azelastine (ASTELIN) 0.1 % nasal spray, Place 2 sprays into both nostrils daily as needed for rhinitis or allergies. , Disp: , Rfl:  .  Calcium Carbonate-Vitamin D 600-400 MG-UNIT tablet, Take by mouth., Disp: , Rfl:  .  Cholecalciferol (VITAMIN D-3) 1000 units CAPS, Take 1,000 Units by mouth daily., Disp: , Rfl:  .  Cyanocobalamin (B-12 PO), Take 1,000 mg by mouth.,  Disp: , Rfl:  .  cyclobenzaprine (FLEXERIL) 5 MG tablet, Take 1 tablet by mouth 3 (three) times daily as needed., Disp: , Rfl: 0 .  famotidine (PEPCID) 20 MG tablet, Take 20 mg by mouth 2 (two) times daily., Disp: , Rfl:  .  FLUoxetine (PROZAC) 10 MG capsule, Take 1 capsule (10 mg total) by mouth daily. (Patient taking differently: Take 20 mg by mouth daily. ), Disp: 30 capsule, Rfl: 0 .  hydrochlorothiazide (HYDRODIURIL) 12.5 MG tablet, Take 12.5 mg by mouth daily., Disp: , Rfl:  .  HYDROcodone-acetaminophen (NORCO) 7.5-325 MG tablet, TK 1 T PO ONCE D PRN P, Disp: , Rfl: 0 .  levothyroxine (SYNTHROID,  LEVOTHROID) 88 MCG tablet, Take 88 mcg by mouth daily before breakfast., Disp: , Rfl:  .  lisinopril (PRINIVIL,ZESTRIL) 40 MG tablet, Take 1 tablet (40 mg total) by mouth daily., Disp: 30 tablet, Rfl: 0 .  loratadine (CLARITIN) 10 MG tablet, Take 10 mg by mouth daily., Disp: , Rfl:  .  metoprolol succinate (TOPROL-XL) 25 MG 24 hr tablet, Take 1 tablet by mouth daily., Disp: , Rfl:  .  polyethylene glycol powder (GLYCOLAX/MIRALAX) powder, Take 17 g by mouth daily., Disp: , Rfl:  .  potassium chloride (K-DUR) 10 MEQ tablet, Take 10 mEq by mouth daily., Disp: , Rfl:   No results found.  No images are attached to the encounter.   CMP Latest Ref Rng & Units 01/14/2017  Glucose 65 - 99 mg/dL -  BUN 8 - 27 mg/dL 11  Creatinine 0.57 - 1.00 mg/dL 0.87  Sodium 135 - 145 mmol/L -  Potassium 3.5 - 5.1 mmol/L -  Chloride 101 - 111 mmol/L -  CO2 22 - 32 mmol/L -  Calcium 8.9 - 10.3 mg/dL -  Total Protein 6.5 - 8.1 g/dL -  Total Bilirubin 0.3 - 1.2 mg/dL -  Alkaline Phos 38 - 126 U/L -  AST 15 - 41 U/L -  ALT 14 - 54 U/L -   CBC Latest Ref Rng & Units 09/05/2016  WBC 4.0 - 10.5 K/uL 7.0  Hemoglobin 12.0 - 15.0 g/dL 10.5(L)  Hematocrit 36.0 - 46.0 % 32.0(L)  Platelets 150 - 400 K/uL 252     Observation/objective: Patient is a 85 year old female with multiple medical problems referred for history  of MDS  Assessment and plan: Patient's baseline hemoglobin has been around 11 for the last few years and most recent blood work showed a hemoglobin of 10.1.  Iron studies, TSH and B12 was normal.  Although patient was diagnosed with MDS many years ago on bone marrow biopsy, patient has not required any treatment for this so far.  She likely has low risk MDS given that she has not developed any significant cytopenias for all these years.  Patient has a normal white count with a normal differential as well as a normal platelet count.  I suspect her anemia is chronic secondary to underlying comorbidities and anemia of chronic disease.  I discussed with the patient and her daughter that her CBC is close to her baseline and I do not think that her syncope was related to her anemia.  She does not warrant a repeat bone marrow biopsy at this time in the absence of worsening cytopenias and does not require any treatment for her MDS at this time  It would be reasonable to continue to monitor her CBC and if her cytopenias are worse we could consider doing further work-up at that time.  Patient and daughter are agreeable with the plan   Follow-up instructions:Patient will be getting blood work through her primary care doctor as well as cardiology in the future.  I will arrange a video visit with the patient in 3 months time   I discussed the assessment and treatment plan with the patient. The patient was provided an opportunity to ask questions and all were answered. The patient agreed with the plan and demonstrated an understanding of the instructions.   The patient was advised to call back or seek an in-person evaluation if the symptoms worsen or if the condition fails to improve as anticipated.   Visit Diagnosis: 1. Macrocytic anemia   2. MDS (  myelodysplastic syndrome) ([place])     Dr. Randa Evens, MD, MPH [facility] at [facility] [phone] 02/25/2019 9:17 AM

## 2019-03-04 ENCOUNTER — Other Ambulatory Visit: Payer: Self-pay | Admitting: Adult Health

## 2019-05-24 ENCOUNTER — Telehealth: Payer: Self-pay | Admitting: Adult Health

## 2019-05-24 NOTE — Telephone Encounter (Signed)
FYI-Pt's daughter called wanting to inform the office that the pt's hearing has gotten worse even with the hearing aid and if someone is to speak to her they have to speak very loudly and slowly or else the pt gets overwhelmed and will not know how to answer.

## 2019-06-07 DIAGNOSIS — F3340 Major depressive disorder, recurrent, in remission, unspecified: Secondary | ICD-10-CM | POA: Insufficient documentation

## 2019-06-08 ENCOUNTER — Other Ambulatory Visit: Payer: Self-pay

## 2019-06-08 ENCOUNTER — Inpatient Hospital Stay: Payer: Medicare Other | Attending: Oncology | Admitting: Oncology

## 2019-06-08 DIAGNOSIS — D539 Nutritional anemia, unspecified: Secondary | ICD-10-CM | POA: Diagnosis not present

## 2019-06-13 NOTE — Progress Notes (Signed)
I connected with Yvonne Parrish on 06/13/19 at  2:15 PM EDT by telephone visit and verified that I am speaking with the correct person using two identifiers.   I discussed the limitations, risks, security and privacy concerns of performing an evaluation and management service by telemedicine and the availability of in-person appointments. I also discussed with the patient that there may be a patient responsible charge related to this service. The patient expressed understanding and agreed to proceed.  Other persons participating in the visit and their role in the encounter:  atients daughter  Patient's location:  home Provider's location:  work  Risk analyst Complaint:  routin /u of anemia  History of present illness:  Patient is a 85 year old female with a past medical history significant for hypertension, hyperlipidemia, CHF, interstitial lung disease, CAD status post CABG and osteoporosis among other medical problems.  Patient's daughter states that she was also diagnosed with myelodysplastic syndrome many years ago on a bone marrow biopsy by Dr. Durenda Hurt here in [place].  She has not required any treatment for this.  Patient was noted to be anemic recently and given her history of MDS has been referred to [place] for further management.  Most recent CBC from 01/27/2019 showed white count of 7.4 with a normal differential, H&H of 10.9/33.1 with an MCV of 130 and a platelet count of 165.  Of note her hemoglobin at baseline is around 11.  CMP was within normal limits.  Iron study showed a ferritin level of 76.  TSH was normal.  Patient is elderly and frail and has chronic exertional shortness of breath.  She had an episode of possible syncope on 02/04/2019 and was evaluated by cardiology.  Interval history Patients daughter provides history. Reports patient has been doing well. No falls. Fatigue is chronic but has remained stable   Review of Systems  Constitutional: Positive for malaise/fatigue. Negative for  chills, fever and weight loss.  HENT: Negative for congestion, ear discharge and nosebleeds.   Eyes: Negative for blurred vision.  Respiratory: Negative for cough, hemoptysis, sputum production, shortness of breath and wheezing.   Cardiovascular: Negative for chest pain, palpitations, orthopnea and claudication.  Gastrointestinal: Negative for abdominal pain, blood in stool, constipation, diarrhea, heartburn, melena, nausea and vomiting.  Genitourinary: Negative for dysuria, flank pain, frequency, hematuria and urgency.  Musculoskeletal: Negative for back pain, joint pain and myalgias.  Skin: Negative for rash.  Neurological: Negative for dizziness, tingling, focal weakness, seizures, weakness and headaches.  Endo/Heme/Allergies: Does not bruise/bleed easily.  Psychiatric/Behavioral: Negative for depression and suicidal ideas. The patient does not have insomnia.     No Known Allergies  Past Medical History:  Diagnosis Date  . Arthritis   . Asthma in child    question of interstitial lung disease  . CHF (congestive heart failure) ([place])   . Chronic back pain   . Chronic pain of right knee    for past 30 years  . Heart murmur   . Heartburn   . History of right foot drop   . Hypertension   . Hypothyroidism   . Myelodysplastic syndrome ([place])   . Stroke (cerebrum) ([place]) 08/2016    Past Surgical History:  Procedure Laterality Date  . CORONARY ANGIOPLASTY    . CORONARY ARTERY BYPASS GRAFT  1999  . KNEE ARTHROSCOPY    . knee fracture     Right knee pinning in '90's   . LUMBAR SPINE SURGERY    . TONSILLECTOMY      Social  History   Socioeconomic History  . Marital status: Widowed    Spouse name: Not on file  . Number of children: Not on file  . Years of education: Not on file  . Highest education level: Not on file  Occupational History  . Not on file  Social Needs  . Financial resource strain: Not on file  . Food insecurity    Worry: Not on file    Inability: Not on  file  . Transportation needs    Medical: Not on file    Non-medical: Not on file  Tobacco Use  . Smoking status: Never Smoker  . Smokeless tobacco: Never Used  Substance and Sexual Activity  . Alcohol use: No  . Drug use: No  . Sexual activity: Never  Lifestyle  . Physical activity    Days per week: Not on file    Minutes per session: Not on file  . Stress: Not on file  Relationships  . Social Herbalist on phone: Not on file    Gets together: Not on file    Attends religious service: Not on file    Active member of club or organization: Not on file    Attends meetings of clubs or organizations: Not on file    Relationship status: Not on file  . Intimate partner violence    Fear of current or ex partner: Not on file    Emotionally abused: Not on file    Physically abused: Not on file    Forced sexual activity: Not on file  Other Topics Concern  . Not on file  Social History Narrative  . Not on file    Family History  Problem Relation Age of Onset  . Stroke Mother   . Heart disease Father   . Kidney cancer Neg Hx   . Bladder Cancer Neg Hx   . Kidney disease Neg Hx      Current Outpatient Medications:  .  albuterol (PROVENTIL HFA;VENTOLIN HFA) 108 (90 Base) MCG/ACT inhaler, Inhale 1-2 puffs into the lungs every 4 (four) hours as needed for wheezing., Disp: , Rfl:  .  amLODipine (NORVASC) 5 MG tablet, Take 1 tablet (5 mg total) by mouth daily., Disp: 30 tablet, Rfl: 0 .  aspirin EC 325 MG tablet, Take by mouth., Disp: , Rfl:  .  atorvastatin (LIPITOR) 40 MG tablet, Take 1 tablet (40 mg total) by mouth daily at 6 PM., Disp: 90 tablet, Rfl: 3 .  azelastine (ASTELIN) 0.1 % nasal spray, Place 2 sprays into both nostrils daily as needed for rhinitis or allergies. , Disp: , Rfl:  .  Calcium Carbonate-Vitamin D 600-400 MG-UNIT tablet, Take by mouth., Disp: , Rfl:  .  Cholecalciferol (VITAMIN D-3) 1000 units CAPS, Take 1,000 Units by mouth daily., Disp: , Rfl:  .   Cyanocobalamin (B-12 PO), Take 1,000 mg by mouth., Disp: , Rfl:  .  cyclobenzaprine (FLEXERIL) 5 MG tablet, Take 1 tablet by mouth 3 (three) times daily as needed., Disp: , Rfl: 0 .  famotidine (PEPCID) 20 MG tablet, Take 20 mg by mouth 2 (two) times daily., Disp: , Rfl:  .  FLUoxetine (PROZAC) 10 MG capsule, Take 1 capsule (10 mg total) by mouth daily. (Patient taking differently: Take 20 mg by mouth daily. ), Disp: 30 capsule, Rfl: 0 .  hydrochlorothiazide (HYDRODIURIL) 12.5 MG tablet, Take 12.5 mg by mouth daily., Disp: , Rfl:  .  HYDROcodone-acetaminophen (NORCO) 7.5-325 MG tablet, TK 1 T  PO ONCE D PRN P, Disp: , Rfl: 0 .  levothyroxine (SYNTHROID, LEVOTHROID) 88 MCG tablet, Take 88 mcg by mouth daily before breakfast., Disp: , Rfl:  .  lisinopril (PRINIVIL,ZESTRIL) 40 MG tablet, Take 1 tablet (40 mg total) by mouth daily., Disp: 30 tablet, Rfl: 0 .  loratadine (CLARITIN) 10 MG tablet, Take 10 mg by mouth daily., Disp: , Rfl:  .  metoprolol succinate (TOPROL-XL) 25 MG 24 hr tablet, Take 1 tablet by mouth daily., Disp: , Rfl:  .  polyethylene glycol powder (GLYCOLAX/MIRALAX) powder, Take 17 g by mouth daily., Disp: , Rfl:  .  potassium chloride (K-DUR) 10 MEQ tablet, Take 10 mEq by mouth daily., Disp: , Rfl:   No results found.  No images are attached to the encounter.   CMP Latest Ref Rng & Units 01/14/2017  Glucose 65 - 99 mg/dL -  BUN 8 - 27 mg/dL 11  Creatinine 0.57 - 1.00 mg/dL 0.87  Sodium 135 - 145 mmol/L -  Potassium 3.5 - 5.1 mmol/L -  Chloride 101 - 111 mmol/L -  CO2 22 - 32 mmol/L -  Calcium 8.9 - 10.3 mg/dL -  Total Protein 6.5 - 8.1 g/dL -  Total Bilirubin 0.3 - 1.2 mg/dL -  Alkaline Phos 38 - 126 U/L -  AST 15 - 41 U/L -  ALT 14 - 54 U/L -   CBC Latest Ref Rng & Units 09/05/2016  WBC 4.0 - 10.5 K/uL 7.0  Hemoglobin 12.0 - 15.0 g/dL 10.5(L)  Hematocrit 36.0 - 46.0 % 32.0(L)  Platelets 150 - 400 K/uL 252     Assessment and plan:patient is a 85 yr old female with  chronic macrocytic anemia likely due to MDS  Patient has a normal wbc and plateletcount. She has chronic macrocytosis but hb has remained stable between 10-11. This can be moitored by DR. Hande. If there is a decline in her counts, she can be referred to use in the future. Patients daughter who is patients caregiver feels comfortable about this decision  Follow-up instructions:no follow up needed  I discussed the assessment and treatment plan with the patient. The patient was provided an opportunity to ask questions and all were answered. The patient agreed with the plan and demonstrated an understanding of the instructions.   The patient was advised to call back or seek an in-person evaluation if the symptoms worsen or if the condition fails to improve as anticipated.  I provided 12 minutes of non face-to-face telephone visit time during this encounter, and > 50% was spent counseling as documented under my assessment & plan.  Visit Diagnosis: 1. Macrocytic anemia     Dr. Randa Evens, MD, MPH [facility] [phone] 06/13/2019 3:30 PM

## 2019-08-25 ENCOUNTER — Ambulatory Visit: Payer: Medicare Other | Admitting: Adult Health

## 2019-11-27 ENCOUNTER — Ambulatory Visit: Payer: Medicare Other | Attending: Internal Medicine

## 2019-11-27 DIAGNOSIS — Z23 Encounter for immunization: Secondary | ICD-10-CM

## 2019-11-27 NOTE — Progress Notes (Signed)
   Covid-19 Vaccination Clinic  Name:  MCKENAH REESE    MRN: [id number] DOB: [birth year]  11/27/2019  Ms. Reichle was observed post Covid-19 immunization for 15 minutes without incident. She was provided with Vaccine Information Sheet and instruction to access the V-Safe system.   Ms. Harakal was instructed to call 911 with any severe reactions post vaccine: Marland Kitchen Difficulty breathing  . Swelling of face and throat  . A fast heartbeat  . A bad rash all over body  . Dizziness and weakness   Immunizations Administered    Name Date Dose VIS Date Route   Pfizer COVID-19 Vaccine 11/27/2019  1:05 PM 0.3 mL 07/23/2019 Intramuscular   Manufacturer: [employer]   Lot: [id number]   [employer]: [id number]

## 2019-12-21 ENCOUNTER — Ambulatory Visit: Payer: Medicare Other | Attending: Internal Medicine

## 2019-12-21 DIAGNOSIS — Z23 Encounter for immunization: Secondary | ICD-10-CM

## 2019-12-21 NOTE — Progress Notes (Signed)
   Covid-19 Vaccination Clinic  Name:  Yvonne Parrish    MRN: [id number] DOB: Apr 03, 1929  12/21/2019  [name] was observed post Covid-19 immunization for 15 minutes without incident. She was provided with Vaccine Information Sheet and instruction to access the V-Safe system.   [name] was instructed to call 911 with any severe reactions post vaccine: Marland Kitchen Difficulty breathing  . Swelling of face and throat  . A fast heartbeat  . A bad rash all over body  . Dizziness and weakness   Immunizations Administered    Name Date Dose VIS Date Route   Pfizer COVID-19 Vaccine 12/21/2019 12:00 PM 0.3 mL 10/06/2018 Intramuscular   Manufacturer: [place]   Lot: [id number]   [place]: [id number]

## 2020-01-17 DIAGNOSIS — I5189 Other ill-defined heart diseases: Secondary | ICD-10-CM | POA: Insufficient documentation

## 2020-06-06 NOTE — Progress Notes (Signed)
06/07/2020 3:15 PM   Yvonne Parrish 1929/01/16 [id number]  Referring provider: Tracie Harrier, MD [address] [facility] [place],  [place] [address] Chief Complaint  Patient presents with  . Hematuria    HPI: Yvonne Parrish is a 86 y.o. female who presents today for evaluation and management of hematuria.    Patient has had persistent hematuria since 12/2015. Patient was noted to have hematuria back in 02/2017, cystoscopy and CTU were negative.   Routine UA on 05/23/2020 showed moderate blood and rare bacteria. Urine culture was normal.   Today the patient is doing well. She reports frequent urination. She has started to see streaky light pink blood on her incontinence pad which she attributes to her urine which is new.  No clots.  She does not see any blood mixed in with her urine in the toilet.   She is changing soaked pads every 1-1.5 hours. She is wearing size 5 pads now. Her daughter notes she has struggled with urge incontinence since stroke in 2018. Her daughter notes the incontinence is the most bothersome symptom.   Denies dysuria. She has never tried anything for her incontinence.   PVR today 13 cc  She notes her mother is tired all the time which is not something new.  She is very fatigued by even coming to the doctor.  She would like to limit the number of tests that she does.   PMH: Past Medical History:  Diagnosis Date  . Arthritis   . Asthma in child    question of interstitial lung disease  . CHF (congestive heart failure) ([place])   . Chronic back pain   . Chronic pain of right knee    for past 30 years  . Heart murmur   . Heartburn   . History of right foot drop   . Hypertension   . Hypothyroidism   . Myelodysplastic syndrome ([place])   . Stroke (cerebrum) ([place]) 08/2016    Surgical History: Past Surgical History:  Procedure Laterality Date  . CORONARY ANGIOPLASTY    . CORONARY ARTERY BYPASS GRAFT  1999  . KNEE ARTHROSCOPY    .  knee fracture     Right knee pinning in '90's   . LUMBAR SPINE SURGERY    . TONSILLECTOMY      Home Medications:  Allergies as of 06/07/2020   No Known Allergies     Medication List       Accurate as of June 07, 2020  3:15 PM. If you have any questions, ask your nurse or doctor.        STOP taking these medications   B-12 PO Stopped by: Hollice Espy, MD   cyclobenzaprine 5 MG tablet Commonly known as: FLEXERIL Stopped by: Hollice Espy, MD   Fluoxetine HCl (PMDD) 10 MG Tabs Stopped by: Hollice Espy, MD   HYDROcodone-acetaminophen 7.5-325 MG tablet Commonly known as: NORCO Stopped by: Hollice Espy, MD   LORazepam 0.5 MG tablet Commonly known as: ATIVAN Stopped by: Hollice Espy, MD   Vitamin D-3 25 MCG (1000 UT) Caps Stopped by: Hollice Espy, MD     TAKE these medications   albuterol 108 (90 Base) MCG/ACT inhaler Commonly known as: VENTOLIN HFA Inhale 1-2 puffs into the lungs every 4 (four) hours as needed for wheezing.   amLODipine 5 MG tablet Commonly known as: NORVASC Take 1 tablet (5 mg total) by mouth daily.   aspirin EC 325 MG tablet Take by mouth.  Astelin 0.1 % nasal spray Generic drug: azelastine Place 2 sprays into both nostrils daily as needed for rhinitis or allergies.   atorvastatin 40 MG tablet Commonly known as: LIPITOR Take 1 tablet (40 mg total) by mouth daily at 6 PM.   atorvastatin 20 MG tablet Commonly known as: LIPITOR Take 20 mg by mouth daily.   Calcium Carbonate-Vitamin D 600-400 MG-UNIT tablet Take by mouth. What changed: Another medication with the same name was removed. Continue taking this medication, and follow the directions you see here. Changed by: Hollice Espy, MD   famotidine 20 MG tablet Commonly known as: PEPCID Take 20 mg by mouth 2 (two) times daily.   Flovent HFA 44 MCG/ACT inhaler Generic drug: fluticasone Inhale into the lungs.   FLUoxetine 40 MG capsule Commonly known as:  PROZAC Take 40 mg by mouth daily. What changed: Another medication with the same name was removed. Continue taking this medication, and follow the directions you see here. Changed by: Hollice Espy, MD   hydrochlorothiazide 12.5 MG tablet Commonly known as: HYDRODIURIL Take 12.5 mg by mouth daily.   ipratropium 0.06 % nasal spray Commonly known as: ATROVENT Place into the nose.   levothyroxine 88 MCG tablet Commonly known as: SYNTHROID Take 88 mcg by mouth daily before breakfast.   lisinopril 10 MG tablet Commonly known as: ZESTRIL Take 20 mg by mouth daily. What changed: Another medication with the same name was removed. Continue taking this medication, and follow the directions you see here. Changed by: Hollice Espy, MD   loratadine 10 MG tablet Commonly known as: CLARITIN Take 10 mg by mouth daily.   metoprolol succinate 25 MG 24 hr tablet Commonly known as: TOPROL-XL Take 1 tablet by mouth daily.   polyethylene glycol powder 17 GM/SCOOP powder Commonly known as: GLYCOLAX/MIRALAX Take 17 g by mouth daily.   potassium chloride 10 MEQ tablet Commonly known as: KLOR-CON Take 10 mEq by mouth daily.       Allergies: No Known Allergies  Family History: Family History  Problem Relation Age of Onset  . Stroke Mother   . Heart disease Father   . Kidney cancer Neg Hx   . Bladder Cancer Neg Hx   . Kidney disease Neg Hx     Social History:  reports that she has never smoked. She has never used smokeless tobacco. She reports that she does not drink alcohol and does not use drugs.   Physical Exam: BP 140/80   Pulse 70   Ht 5\' 1"  (1.549 m)   Wt 147 lb (66.7 kg)   BMI 27.78 kg/m   Constitutional:  Alert and oriented, No acute distress.  Ulnarly in wheelchair, somewhat hard of hearing accompanied by daughter but witty. HEENT: [place] AT, moist mucus membranes.  Trachea midline, no masses. Cardiovascular: No clubbing, cyanosis, or edema. Respiratory: Normal  respiratory effort, no increased work of breathing. Skin: No rashes, bruises or suspicious lesions. Neurologic: Grossly intact, no focal deficits, moving all 4 extremities. Psychiatric: Normal mood and affect.  Laboratory Data: Unable to void for specimen, urinalysis from last week at PCP reviewed, completely negative.  She did have a urinalysis dating back to 01/2020 with 4-10 red blood cells per high-powered field.   Pertinent Imaging: Results for orders placed or performed in visit on 06/07/20  BLADDER SCAN AMB NON-IMAGING  Result Value Ref Range   Scan Result 61ml      Assessment & Plan:    1. Gross hematuria/microscopic hematuria Unclear based on  the nature whether this is related to urinary tract but she does have evidence of microscopic hematuria Persistent microscopic hematuria on UA's.   CTU and cystoscopy from 02/2017 were negative.  Recommend modified hematuria work up with cystoscopy to help rule out any significant pathology also in the setting of acutely worsened urge incontinence, she is agreeable this plan Will hold off on renal ultrasound at this time as she would not likely want to have surgery even if pathology is identified  2. Urge incontinence  Worsening symptoms-significant clinical bother Adequate emptying, no evidence of overflow incontinence Patient is changing pads every 1-1.5 hours.  -Given 4 Myrbetriq 50 mg daily, # 28 samples; I have advised the patient of the side effects of Myrbetriq, such as: elevation in BP, urinary retention and/or HA   RTC to reevaluate urinary symptoms and flexible cystoscopy  [facility] [address], [place], [place] [address] [phone]  I, Selena Batten, am acting as a scribe for Dr. Hollice Espy.  I have reviewed the above documentation for accuracy and completeness, and I agree with the above.   Hollice Espy, MD

## 2020-06-07 ENCOUNTER — Ambulatory Visit (INDEPENDENT_AMBULATORY_CARE_PROVIDER_SITE_OTHER): Payer: Medicare Other | Admitting: Urology

## 2020-06-07 ENCOUNTER — Other Ambulatory Visit: Payer: Self-pay

## 2020-06-07 VITALS — BP 140/80 | HR 70 | Ht 61.0 in | Wt 147.0 lb

## 2020-06-07 DIAGNOSIS — N3941 Urge incontinence: Secondary | ICD-10-CM

## 2020-06-07 DIAGNOSIS — R319 Hematuria, unspecified: Secondary | ICD-10-CM | POA: Diagnosis not present

## 2020-06-07 LAB — BLADDER SCAN AMB NON-IMAGING

## 2020-06-07 NOTE — Patient Instructions (Signed)
Cystoscopy Cystoscopy is a procedure that is used to help diagnose and sometimes treat conditions that affect the lower urinary tract. The lower urinary tract includes the bladder and the urethra. The urethra is the tube that drains urine from the bladder. Cystoscopy is done using a thin, tube-shaped instrument with a light and camera at the end (cystoscope). The cystoscope may be hard or flexible, depending on the goal of the procedure. The cystoscope is inserted through the urethra, into the bladder. Cystoscopy may be recommended if you have:  Urinary tract infections that keep coming back.  Blood in the urine (hematuria).  An inability to control when you urinate (urinary incontinence) or an overactive bladder.  Unusual cells found in a urine sample.  A blockage in the urethra, such as a urinary stone.  Painful urination.  An abnormality in the bladder found during an intravenous pyelogram (IVP) or CT scan. Cystoscopy may also be done to remove a sample of tissue to be examined under a microscope (biopsy). What are the risks? Generally, this is a safe procedure. However, problems may occur, including:  Infection.  Bleeding.  What happens during the procedure?  1. You will be given one or more of the following: ? A medicine to numb the area (local anesthetic). 2. The area around the opening of your urethra will be cleaned. 3. The cystoscope will be passed through your urethra into your bladder. 4. Germ-free (sterile) fluid will flow through the cystoscope to fill your bladder. The fluid will stretch your bladder so that your health care provider can clearly examine your bladder walls. 5. Your doctor will look at the urethra and bladder. 6. The cystoscope will be removed The procedure may vary among health care providers  What can I expect after the procedure? After the procedure, it is common to have: 1. Some soreness or pain in your abdomen and urethra. 2. Urinary symptoms.  These include: ? Mild pain or burning when you urinate. Pain should stop within a few minutes after you urinate. This may last for up to 1 week. ? A small amount of blood in your urine for several days. ? Feeling like you need to urinate but producing only a small amount of urine. Follow these instructions at home: General instructions  Return to your normal activities as told by your health care provider.   Do not drive for 24 hours if you were given a sedative during your procedure.  Watch for any blood in your urine. If the amount of blood in your urine increases, call your health care provider.  If a tissue sample was removed for testing (biopsy) during your procedure, it is up to you to get your test results. Ask your health care provider, or the department that is doing the test, when your results will be ready.  Drink enough fluid to keep your urine pale yellow.  Keep all follow-up visits as told by your health care provider. This is important. Contact a health care provider if you:  Have pain that gets worse or does not get better with medicine, especially pain when you urinate.  Have trouble urinating.  Have more blood in your urine. Get help right away if you:  Have blood clots in your urine.  Have abdominal pain.  Have a fever or chills.  Are unable to urinate. Summary  Cystoscopy is a procedure that is used to help diagnose and sometimes treat conditions that affect the lower urinary tract.  Cystoscopy is done using   a thin, tube-shaped instrument with a light and camera at the end.  After the procedure, it is common to have some soreness or pain in your abdomen and urethra.  Watch for any blood in your urine. If the amount of blood in your urine increases, call your health care provider.  If you were prescribed an antibiotic medicine, take it as told by your health care provider. Do not stop taking the antibiotic even if you start to feel better. This  information is not intended to replace advice given to you by your health care provider. Make sure you discuss any questions you have with your health care provider. Document Revised: 07/21/2018 Document Reviewed: 07/21/2018 Elsevier Patient Education  2020 Elsevier Inc.   

## 2020-06-08 LAB — MICROSCOPIC EXAMINATION: Bacteria, UA: NONE SEEN

## 2020-06-08 LAB — URINALYSIS, COMPLETE
Bilirubin, UA: NEGATIVE
Glucose, UA: NEGATIVE
Ketones, UA: NEGATIVE
Leukocytes,UA: NEGATIVE
Nitrite, UA: NEGATIVE
Specific Gravity, UA: 1.01 (ref 1.005–1.030)
Urobilinogen, Ur: 0.2 mg/dL (ref 0.2–1.0)
pH, UA: 6.5 (ref 5.0–7.5)

## 2020-07-04 ENCOUNTER — Other Ambulatory Visit: Payer: Self-pay | Admitting: Urology

## 2020-07-11 ENCOUNTER — Other Ambulatory Visit: Payer: Self-pay | Admitting: Urology

## 2020-10-18 ENCOUNTER — Ambulatory Visit (INDEPENDENT_AMBULATORY_CARE_PROVIDER_SITE_OTHER): Payer: Medicare Other | Admitting: Nurse Practitioner

## 2020-10-18 ENCOUNTER — Other Ambulatory Visit: Payer: Self-pay

## 2020-10-18 ENCOUNTER — Encounter (INDEPENDENT_AMBULATORY_CARE_PROVIDER_SITE_OTHER): Payer: Self-pay | Admitting: Nurse Practitioner

## 2020-10-18 VITALS — BP 154/69 | HR 61 | Resp 16 | Ht 61.0 in | Wt 130.0 lb

## 2020-10-18 DIAGNOSIS — M79671 Pain in right foot: Secondary | ICD-10-CM | POA: Diagnosis not present

## 2020-10-18 DIAGNOSIS — I10 Essential (primary) hypertension: Secondary | ICD-10-CM

## 2020-10-18 DIAGNOSIS — I890 Lymphedema, not elsewhere classified: Secondary | ICD-10-CM

## 2020-10-18 DIAGNOSIS — I5022 Chronic systolic (congestive) heart failure: Secondary | ICD-10-CM | POA: Diagnosis not present

## 2020-10-25 ENCOUNTER — Other Ambulatory Visit (INDEPENDENT_AMBULATORY_CARE_PROVIDER_SITE_OTHER): Payer: Self-pay | Admitting: Nurse Practitioner

## 2020-10-25 DIAGNOSIS — R600 Localized edema: Secondary | ICD-10-CM

## 2020-10-26 ENCOUNTER — Other Ambulatory Visit (INDEPENDENT_AMBULATORY_CARE_PROVIDER_SITE_OTHER): Payer: Self-pay | Admitting: Nurse Practitioner

## 2020-10-26 DIAGNOSIS — M79606 Pain in leg, unspecified: Secondary | ICD-10-CM

## 2020-10-27 ENCOUNTER — Ambulatory Visit (INDEPENDENT_AMBULATORY_CARE_PROVIDER_SITE_OTHER): Payer: Medicare Other

## 2020-10-27 ENCOUNTER — Ambulatory Visit (INDEPENDENT_AMBULATORY_CARE_PROVIDER_SITE_OTHER): Payer: Medicare Other | Admitting: Nurse Practitioner

## 2020-10-27 ENCOUNTER — Encounter (INDEPENDENT_AMBULATORY_CARE_PROVIDER_SITE_OTHER): Payer: Self-pay | Admitting: Nurse Practitioner

## 2020-10-27 ENCOUNTER — Other Ambulatory Visit: Payer: Self-pay

## 2020-10-27 VITALS — BP 148/71 | HR 60 | Resp 16

## 2020-10-27 DIAGNOSIS — I10 Essential (primary) hypertension: Secondary | ICD-10-CM | POA: Diagnosis not present

## 2020-10-27 DIAGNOSIS — M79606 Pain in leg, unspecified: Secondary | ICD-10-CM

## 2020-10-27 DIAGNOSIS — R600 Localized edema: Secondary | ICD-10-CM

## 2020-10-27 DIAGNOSIS — E785 Hyperlipidemia, unspecified: Secondary | ICD-10-CM

## 2020-10-27 DIAGNOSIS — I739 Peripheral vascular disease, unspecified: Secondary | ICD-10-CM | POA: Diagnosis not present

## 2020-10-27 NOTE — Progress Notes (Signed)
Subjective:    Patient ID: Yvonne Parrish, female    DOB: 10/03/1928, 87 y.o.   MRN: [id number] Chief Complaint  Patient presents with  . New Patient (Initial Visit)    Ref Drane ble edema    Yvonne Parrish is an 87 year old female that presents today for evaluation of lower extremity swelling.  The patient was previously seen by this office many years ago.  In reviewing records, I am unable to find an office visit after 2013.  The patient previously had a left renal artery stent placed due to renal artery stenosis patient was also follow-up for a chronic type B thoracic aortic aneurysm.  However the patient has not followed up in some time.  She follows up today due to lower extremity edema.  She notes that her feet and ankles have been swelling more recently.  She has had recent medication changes due to her blood pressure.  They want to take her off of hydrochlorothiazide but due to her swelling they were unable to do so.  This leg swelling is more so of a recent development.  Additionally, the patient complains of severe pain in the right lower extremity.  She notes that the leg jerks in there is an electric almost stabbing sensation in her legs at times.  It becomes very tender to the touch in the foot area.  This pain comes and goes in waves.  She notes that coolness against her foot helps him feel better.  Patient also has a previous history of foot drop as well.  She denies any fever or chills.  She denies any claudication-like symptoms.  She denies any rest pain.   Review of Systems  Cardiovascular: Positive for leg swelling.  Musculoskeletal: Positive for arthralgias, gait problem and myalgias.  All other systems reviewed and are negative.      Objective:   Physical Exam Vitals reviewed.  HENT:     Head: Normocephalic.  Cardiovascular:     Rate and Rhythm: Normal rate.     Pulses: Decreased pulses.  Pulmonary:     Effort: Pulmonary effort is normal.  Musculoskeletal:         General: Swelling and tenderness present.  Skin:    General: Skin is dry.  Neurological:     Mental Status: She is alert and oriented to person, place, and time.     Motor: Weakness present.     Gait: Gait abnormal.  Psychiatric:        Mood and Affect: Mood normal.        Behavior: Behavior normal.        Thought Content: Thought content normal.        Judgment: Judgment normal.     BP (!) 154/69 (BP Location: Right Arm)   Pulse 61   Resp 16   Ht 5\' 1"  (1.549 m)   Wt 130 lb (59 kg)   BMI 24.56 kg/m   Past Medical History:  Diagnosis Date  . Arthritis   . Asthma in child    question of interstitial lung disease  . CHF (congestive heart failure) ([place])   . Chronic back pain   . Chronic pain of right knee    for past 30 years  . Heart murmur   . Heartburn   . History of right foot drop   . Hypertension   . Hypothyroidism   . Myelodysplastic syndrome ([place])   . Stroke (cerebrum) ([place]) 08/2016    Social History  Socioeconomic History  . Marital status: Widowed    Spouse name: Not on file  . Number of children: Not on file  . Years of education: Not on file  . Highest education level: Not on file  Occupational History  . Not on file  Tobacco Use  . Smoking status: Never Smoker  . Smokeless tobacco: Never Used  Substance and Sexual Activity  . Alcohol use: No  . Drug use: No  . Sexual activity: Never  Other Topics Concern  . Not on file  Social History Narrative  . Not on file   Social Determinants of Health   Financial Resource Strain: Not on file  Food Insecurity: Not on file  Transportation Needs: Not on file  Physical Activity: Not on file  Stress: Not on file  Social Connections: Not on file  Intimate Partner Violence: Not on file    Past Surgical History:  Procedure Laterality Date  . CORONARY ANGIOPLASTY    . CORONARY ARTERY BYPASS GRAFT  1999  . KNEE ARTHROSCOPY    . knee fracture     Right knee pinning in '90's   . LUMBAR SPINE  SURGERY    . TONSILLECTOMY      Family History  Problem Relation Age of Onset  . Stroke Mother   . Heart disease Father   . Kidney cancer Neg Hx   . Bladder Cancer Neg Hx   . Kidney disease Neg Hx     Allergies  Allergen Reactions  . Food [Wheat Bran] Shortness Of Breath and Itching    Sweet potatoes    CBC Latest Ref Rng & Units 09/05/2016 08/29/2016 08/26/2016  WBC 4.0 - 10.5 K/uL 7.0 10.2 9.7  Hemoglobin 12.0 - 15.0 g/dL 10.5(L) 11.7(L) 11.8(L)  Hematocrit 36.0 - 46.0 % 32.0(L) 35.7(L) 34.6(L)  Platelets 150 - 400 K/uL 252 298 254      CMP     Component Value Date/Time   NA 136 09/05/2016 0547   K 3.7 09/05/2016 0547   CL 104 09/05/2016 0547   CO2 24 09/05/2016 0547   GLUCOSE 116 (H) 09/05/2016 0547   BUN 11 01/14/2017 1110   CREATININE 0.87 01/14/2017 1110   CALCIUM 9.2 09/05/2016 0547   PROT 6.8 08/23/2016 0642   ALBUMIN 3.1 (L) 08/23/2016 0642   AST 19 08/23/2016 0642   ALT 10 (L) 08/23/2016 0642   ALKPHOS 76 08/23/2016 0642   BILITOT 0.9 08/23/2016 0642   GFRNONAA 61 01/14/2017 1110   GFRAA 70 01/14/2017 1110     No results found.     Assessment & Plan:   1. Primary hypertension Continue antihypertensive medications as already ordered, these medications have been reviewed and there are no changes at this time.   2. Chronic systolic CHF (congestive heart failure) ([place]) This certainly can be a contributing factor to the patient's lower extremity edema.  3. Right foot pain Based upon the description of the pain as well as what was seen here in office today, it is likely this is related to a neurological component.  However to be absolutely certain we will perform lower extremity arterial studies in order to ensure the patient has sufficient circulation this is not related to lack of perfusion.  4. Lymphedema Previously the patient has had very good control of her lower extremity edema.  We will perform a venous reflux study to ensure there has been  no changes within her venous function causing lower extremity swelling.  This may also be  related to other comorbid conditions such as the patient's heart failure and she also has some chronic kidney disease as well.  The patient is advised to continue with conservative therapy until her follow-up at which time we can discuss options related to edema such as a lymphedema pump.   Current Outpatient Medications on File Prior to Visit  Medication Sig Dispense Refill  . albuterol (PROVENTIL HFA;VENTOLIN HFA) 108 (90 Base) MCG/ACT inhaler Inhale 1-2 puffs into the lungs every 4 (four) hours as needed for wheezing.    Marland Kitchen aspirin EC 325 MG tablet Take by mouth.    Marland Kitchen azelastine (ASTELIN) 0.1 % nasal spray Place 2 sprays into both nostrils daily as needed for rhinitis or allergies.     . Calcium Carbonate-Vitamin D 600-400 MG-UNIT tablet Take by mouth.    . famotidine (PEPCID) 20 MG tablet Take 20 mg by mouth 2 (two) times daily.    Marland Kitchen FLOVENT HFA 44 MCG/ACT inhaler Inhale into the lungs.    Marland Kitchen FLUoxetine (PROZAC) 40 MG capsule Take 40 mg by mouth daily.    . hydrochlorothiazide (HYDRODIURIL) 25 MG tablet Take 25 mg by mouth daily.    . hydrochlorothiazide (HYDRODIURIL) 25 MG tablet Take by mouth.    Marland Kitchen ipratropium (ATROVENT) 0.06 % nasal spray Place into the nose.    . levothyroxine (SYNTHROID, LEVOTHROID) 88 MCG tablet Take 88 mcg by mouth daily before breakfast.    . lisinopril (ZESTRIL) 5 MG tablet Take 5 mg by mouth daily.    Marland Kitchen loratadine (CLARITIN) 10 MG tablet Take 10 mg by mouth daily.    Marland Kitchen LORazepam (ATIVAN) 0.5 MG tablet Take 0.5 mg by mouth daily as needed.    . polyethylene glycol powder (GLYCOLAX/MIRALAX) powder Take 17 g by mouth daily.    . potassium chloride (K-DUR) 10 MEQ tablet Take 10 mEq by mouth daily.    . rosuvastatin (CRESTOR) 5 MG tablet Take 5 mg by mouth 3 (three) times a week.    Marland Kitchen amLODipine (NORVASC) 5 MG tablet Take 1 tablet (5 mg total) by mouth daily. (Patient not taking:  Reported on 10/18/2020) 30 tablet 0  . atorvastatin (LIPITOR) 20 MG tablet Take 20 mg by mouth daily.    Marland Kitchen atorvastatin (LIPITOR) 40 MG tablet Take 1 tablet (40 mg total) by mouth daily at 6 PM. 90 tablet 3  . lisinopril (ZESTRIL) 10 MG tablet Take 20 mg by mouth daily.     . metoprolol succinate (TOPROL-XL) 25 MG 24 hr tablet Take 1 tablet by mouth daily.     No current facility-administered medications on file prior to visit.    There are no Patient Instructions on file for this visit. No follow-ups on file.   Kris Hartmann, NP

## 2020-11-04 NOTE — Progress Notes (Signed)
Subjective:    Patient ID: Yvonne Parrish, female    DOB: 1929-03-22, 87 y.o.   MRN: [id number] Chief Complaint  Patient presents with  . Follow-up    Ultrasound follow up    Yvonne Parrish is an 87 year old female that presents today for evaluation of lower extremity swelling.  The patient was previously seen by this office many years ago.  In reviewing records, I am unable to find an office visit after 2013.  The patient previously had a left renal artery stent placed due to renal artery stenosis patient was also follow-up for a chronic type B thoracic aortic aneurysm.  At the most recent office visit the patient had severe pain in the right lower extremity.  There was an almost electrical stabbing sensation in her leg at times.  The patient has severe jerks.  The patient's daughter notes that she utilized a lidocaine patch and Tylenol and after that the pain went away.  The pain has not recurred since that day.  The patient also continues to have swelling of the lower extremities.  The patient denies any claudication-like symptoms however he does not ambulate frequently.  Noninvasive study showed no evidence of DVT or superficial venous thrombosis of the bilateral lower extremities.  No evidence of insufficiency superficial venous reflux seen bilaterally.  The patient's right ABI is noncompressible with a left of 0.62.  The patient's right TBI 0.44 with the left of 0.43.  There is no flow detected in the patient's bilateral posterior tibial arteries.  The patient has monophasic waveforms in her bilateral anterior tibial arteries although the left is more dampened.  The peroneal artery on the right is severely dampened.  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     Review of Systems  Cardiovascular: Positive for leg swelling.  Musculoskeletal: Positive for arthralgias and gait problem.  Neurological: Positive for weakness.  All other  systems reviewed and are negative.      Objective:   Physical Exam Vitals reviewed.  HENT:     Head: Normocephalic.  Cardiovascular:     Rate and Rhythm: Normal rate and regular rhythm.     Pulses:          Dorsalis pedis pulses are detected w/ Doppler on the right side and detected w/ Doppler on the left side.  Pulmonary:     Effort: Pulmonary effort is normal.     Breath sounds: Normal breath sounds.  Musculoskeletal:     Right lower leg: 1+ Edema present.     Left lower leg: 1+ Edema present.  Neurological:     Mental Status: She is alert and oriented to person, place, and time.     Motor: Weakness present.     Gait: Gait abnormal.  Psychiatric:        Mood and Affect: Mood normal.        Behavior: Behavior normal.        Thought Content: Thought content normal.        Judgment: Judgment normal.     BP (!) 148/71 (BP Location: Right Arm)   Pulse 60   Resp 16   Past Medical History:  Diagnosis Date  . Arthritis   . Asthma in child    question of interstitial lung disease  . CHF (congestive heart failure) ([place])   . Chronic back pain   . Chronic pain of right knee  for past 30 years  . Heart murmur   . Heartburn   . History of right foot drop   . Hypertension   . Hypothyroidism   . Myelodysplastic syndrome ([place])   . 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 Marital status: Widowed    Spouse name: Not on file  . Number of children: Not on file  . Years of education: Not on file  . Highest education level: Not on file  Occupational History  . Not on file  Tobacco Use  . Smoking status: Never Smoker  . Smokeless tobacco: Never Used  Substance and Sexual Activity  . Alcohol use: No  . Drug use: No  . Sexual activity: Never  Other Topics Concern  . Not on file  Social History Narrative  . Not on file   Social Determinants of Health   Financial Resource Strain: Not on file  Food Insecurity: Not on file   Transportation Needs: Not on file  Physical Activity: Not on file  Stress: Not on file  Social Connections: Not on file  Intimate Partner Violence: Not on file    Past Surgical History:  Procedure Laterality Date  . CORONARY ANGIOPLASTY    . CORONARY ARTERY BYPASS GRAFT  1999  . KNEE ARTHROSCOPY    . knee fracture     Right knee pinning in '90's   . LUMBAR SPINE SURGERY    . TONSILLECTOMY      Family History  Problem Relation Age of Onset  . Stroke Mother   . Heart disease Father   . Kidney cancer Neg Hx   . Bladder Cancer Neg Hx   . Kidney disease Neg Hx     Allergies  Allergen Reactions  . Food [Wheat Bran] Shortness Of Breath and Itching    Sweet potatoes    CBC Latest Ref Rng & Units 09/05/2016 08/29/2016 08/26/2016  WBC 4.0 - 10.5 K/uL 7.0 10.2 9.7  Hemoglobin 12.0 - 15.0 g/dL 10.5(L) 11.7(L) 11.8(L)  Hematocrit 36.0 - 46.0 % 32.0(L) 35.7(L) 34.6(L)  Platelets 150 - 400 K/uL 252 298 254      CMP     Component Value Date/Time   NA 136 09/05/2016 0547   K 3.7 09/05/2016 0547   CL 104 09/05/2016 0547   CO2 24 09/05/2016 0547   GLUCOSE 116 (H) 09/05/2016 0547   BUN 11 01/14/2017 1110   CREATININE 0.87 01/14/2017 1110   CALCIUM 9.2 09/05/2016 0547   PROT 6.8 08/23/2016 0642   ALBUMIN 3.1 (L) 08/23/2016 0642   AST 19 08/23/2016 0642   ALT 10 (L) 08/23/2016 0642   ALKPHOS 76 08/23/2016 0642   BILITOT 0.9 08/23/2016 0642   GFRNONAA 61 01/14/2017 1110   GFRAA 70 01/14/2017 1110     VAS [place] ABI WITH/WO TBI  Result Date: 11/01/2020 LOWER EXTREMITY DOPPLER STUDY Indications: Rest pain.  Performing Technologist: Almira Coaster RVS  Examination Guidelines: A complete evaluation includes at minimum, Doppler waveform signals and systolic blood pressure reading at the level of bilateral brachial, anterior tibial, and posterior tibial arteries, when vessel segments are accessible. Bilateral testing is considered an integral part of a complete examination.  Photoelectric Plethysmograph (PPG) waveforms and toe systolic pressure readings are included as required and additional duplex testing as needed. Limited examinations for reoccurring indications may be performed as noted.  ABI Findings: +---------+------------------+-----+----------+--------+ Right    Rt Pressure (mmHg)IndexWaveform  Comment  +---------+------------------+-----+----------+--------+ Brachial 187                                       +---------+------------------+-----+----------+--------+  ATA      216               1.16 monophasicNC       +---------+------------------+-----+----------+--------+ Great Toe82                0.44 Abnormal           +---------+------------------+-----+----------+--------+ +---------+------------------+-----+----------+-------+ Left     Lt Pressure (mmHg)IndexWaveform  Comment +---------+------------------+-----+----------+-------+ Brachial 99                                       +---------+------------------+-----+----------+-------+ ATA      116               0.62 monophasic        +---------+------------------+-----+----------+-------+ Great Toe81                0.43 Abnormal          +---------+------------------+-----+----------+-------+ +-------+-----------+-----------+------------+------------+ ABI/TBIToday's ABIToday's TBIPrevious ABIPrevious TBI +-------+-----------+-----------+------------+------------+ Right  >1.0 [place]    .44                                 +-------+-----------+-----------+------------+------------+ Left   .62        .43                                 +-------+-----------+-----------+------------+------------+  Summary: Right: Resting right ankle-brachial index indicates noncompressible right lower extremity arteries. The right toe-brachial index is abnormal. No Flow detected in the Right PTA. Left: Resting left ankle-brachial index indicates moderate left lower extremity arterial  disease. The left toe-brachial index is abnormal. No Flow detected in the Left PTA.  *See table(s) above for measurements and observations.  Electronically signed by Leotis Pain MD on 11/01/2020 at 3:01:00 PM.   Final        Assessment & Plan:   1. PAD (peripheral artery disease) ([place]) I had a long discussion with the patient and her daughter regarding her results.  The patient does have evidence of significant peripheral arterial disease bilaterally.  Right leg with the recent pain and known occlusion is more concerning than her left at this moment.  We discussed the risk and benefits of 2 different approaches including a watchful waiting approach versus angiogram.  As long as the patient is not experiencing severe rest pain like symptoms the wait and watch approach is acceptable however there is the possibility that it could become ischemic and it become a more emergent situation.  Whereas the angiogram is an invasive procedure it is done under conscious sedation so the risk is minimal.  Following discussion of the risk, benefits and alternatives the patient and her daughter wish to discuss and will contact her office with a decision.  If the patient chooses not to move forward with angiogram at this time we will have her return to the office in 3 months for evaluation.  2. Primary hypertension Continue antihypertensive medications as already ordered, these medications have been reviewed and there are no changes at this time.   3. Hyperlipidemia, unspecified hyperlipidemia type Continue statin as ordered and reviewed, no changes at this time   4. Bilateral leg edema The patient is advised to continue with medical grade 1 compression and elevation of her lower extremities.    Current Outpatient  Medications on File Prior to Visit  Medication Sig Dispense Refill  . albuterol (PROVENTIL HFA;VENTOLIN HFA) 108 (90 Base) MCG/ACT inhaler Inhale 1-2 puffs into the lungs every 4 (four) hours as needed  for wheezing.    Marland Kitchen aspirin EC 325 MG tablet Take by mouth.    Marland Kitchen azelastine (ASTELIN) 0.1 % nasal spray Place 2 sprays into both nostrils daily as needed for rhinitis or allergies.     . Calcium Carbonate-Vitamin D 600-400 MG-UNIT tablet Take by mouth.    . famotidine (PEPCID) 20 MG tablet Take 20 mg by mouth 2 (two) times daily.    Marland Kitchen FLOVENT HFA 44 MCG/ACT inhaler Inhale into the lungs.    Marland Kitchen FLUoxetine (PROZAC) 40 MG capsule Take 40 mg by mouth daily.    . hydrochlorothiazide (HYDRODIURIL) 25 MG tablet Take by mouth.    Marland Kitchen ipratropium (ATROVENT) 0.06 % nasal spray Place into the nose.    . levothyroxine (SYNTHROID, LEVOTHROID) 88 MCG tablet Take 88 mcg by mouth daily before breakfast.    . lisinopril (ZESTRIL) 5 MG tablet Take 5 mg by mouth daily.    Marland Kitchen loratadine (CLARITIN) 10 MG tablet Take 10 mg by mouth daily.    Marland Kitchen LORazepam (ATIVAN) 0.5 MG tablet Take 0.5 mg by mouth daily as needed.    . polyethylene glycol powder (GLYCOLAX/MIRALAX) powder Take 17 g by mouth daily.    . potassium chloride (K-DUR) 10 MEQ tablet Take 10 mEq by mouth daily.    . rosuvastatin (CRESTOR) 5 MG tablet Take 5 mg by mouth 3 (three) times a week.    Marland Kitchen amLODipine (NORVASC) 5 MG tablet Take 1 tablet (5 mg total) by mouth daily. (Patient not taking: No sig reported) 30 tablet 0  . atorvastatin (LIPITOR) 20 MG tablet Take 20 mg by mouth daily.    Marland Kitchen atorvastatin (LIPITOR) 40 MG tablet Take 1 tablet (40 mg total) by mouth daily at 6 PM. 90 tablet 3  . hydrochlorothiazide (HYDRODIURIL) 25 MG tablet Take 25 mg by mouth daily.    Marland Kitchen lisinopril (ZESTRIL) 10 MG tablet Take 20 mg by mouth daily.     . metoprolol succinate (TOPROL-XL) 25 MG 24 hr tablet Take 1 tablet by mouth daily.     No current facility-administered medications on file prior to visit.    There are no Patient Instructions on file for this visit. No follow-ups on file.   Kris Hartmann, NP

## 2020-11-04 NOTE — H&P (View-Only) (Signed)
Subjective:    Patient ID: Yvonne Parrish, female    DOB: 04-21-1929, 87 y.o.   MRN: [id number] Chief Complaint  Patient presents with  . Follow-up    Ultrasound follow up    Yvonne Parrish is an 87 year old female that presents today for evaluation of lower extremity swelling.  The patient was previously seen by this office many years ago.  In reviewing records, I am unable to find an office visit after 2013.  The patient previously had a left renal artery stent placed due to renal artery stenosis patient was also follow-up for a chronic type B thoracic aortic aneurysm.  At the most recent office visit the patient had severe pain in the right lower extremity.  There was an almost electrical stabbing sensation in her leg at times.  The patient has severe jerks.  The patient's daughter notes that she utilized a lidocaine patch and Tylenol and after that the pain went away.  The pain has not recurred since that day.  The patient also continues to have swelling of the lower extremities.  The patient denies any claudication-like symptoms however he does not ambulate frequently.  Noninvasive study showed no evidence of DVT or superficial venous thrombosis of the bilateral lower extremities.  No evidence of insufficiency superficial venous reflux seen bilaterally.  The patient's right ABI is noncompressible with a left of 0.62.  The patient's right TBI 0.44 with the left of 0.43.  There is no flow detected in the patient's bilateral posterior tibial arteries.  The patient has monophasic waveforms in her bilateral anterior tibial arteries although the left is more dampened.  The peroneal artery on the right is severely dampened.  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     Review of Systems  Cardiovascular: Positive for leg swelling.  Musculoskeletal: Positive for arthralgias and gait problem.  Neurological: Positive for weakness.  All other  systems reviewed and are negative.      Objective:   Physical Exam Vitals reviewed.  HENT:     Head: Normocephalic.  Cardiovascular:     Rate and Rhythm: Normal rate and regular rhythm.     Pulses:          Dorsalis pedis pulses are detected w/ Doppler on the right side and detected w/ Doppler on the left side.  Pulmonary:     Effort: Pulmonary effort is normal.     Breath sounds: Normal breath sounds.  Musculoskeletal:     Right lower leg: 1+ Edema present.     Left lower leg: 1+ Edema present.  Neurological:     Mental Status: She is alert and oriented to person, place, and time.     Motor: Weakness present.     Gait: Gait abnormal.  Psychiatric:        Mood and Affect: Mood normal.        Behavior: Behavior normal.        Thought Content: Thought content normal.        Judgment: Judgment normal.     BP (!) 148/71 (BP Location: Right Arm)   Pulse 60   Resp 16   Past Medical History:  Diagnosis Date  . Arthritis   . Asthma in child    question of interstitial lung disease  . CHF (congestive heart failure) ([place])   . Chronic back pain   . Chronic pain of right knee  for past 30 years  . Heart murmur   . Heartburn   . History of right foot drop   . Hypertension   . Hypothyroidism   . Myelodysplastic syndrome ([place])   . Stroke (cerebrum) ([place]) 08/2016    Social History   Socioeconomic History  . Marital status: Widowed    Spouse name: Not on file  . Number of children: Not on file  . Years of education: Not on file  . Highest education level: Not on file  Occupational History  . Not on file  Tobacco Use  . Smoking status: Never Smoker  . Smokeless tobacco: Never Used  Substance and Sexual Activity  . Alcohol use: No  . Drug use: No  . Sexual activity: Never  Other Topics Concern  . Not on file  Social History Narrative  . Not on file   Social Determinants of Health   Financial Resource Strain: Not on file  Food Insecurity: Not on file   Transportation Needs: Not on file  Physical Activity: Not on file  Stress: Not on file  Social Connections: Not on file  Intimate Partner Violence: Not on file    Past Surgical History:  Procedure Laterality Date  . CORONARY ANGIOPLASTY    . CORONARY ARTERY BYPASS GRAFT  1999  . KNEE ARTHROSCOPY    . knee fracture     Right knee pinning in '90's   . LUMBAR SPINE SURGERY    . TONSILLECTOMY      Family History  Problem Relation Age of Onset  . Stroke Mother   . Heart disease Father   . Kidney cancer Neg Hx   . Bladder Cancer Neg Hx   . Kidney disease Neg Hx     Allergies  Allergen Reactions  . Food [Wheat Bran] Shortness Of Breath and Itching    Sweet potatoes    CBC Latest Ref Rng & Units 09/05/2016 08/29/2016 08/26/2016  WBC 4.0 - 10.5 K/uL 7.0 10.2 9.7  Hemoglobin 12.0 - 15.0 g/dL 10.5(L) 11.7(L) 11.8(L)  Hematocrit 36.0 - 46.0 % 32.0(L) 35.7(L) 34.6(L)  Platelets 150 - 400 K/uL 252 298 254      CMP     Component Value Date/Time   NA 136 09/05/2016 0547   K 3.7 09/05/2016 0547   CL 104 09/05/2016 0547   CO2 24 09/05/2016 0547   GLUCOSE 116 (H) 09/05/2016 0547   BUN 11 01/14/2017 1110   CREATININE 0.87 01/14/2017 1110   CALCIUM 9.2 09/05/2016 0547   PROT 6.8 08/23/2016 0642   ALBUMIN 3.1 (L) 08/23/2016 0642   AST 19 08/23/2016 0642   ALT 10 (L) 08/23/2016 0642   ALKPHOS 76 08/23/2016 0642   BILITOT 0.9 08/23/2016 0642   GFRNONAA 61 01/14/2017 1110   GFRAA 70 01/14/2017 1110     VAS [place] ABI WITH/WO TBI  Result Date: 11/01/2020 LOWER EXTREMITY DOPPLER STUDY Indications: Rest pain.  Performing Technologist: Almira Coaster RVS  Examination Guidelines: A complete evaluation includes at minimum, Doppler waveform signals and systolic blood pressure reading at the level of bilateral brachial, anterior tibial, and posterior tibial arteries, when vessel segments are accessible. Bilateral testing is considered an integral part of a complete examination.  Photoelectric Plethysmograph (PPG) waveforms and toe systolic pressure readings are included as required and additional duplex testing as needed. Limited examinations for reoccurring indications may be performed as noted.  ABI Findings: +---------+------------------+-----+----------+--------+ Right    Rt Pressure (mmHg)IndexWaveform  Comment  +---------+------------------+-----+----------+--------+ Brachial 187                                       +---------+------------------+-----+----------+--------+  ATA      216               1.16 monophasicNC       +---------+------------------+-----+----------+--------+ Great Toe82                0.44 Abnormal           +---------+------------------+-----+----------+--------+ +---------+------------------+-----+----------+-------+ Left     Lt Pressure (mmHg)IndexWaveform  Comment +---------+------------------+-----+----------+-------+ Brachial 99                                       +---------+------------------+-----+----------+-------+ ATA      116               0.62 monophasic        +---------+------------------+-----+----------+-------+ Great Toe81                0.43 Abnormal          +---------+------------------+-----+----------+-------+ +-------+-----------+-----------+------------+------------+ ABI/TBIToday's ABIToday's TBIPrevious ABIPrevious TBI +-------+-----------+-----------+------------+------------+ Right  >1.0 [place]    .44                                 +-------+-----------+-----------+------------+------------+ Left   .62        .43                                 +-------+-----------+-----------+------------+------------+  Summary: Right: Resting right ankle-brachial index indicates noncompressible right lower extremity arteries. The right toe-brachial index is abnormal. No Flow detected in the Right PTA. Left: Resting left ankle-brachial index indicates moderate left lower extremity arterial  disease. The left toe-brachial index is abnormal. No Flow detected in the Left PTA.  *See table(s) above for measurements and observations.  Electronically signed by Leotis Pain MD on 11/01/2020 at 3:01:00 PM.   Final        Assessment & Plan:   1. PAD (peripheral artery disease) ([place]) I had a long discussion with the patient and her daughter regarding her results.  The patient does have evidence of significant peripheral arterial disease bilaterally.  Right leg with the recent pain and known occlusion is more concerning than her left at this moment.  We discussed the risk and benefits of 2 different approaches including a watchful waiting approach versus angiogram.  As long as the patient is not experiencing severe rest pain like symptoms the wait and watch approach is acceptable however there is the possibility that it could become ischemic and it become a more emergent situation.  Whereas the angiogram is an invasive procedure it is done under conscious sedation so the risk is minimal.  Following discussion of the risk, benefits and alternatives the patient and her daughter wish to discuss and will contact her office with a decision.  If the patient chooses not to move forward with angiogram at this time we will have her return to the office in 3 months for evaluation.  2. Primary hypertension Continue antihypertensive medications as already ordered, these medications have been reviewed and there are no changes at this time.   3. Hyperlipidemia, unspecified hyperlipidemia type Continue statin as ordered and reviewed, no changes at this time   4. Bilateral leg edema The patient is advised to continue with medical grade 1 compression and elevation of her lower extremities.    Current Outpatient  Medications on File Prior to Visit  Medication Sig Dispense Refill  . albuterol (PROVENTIL HFA;VENTOLIN HFA) 108 (90 Base) MCG/ACT inhaler Inhale 1-2 puffs into the lungs every 4 (four) hours as needed  for wheezing.    Marland Kitchen aspirin EC 325 MG tablet Take by mouth.    Marland Kitchen azelastine (ASTELIN) 0.1 % nasal spray Place 2 sprays into both nostrils daily as needed for rhinitis or allergies.     . Calcium Carbonate-Vitamin D 600-400 MG-UNIT tablet Take by mouth.    . famotidine (PEPCID) 20 MG tablet Take 20 mg by mouth 2 (two) times daily.    Marland Kitchen FLOVENT HFA 44 MCG/ACT inhaler Inhale into the lungs.    Marland Kitchen FLUoxetine (PROZAC) 40 MG capsule Take 40 mg by mouth daily.    . hydrochlorothiazide (HYDRODIURIL) 25 MG tablet Take by mouth.    Marland Kitchen ipratropium (ATROVENT) 0.06 % nasal spray Place into the nose.    . levothyroxine (SYNTHROID, LEVOTHROID) 88 MCG tablet Take 88 mcg by mouth daily before breakfast.    . lisinopril (ZESTRIL) 5 MG tablet Take 5 mg by mouth daily.    Marland Kitchen loratadine (CLARITIN) 10 MG tablet Take 10 mg by mouth daily.    Marland Kitchen LORazepam (ATIVAN) 0.5 MG tablet Take 0.5 mg by mouth daily as needed.    . polyethylene glycol powder (GLYCOLAX/MIRALAX) powder Take 17 g by mouth daily.    . potassium chloride (K-DUR) 10 MEQ tablet Take 10 mEq by mouth daily.    . rosuvastatin (CRESTOR) 5 MG tablet Take 5 mg by mouth 3 (three) times a week.    Marland Kitchen amLODipine (NORVASC) 5 MG tablet Take 1 tablet (5 mg total) by mouth daily. (Patient not taking: No sig reported) 30 tablet 0  . atorvastatin (LIPITOR) 20 MG tablet Take 20 mg by mouth daily.    Marland Kitchen atorvastatin (LIPITOR) 40 MG tablet Take 1 tablet (40 mg total) by mouth daily at 6 PM. 90 tablet 3  . hydrochlorothiazide (HYDRODIURIL) 25 MG tablet Take 25 mg by mouth daily.    Marland Kitchen lisinopril (ZESTRIL) 10 MG tablet Take 20 mg by mouth daily.     . metoprolol succinate (TOPROL-XL) 25 MG 24 hr tablet Take 1 tablet by mouth daily.     No current facility-administered medications on file prior to visit.    There are no Patient Instructions on file for this visit. No follow-ups on file.   Kris Hartmann, NP

## 2020-11-05 ENCOUNTER — Encounter (INDEPENDENT_AMBULATORY_CARE_PROVIDER_SITE_OTHER): Payer: Self-pay | Admitting: Nurse Practitioner

## 2020-11-08 ENCOUNTER — Telehealth (INDEPENDENT_AMBULATORY_CARE_PROVIDER_SITE_OTHER): Payer: Self-pay | Admitting: Nurse Practitioner

## 2020-11-08 NOTE — Telephone Encounter (Signed)
Yvonne Parrish called requesting getting an angiogram of the right leg for patient set up, she stated that the patient has agreed.  Neoma Laming also stated that patient has seen  Dr. Roque Lias and  he found bruit in left carotid artery is ordering an ultrasound will be done on April 5th, concerned about narrowing of artery.  Neoma Laming would like to see about setting up the angiogram.  Please advise.

## 2020-11-13 ENCOUNTER — Telehealth (INDEPENDENT_AMBULATORY_CARE_PROVIDER_SITE_OTHER): Payer: Self-pay

## 2020-11-13 NOTE — Telephone Encounter (Signed)
I attempted to contact the patient's daughter to schedule the patient for a right leg angio with Dr. Lucky Cowboy. A message was left for a return call.

## 2020-11-15 NOTE — Telephone Encounter (Signed)
Patient's daughter called back and the patient is scheduled with Dr. Lucky Cowboy for a right leg angio on 11/20/20 with a 10:45 am arrival time to the [facility]. Covid testing on 11/16/20 between 8-2 pm at the [facility]. Pre-procedure instructions were discussed and will be mailed.

## 2020-11-16 ENCOUNTER — Other Ambulatory Visit
Admission: RE | Admit: 2020-11-16 | Discharge: 2020-11-16 | Disposition: A | Discharge status: Home / Self Care | Payer: Medicare Other | Source: Ambulatory Visit | Attending: Vascular Surgery | Admitting: Vascular Surgery

## 2020-11-16 ENCOUNTER — Other Ambulatory Visit: Payer: Self-pay

## 2020-11-16 DIAGNOSIS — Z01812 Encounter for preprocedural laboratory examination: Secondary | ICD-10-CM | POA: Diagnosis present

## 2020-11-16 DIAGNOSIS — Z20822 Contact with and (suspected) exposure to covid-19: Secondary | ICD-10-CM | POA: Insufficient documentation

## 2020-11-17 LAB — SARS CORONAVIRUS 2 (TAT 6-24 HRS): SARS Coronavirus 2: NEGATIVE

## 2020-11-20 ENCOUNTER — Other Ambulatory Visit: Payer: Self-pay

## 2020-11-20 ENCOUNTER — Other Ambulatory Visit (INDEPENDENT_AMBULATORY_CARE_PROVIDER_SITE_OTHER): Payer: Self-pay | Admitting: Nurse Practitioner

## 2020-11-20 ENCOUNTER — Inpatient Hospital Stay
Admission: RE | Admit: 2020-11-20 | Discharge: 2020-11-27 | DRG: 253 | Disposition: A | Discharge status: Skilled Nursing Facility | Payer: Medicare Other | Source: Ambulatory Visit | Attending: Internal Medicine | Admitting: Internal Medicine

## 2020-11-20 ENCOUNTER — Encounter: Payer: Self-pay | Admitting: Vascular Surgery

## 2020-11-20 ENCOUNTER — Encounter
Admission: RE | Disposition: A | Discharge status: Skilled Nursing Facility | Payer: Self-pay | Source: Ambulatory Visit | Attending: Internal Medicine

## 2020-11-20 ENCOUNTER — Observation Stay: Payer: Medicare Other

## 2020-11-20 DIAGNOSIS — I5042 Chronic combined systolic (congestive) and diastolic (congestive) heart failure: Secondary | ICD-10-CM | POA: Diagnosis present

## 2020-11-20 DIAGNOSIS — I70229 Atherosclerosis of native arteries of extremities with rest pain, unspecified extremity: Secondary | ICD-10-CM

## 2020-11-20 DIAGNOSIS — I248 Other forms of acute ischemic heart disease: Secondary | ICD-10-CM | POA: Diagnosis present

## 2020-11-20 DIAGNOSIS — E7800 Pure hypercholesterolemia, unspecified: Secondary | ICD-10-CM | POA: Diagnosis present

## 2020-11-20 DIAGNOSIS — R5381 Other malaise: Secondary | ICD-10-CM | POA: Diagnosis present

## 2020-11-20 DIAGNOSIS — R0602 Shortness of breath: Secondary | ICD-10-CM

## 2020-11-20 DIAGNOSIS — D72829 Elevated white blood cell count, unspecified: Secondary | ICD-10-CM | POA: Diagnosis present

## 2020-11-20 DIAGNOSIS — Z79890 Hormone replacement therapy (postmenopausal): Secondary | ICD-10-CM

## 2020-11-20 DIAGNOSIS — Z951 Presence of aortocoronary bypass graft: Secondary | ICD-10-CM

## 2020-11-20 DIAGNOSIS — I2510 Atherosclerotic heart disease of native coronary artery without angina pectoris: Secondary | ICD-10-CM | POA: Diagnosis present

## 2020-11-20 DIAGNOSIS — I70201 Unspecified atherosclerosis of native arteries of extremities, right leg: Principal | ICD-10-CM | POA: Diagnosis present

## 2020-11-20 DIAGNOSIS — D509 Iron deficiency anemia, unspecified: Secondary | ICD-10-CM | POA: Diagnosis present

## 2020-11-20 DIAGNOSIS — Z20822 Contact with and (suspected) exposure to covid-19: Secondary | ICD-10-CM | POA: Diagnosis present

## 2020-11-20 DIAGNOSIS — Z823 Family history of stroke: Secondary | ICD-10-CM

## 2020-11-20 DIAGNOSIS — I739 Peripheral vascular disease, unspecified: Principal | ICD-10-CM | POA: Diagnosis present

## 2020-11-20 DIAGNOSIS — E039 Hypothyroidism, unspecified: Secondary | ICD-10-CM | POA: Diagnosis present

## 2020-11-20 DIAGNOSIS — F3340 Major depressive disorder, recurrent, in remission, unspecified: Secondary | ICD-10-CM | POA: Diagnosis present

## 2020-11-20 DIAGNOSIS — Z79899 Other long term (current) drug therapy: Secondary | ICD-10-CM

## 2020-11-20 DIAGNOSIS — I130 Hypertensive heart and chronic kidney disease with heart failure and stage 1 through stage 4 chronic kidney disease, or unspecified chronic kidney disease: Secondary | ICD-10-CM | POA: Diagnosis present

## 2020-11-20 DIAGNOSIS — I70221 Atherosclerosis of native arteries of extremities with rest pain, right leg: Secondary | ICD-10-CM | POA: Diagnosis not present

## 2020-11-20 DIAGNOSIS — Z91018 Allergy to other foods: Secondary | ICD-10-CM

## 2020-11-20 DIAGNOSIS — Z7982 Long term (current) use of aspirin: Secondary | ICD-10-CM

## 2020-11-20 DIAGNOSIS — J45909 Unspecified asthma, uncomplicated: Secondary | ICD-10-CM | POA: Diagnosis present

## 2020-11-20 DIAGNOSIS — Z8673 Personal history of transient ischemic attack (TIA), and cerebral infarction without residual deficits: Secondary | ICD-10-CM

## 2020-11-20 DIAGNOSIS — N1831 Chronic kidney disease, stage 3a: Secondary | ICD-10-CM | POA: Diagnosis present

## 2020-11-20 DIAGNOSIS — I959 Hypotension, unspecified: Secondary | ICD-10-CM

## 2020-11-20 DIAGNOSIS — I10 Essential (primary) hypertension: Secondary | ICD-10-CM | POA: Diagnosis present

## 2020-11-20 DIAGNOSIS — Z8249 Family history of ischemic heart disease and other diseases of the circulatory system: Secondary | ICD-10-CM

## 2020-11-20 DIAGNOSIS — F0390 Unspecified dementia without behavioral disturbance: Secondary | ICD-10-CM | POA: Diagnosis present

## 2020-11-20 DIAGNOSIS — R079 Chest pain, unspecified: Secondary | ICD-10-CM

## 2020-11-20 DIAGNOSIS — Z9861 Coronary angioplasty status: Secondary | ICD-10-CM

## 2020-11-20 DIAGNOSIS — E876 Hypokalemia: Secondary | ICD-10-CM | POA: Diagnosis present

## 2020-11-20 DIAGNOSIS — Z66 Do not resuscitate: Secondary | ICD-10-CM | POA: Diagnosis present

## 2020-11-20 DIAGNOSIS — D469 Myelodysplastic syndrome, unspecified: Secondary | ICD-10-CM | POA: Diagnosis present

## 2020-11-20 DIAGNOSIS — I712 Thoracic aortic aneurysm, without rupture: Secondary | ICD-10-CM | POA: Diagnosis present

## 2020-11-20 HISTORY — PX: LOWER EXTREMITY ANGIOGRAPHY: CATH118251

## 2020-11-20 LAB — CBC
HCT: 30.9 % — ABNORMAL LOW (ref 36.0–46.0)
Hemoglobin: 10.4 g/dL — ABNORMAL LOW (ref 12.0–15.0)
MCH: 34.2 pg — ABNORMAL HIGH (ref 26.0–34.0)
MCHC: 33.7 g/dL (ref 30.0–36.0)
MCV: 101.6 fL — ABNORMAL HIGH (ref 80.0–100.0)
Platelets: 180 10*3/uL (ref 150–400)
RBC: 3.04 MIL/uL — ABNORMAL LOW (ref 3.87–5.11)
RDW: 11.7 % (ref 11.5–15.5)
WBC: 15.6 10*3/uL — ABNORMAL HIGH (ref 4.0–10.5)
nRBC: 0 % (ref 0.0–0.2)

## 2020-11-20 LAB — BASIC METABOLIC PANEL
Anion gap: 14 (ref 5–15)
BUN: 16 mg/dL (ref 8–23)
CO2: 24 mmol/L (ref 22–32)
Calcium: 9.2 mg/dL (ref 8.9–10.3)
Chloride: 98 mmol/L (ref 98–111)
Creatinine, Ser: 1.15 mg/dL — ABNORMAL HIGH (ref 0.44–1.00)
GFR, Estimated: 46 mL/min — ABNORMAL LOW (ref >60)
Glucose, Bld: 136 mg/dL — ABNORMAL HIGH (ref 70–99)
Potassium: 3.4 mmol/L — ABNORMAL LOW (ref 3.5–5.1)
Sodium: 136 mmol/L (ref 135–145)

## 2020-11-20 LAB — CREATININE, SERUM
Creatinine, Ser: 1.07 mg/dL — ABNORMAL HIGH (ref 0.44–1.00)
GFR, Estimated: 50 mL/min — ABNORMAL LOW (ref >60)

## 2020-11-20 LAB — TROPONIN I (HIGH SENSITIVITY)
Troponin I (High Sensitivity): 35 ng/L — ABNORMAL HIGH (ref <18)
Troponin I (High Sensitivity): 52 ng/L — ABNORMAL HIGH (ref <18)

## 2020-11-20 LAB — BUN: BUN: 16 mg/dL (ref 8–23)

## 2020-11-20 LAB — MAGNESIUM: Magnesium: 1.7 mg/dL (ref 1.7–2.4)

## 2020-11-20 SURGERY — LOWER EXTREMITY ANGIOGRAPHY
Anesthesia: Moderate Sedation | Site: Leg Lower | Laterality: Right

## 2020-11-20 MED ORDER — METHYLPREDNISOLONE SODIUM SUCC 125 MG IJ SOLR: 125.0000 mg | Freq: Once | INTRAMUSCULAR | Status: DC | PRN

## 2020-11-20 MED ORDER — ACETAMINOPHEN 325 MG PO TABS
650.0000 mg | ORAL_TABLET | ORAL | Status: DC | PRN
  Administered 2020-11-22 – 2020-11-27 (×9): 650 mg via ORAL
  Filled 2020-11-20 (×9): qty 2

## 2020-11-20 MED ORDER — ONDANSETRON HCL 4 MG/2ML IJ SOLN: 4.0000 mg | Freq: Four times a day (QID) | INTRAMUSCULAR | Status: DC | PRN

## 2020-11-20 MED ORDER — SODIUM CHLORIDE 0.9 % IV SOLN: INTRAVENOUS | Status: DC

## 2020-11-20 MED ORDER — MIDAZOLAM HCL 2 MG/2ML IJ SOLN
INTRAMUSCULAR | Status: AC
  Filled 2020-11-20: qty 2

## 2020-11-20 MED ORDER — SODIUM CHLORIDE 0.9 % IV SOLN: 250.0000 mL | INTRAVENOUS | Status: DC | PRN

## 2020-11-20 MED ORDER — HYDROMORPHONE HCL 1 MG/ML IJ SOLN: 1.0000 mg | Freq: Once | INTRAMUSCULAR | Status: DC | PRN

## 2020-11-20 MED ORDER — FENTANYL CITRATE (PF) 100 MCG/2ML IJ SOLN
INTRAMUSCULAR | Status: DC | PRN
  Administered 2020-11-20: 50 ug via INTRAVENOUS

## 2020-11-20 MED ORDER — DIPHENHYDRAMINE HCL 50 MG/ML IJ SOLN: 50.0000 mg | Freq: Once | INTRAMUSCULAR | Status: DC | PRN

## 2020-11-20 MED ORDER — ROSUVASTATIN CALCIUM 10 MG PO TABS
5.0000 mg | ORAL_TABLET | ORAL | Status: DC
  Administered 2020-11-22 – 2020-11-27 (×3): 5 mg via ORAL
  Filled 2020-11-20 (×3): qty 1

## 2020-11-20 MED ORDER — SODIUM CHLORIDE 0.9% FLUSH
3.0000 mL | Freq: Two times a day (BID) | INTRAVENOUS | Status: DC
  Administered 2020-11-21 – 2020-11-27 (×13): 3 mL via INTRAVENOUS

## 2020-11-20 MED ORDER — CEFAZOLIN SODIUM-DEXTROSE 2-4 GM/100ML-% IV SOLN
INTRAVENOUS | Status: AC
  Filled 2020-11-20: qty 100

## 2020-11-20 MED ORDER — CEFAZOLIN SODIUM-DEXTROSE 2-4 GM/100ML-% IV SOLN: 2.0000 g | Freq: Once | INTRAVENOUS | Status: DC

## 2020-11-20 MED ORDER — LEVOTHYROXINE SODIUM 88 MCG PO TABS
88.0000 ug | ORAL_TABLET | Freq: Every day | ORAL | Status: DC
  Administered 2020-11-21 – 2020-11-27 (×7): 88 ug via ORAL
  Filled 2020-11-20 (×8): qty 1

## 2020-11-20 MED ORDER — LABETALOL HCL 5 MG/ML IV SOLN: 10.0000 mg | INTRAVENOUS | Status: DC | PRN

## 2020-11-20 MED ORDER — MIDAZOLAM HCL 2 MG/2ML IJ SOLN
INTRAMUSCULAR | Status: DC | PRN
  Administered 2020-11-20: 1 mg via INTRAVENOUS

## 2020-11-20 MED ORDER — HEPARIN SODIUM (PORCINE) 1000 UNIT/ML IJ SOLN
INTRAMUSCULAR | Status: DC | PRN
  Administered 2020-11-20: 4000 [IU] via INTRAVENOUS

## 2020-11-20 MED ORDER — CLOPIDOGREL BISULFATE 75 MG PO TABS
75.0000 mg | ORAL_TABLET | Freq: Every day | ORAL | 11 refills | Status: DC
Start: 2020-11-20 — End: 2020-11-27

## 2020-11-20 MED ORDER — FENTANYL CITRATE (PF) 100 MCG/2ML IJ SOLN
INTRAMUSCULAR | Status: AC
  Filled 2020-11-20: qty 2

## 2020-11-20 MED ORDER — MIDAZOLAM HCL 2 MG/ML PO SYRP: 8.0000 mg | ORAL_SOLUTION | Freq: Once | ORAL | Status: DC | PRN

## 2020-11-20 MED ORDER — HEPARIN SODIUM (PORCINE) 1000 UNIT/ML IJ SOLN
INTRAMUSCULAR | Status: AC
  Filled 2020-11-20: qty 1

## 2020-11-20 MED ORDER — FAMOTIDINE 20 MG PO TABS: 40.0000 mg | ORAL_TABLET | Freq: Once | ORAL | Status: DC | PRN

## 2020-11-20 MED ORDER — FLUOXETINE HCL 20 MG PO CAPS
40.0000 mg | ORAL_CAPSULE | Freq: Every day | ORAL | Status: DC
  Administered 2020-11-21 – 2020-11-27 (×7): 40 mg via ORAL
  Filled 2020-11-20 (×7): qty 2

## 2020-11-20 MED ORDER — IODIXANOL 320 MG/ML IV SOLN
INTRAVENOUS | Status: DC | PRN
  Administered 2020-11-20: 75 mL via INTRA_ARTERIAL

## 2020-11-20 MED ORDER — HYDRALAZINE HCL 20 MG/ML IJ SOLN
5.0000 mg | INTRAMUSCULAR | Status: AC | PRN
  Administered 2020-11-25 (×2): 5 mg via INTRAVENOUS
  Filled 2020-11-20 (×2): qty 1

## 2020-11-20 MED ORDER — CLOPIDOGREL BISULFATE 75 MG PO TABS
75.0000 mg | ORAL_TABLET | Freq: Every day | ORAL | Status: DC
  Administered 2020-11-21 – 2020-11-27 (×7): 75 mg via ORAL
  Filled 2020-11-20 (×7): qty 1

## 2020-11-20 MED ORDER — SODIUM CHLORIDE 0.9 % IV SOLN: INTRAVENOUS | Status: AC

## 2020-11-20 MED ORDER — SODIUM CHLORIDE 0.9% FLUSH: 3.0000 mL | INTRAVENOUS | Status: DC | PRN

## 2020-11-20 SURGICAL SUPPLY — 29 items
BALLN DORADO 5X200X135 (BALLOONS) ×2
BALLN LUTONIX 018 4X300X130 (BALLOONS) ×2
BALLN LUTONIX 018 5X300X130 (BALLOONS) ×2
BALLN ULTRVRSE 2.5X300X150 (BALLOONS) ×2
BALLON DORADO 5X100X135 (BALLOONS) ×2
BALLOON DORADO 5X100X135 (BALLOONS) ×1 IMPLANT
BALLOON DORADO 5X200X135 (BALLOONS) ×1 IMPLANT
BALLOON LUTONIX 018 4X300X130 (BALLOONS) ×1 IMPLANT
BALLOON LUTONIX 018 5X300X130 (BALLOONS) ×1 IMPLANT
BALLOON ULTRVRSE 2.5X300X150 (BALLOONS) ×1 IMPLANT
CATH ANGIO 5F PIGTAIL 65CM (CATHETERS) ×2 IMPLANT
CATH BEACON 5 .038 100 VERT TP (CATHETERS) ×2 IMPLANT
CATH KUMPE SOFT-VU 5FR 65 (CATHETERS) ×2 IMPLANT
CATH NAVICROSS ANGLED 135CM (MICROCATHETER) ×2 IMPLANT
CATH TEMPO 5F RIM 65CM (CATHETERS) ×2 IMPLANT
COVER PROBE U/S 5X48 (MISCELLANEOUS) ×2 IMPLANT
DEVICE STARCLOSE SE CLOSURE (Vascular Products) ×2 IMPLANT
GLIDEWIRE ADV .035X260CM (WIRE) ×2 IMPLANT
KIT ENCORE 26 ADVANTAGE (KITS) ×2 IMPLANT
PACK ANGIOGRAPHY (CUSTOM PROCEDURE TRAY) ×2 IMPLANT
SHEATH ANL2 6FRX45 HC (SHEATH) ×2 IMPLANT
SHEATH BRITE TIP 5FRX11 (SHEATH) ×2 IMPLANT
STENT VIABAHN 6X150X120 (Permanent Stent) ×2 IMPLANT
STENT VIABAHN 6X250X120 (Permanent Stent) ×2 IMPLANT
STENT VIABAHN 6X50X120 (Permanent Stent) ×2 IMPLANT
SYR MEDRAD MARK 7 150ML (SYRINGE) ×2 IMPLANT
TUBING CONTRAST HIGH PRESS 72 (TUBING) ×2 IMPLANT
WIRE G V18X300CM (WIRE) ×2 IMPLANT
WIRE GUIDERIGHT .035X150 (WIRE) ×2 IMPLANT

## 2020-11-20 NOTE — OR Nursing (Signed)
PT with L arm pain, new symptom. New EKG obtained.

## 2020-11-20 NOTE — Interval H&P Note (Signed)
History and Physical Interval Note:  11/20/2020 11:31 AM  Yvonne Parrish  has presented today for surgery, with the diagnosis of RT LE Angio    BARD   ASO w rest pain Covid April 7.  The various methods of treatment have been discussed with the patient and family. After consideration of risks, benefits and other options for treatment, the patient has consented to  Procedure(s): LOWER EXTREMITY ANGIOGRAPHY (Right) as a surgical intervention.  The patient's history has been reviewed, patient examined, no change in status, stable for surgery.  I have reviewed the patient's chart and labs.  Questions were answered to the patient's satisfaction.     Leotis Pain

## 2020-11-20 NOTE — Discharge Instructions (Signed)

## 2020-11-20 NOTE — OR Nursing (Addendum)
MD made aware of increased trop and L arm pain. No new orders.

## 2020-11-20 NOTE — OR Nursing (Signed)
Admitting MD to bedside 

## 2020-11-20 NOTE — OR Nursing (Signed)
PT up to bedside commode to urinate before discharge. PT stated she felt like she was going to pass out and pt became pale and clammy. PT assisted back into bed by this RN and Lea RN. BP assessed on both arms d/t difference in readings, both stable. EKG obtained. Kim PA to bedside as well as Dr. Lucky Cowboy to assess pt. PT remained able to answer slightly threw event. PT c/o R leg cramping. Stick site is intact, no swelling. No pain in pt's belly, no chest pain. PT endorses [facility]

## 2020-11-20 NOTE — Op Note (Signed)
[facility]  Percutaneous Study/Intervention Procedural Note   Date of Surgery: 11/20/2020  Surgeon(s):Oluwadarasimi Redmon    Assistants:none  Pre-operative Diagnosis: PAD with rest pain right lower extremity  Post-operative diagnosis:  Same  Procedure(s) Performed:             1.  Ultrasound guidance for vascular access left femoral artery             2.  Catheter placement into right common femoral artery from left femoral approach             3.  Aortogram and selective right lower extremity angiogram             4.  Percutaneous transluminal angioplasty of right anterior tibial artery with 2.5 mm diameter by 30 cm length angioplasty balloon             5.   Percutaneous transluminal angioplasty of the right popliteal artery and SFA with 4 and 5 mm diameter Lutonix drug-coated angioplasty balloon  6.  Viabahn stent placement x3 to the right SFA and popliteal arteries with a 6 mm diameter by 25 cm length, 6 mm diameter by 15 cm length, and a 6 mm diameter by 5 cm length stent             7.  StarClose closure device left femoral artery  EBL: 20 cc  Contrast: 75 cc  Fluoro Time: 15.9 minutes  Moderate Conscious Sedation Time: approximately 84 minutes using 1 mg of Versed and 50 mcg of Fentanyl              Indications:  Patient is a 87 y.o.female with rest pain of the right foot. The patient has noninvasive study showing reduced monophasic flow bilaterally. The patient is brought in for angiography for further evaluation and potential treatment.  Due to the limb threatening nature of the situation, angiogram was performed for attempted limb salvage. The patient is aware that if the procedure fails, amputation would be expected.  The patient also understands that even with successful revascularization, amputation may still be required due to the severity of the situation.  Risks and benefits are discussed and informed consent is obtained.   Procedure:  The patient was  identified and appropriate procedural time out was performed.  The patient was then placed supine on the table and prepped and draped in the usual sterile fashion. Moderate conscious sedation was administered during a face to face encounter with the patient throughout the procedure with my supervision of the RN administering medicines and monitoring the patient's vital signs, pulse oximetry, telemetry and mental status throughout from the start of the procedure until the patient was taken to the recovery room. Ultrasound was used to evaluate the left common femoral artery.  It was patent but calcified and somewhat diseased.  A digital ultrasound image was acquired.  A Seldinger needle was used to access the left common femoral artery under direct ultrasound guidance and a permanent image was performed.  A 0.035 J wire was advanced without resistance and a 5Fr sheath was placed.  Pigtail catheter was placed into the aorta and an AP aortogram was performed. The aorta and iliac arteries were highly calcific but not stenotic.  The right renal artery appeared to have a high-grade stenosis.  The left renal artery stent appeared to have decent flow with some degree of mild restenosis but it was difficult to discern if this was hemodynamically significant. I then crossed the aortic  bifurcation and advanced to the right femoral head. Selective right lower extremity angiogram was then performed. This demonstrated a 60 to 70% stenosis in the mid to distal common femoral artery, a large profunda femoris artery feeding around a flush SFA occlusion with reconstitution of a diseased above-knee popliteal artery.  Distally, the tibial vessels were extremely hard to opacify and were better seen after we cross the occlusion.  The peroneal artery was small and had his typical course and not really feed the foot well.  The anterior tibial artery had a long segment occlusion but did reconstitute distally and help feed the foot.  The  posterior tibial artery was chronically occluded. It was felt that it was in the patient's best interest to proceed with intervention after these images to avoid a second procedure and a larger amount of contrast and fluoroscopy based off of the findings from the initial angiogram. The patient was systemically heparinized and a 6 [place] Ansell sheath was then placed over the [employer] wire. I then used a Kumpe catheter and the advantage wire to tediously get into the flush SFA occlusion then crossed the occlusion without difficulty with a Nava cross catheter and advantage wire.  I confirmed intraluminal flow in the distal popliteal artery.  I then was able to opacify the long segment anterior tibial artery occlusion, used a Nava cross catheter and first the advantage wire and then exchanged for a V 18 wire to cross this and parked the wire in the foot.  I then proceeded with treatment.  2.5 mm diameter by 30 cm angioplasty balloon was inflated from the distal anterior tibial artery up to the anterior tibial artery origin.  It was inflated to 8 atm for 1 minute.  This was then used to predilate the SFA and popliteal lesions.  These were then treated with a 4 mm diameter by 30 cm length Lutonix drug-coated angioplasty balloon from the distal popliteal artery up to the mid to distal SFA.  A 5 mm diameter by 30 cm length Lutonix drug-coated angioplasty balloons inflated from the common femoral artery to encompass the separate distinct common femoral lesion as well as the proximal and mid to mid to distal SFA.  This was inflated to 12 atm for 1 minute.  Completion imaging showed multiple segments greater than 50% stenosis throughout the SFA and popliteal arteries.  I elected to cover these with stents.  A 6 mm diameter by 25 cm length and then a 6 mm diameter by 15 cm length Viabahn stent were deployed from the origin of the SFA down to the above-knee popliteal artery.  There is still short segment of disease  below the stents that required treatment and so an additional 6 mm diameter by 5 cm length Viabahn stent was deployed.  These were postdilated with 5 mm diameter high-pressure angioplasty balloon with less than 10% residual stenosis throughout the SFA and popliteal arteries.  The anterior tibial artery appeared to have improved flow in the area of chronic occlusion was now open with less than 30% stenosis on there was still spasm distally and filling of the foot was sluggish. I elected to terminate the procedure. The sheath was removed and StarClose closure device was deployed in the left femoral artery with excellent hemostatic result. The patient was taken to the recovery room in stable condition having tolerated the procedure well.  Findings:               Aortogram:  The aorta and  iliac arteries were highly calcific but not stenotic.  The right renal artery appeared to have a high-grade stenosis.  The left renal artery stent appeared to have decent flow with some degree of mild restenosis but it was difficult to discern if this was hemodynamically significant.             Right lower Extremity:  This demonstrated a 60 to 70% stenosis in the mid to distal common femoral artery, a large profunda femoris artery feeding around a flush SFA occlusion with reconstitution of a diseased above-knee popliteal artery.  Distally, the tibial vessels were extremely hard to opacify and were better seen after we cross the occlusion.  The peroneal artery was small and had his typical course and not really feed the foot well.  The anterior tibial artery had a long segment occlusion but did reconstitute distally and help feed the foot.  The posterior tibial artery was chronically occluded.   Disposition: Patient was taken to the recovery room in stable condition having tolerated the procedure well.  Complications: None  Leotis Pain 11/20/2020 2:07 PM   This note was created with Dragon Medical transcription system. Any  errors in dictation are purely unintentional.

## 2020-11-20 NOTE — Consult Note (Signed)
Medical Consultation   Yvonne Parrish  OXB:[id number]  DOB: 12-May-1929  DOA: 11/20/2020  PCP: Tracie Harrier, MD  Requesting physician: Dr. Lucky Cowboy  Reason for consultation: Chest pain    History of Present Illness: Yvonne Parrish is an 87 y.o. female with past medical history significant for CHF, hypertension, hypothyroidism, MDS, peripheral artery disease who underwent angiogram of the right lower extremity with stent placement for diagnosis of peripheral artery disease.  She was monitored in the post op recovery area, was getting ready to be discharged home when she got to the bedside commode and felt clammy, lightheaded, dizzy, felt like she might pass out.  Daughter at bedside states that patient looked very pale, was hypotensive with SBP in the 80s.  She got back in bed, started complaining of shortness of breath as well as central chest pain that she described as a tightness.  [facility] hospitalist was consulted for chest pain.  On examination, patient states that she has shortness of breath on and off which is her baseline.  Chest pain however seems to be a new issue.  On my examination, she is chest pain-free.  Blood pressure has improved with SBP 110 during my examination.  Review of Systems:  As per HPI otherwise 10 point review of systems negative.    Review of Systems Past Medical History: Past Medical History:  Diagnosis Date  . Arthritis   . Asthma in child    question of interstitial lung disease  . CHF (congestive heart failure) ([place])   . Chronic back pain   . Chronic pain of right knee    for past 30 years  . Heart murmur   . Heartburn   . History of right foot drop   . Hypertension   . Hypothyroidism   . Myelodysplastic syndrome ([place])   . Stroke (cerebrum) ([place]) 08/2016    Past Surgical History: Past Surgical History:  Procedure Laterality Date  . CORONARY ANGIOPLASTY    . CORONARY ARTERY BYPASS GRAFT  1999  . KNEE ARTHROSCOPY    . knee  fracture     Right knee pinning in '90's   . LUMBAR SPINE SURGERY    . TONSILLECTOMY       Allergies:   Allergies  Allergen Reactions  . Food [Wheat Bran] Shortness Of Breath and Itching    Sweet potatoes and Wheat     Social History:  reports that she has never smoked. She has never used smokeless tobacco. She reports that she does not drink alcohol and does not use drugs.   Family History: Family History  Problem Relation Age of Onset  . Stroke Mother   . Heart disease Father   . Kidney cancer Neg Hx   . Bladder Cancer Neg Hx   . Kidney disease Neg Hx      Physical Exam: Vitals:   11/20/20 1641 11/20/20 1643 11/20/20 1700 11/20/20 1705  BP: (!) 128/102 (!) 146/69 97/85 (!) 85/57  Pulse:      Resp: (!) 22 (!) 24 (!) 21 19  Temp:      TempSrc:      SpO2:      Weight:      Height:        Constitutional: Alert and awake, oriented x3, not in any acute distress.  Pale Eyes: PERLA, EOMI, irises appear normal, anicteric sclera,  ENMT: external ears and nose appear normal,  normal hearing            Lips appears normal Neck: neck appears normal, no masses, normal ROM, no thyromegaly, no JVD  CVS: S1-S2 clear, no murmur rubs or gallops, no LE edema  Respiratory:  clear to auscultation bilaterally, no wheezing, rales or rhonchi. Respiratory effort normal. No accessory muscle use.  On room air Abdomen: soft nontender, nondistended, normal bowel sounds, no hepatosplenomegaly, no hernias  Musculoskeletal: : no cyanosis, clubbing or edema noted bilaterally Neuro: Cranial nerves II-XII intact, without any focal deficits Psych: judgement and insight appear normal, stable mood and affect, mental status, somewhat of a poor historian Skin: no rashes or lesions or ulcers on exposed skin  Data reviewed:  I have personally reviewed following labs and imaging studies Labs:  CBC: No results for input(s): WBC, NEUTROABS, HGB, HCT, MCV, PLT in the last 168 hours.  Basic Metabolic  Panel: Recent Labs  Lab 11/20/20 1213  BUN 16  CREATININE 1.07*   GFR Estimated Creatinine Clearance: 29.5 mL/min (A) (by C-G formula based on SCr of 1.07 mg/dL (H)). Liver Function Tests: No results for input(s): AST, ALT, ALKPHOS, BILITOT, PROT, ALBUMIN in the last 168 hours. No results for input(s): LIPASE, AMYLASE in the last 168 hours. No results for input(s): AMMONIA in the last 168 hours. Coagulation profile No results for input(s): INR, PROTIME in the last 168 hours.  Cardiac Enzymes: No results for input(s): CKTOTAL, CKMB, CKMBINDEX, TROPONINI in the last 168 hours. BNP: Invalid input(s): POCBNP CBG: No results for input(s): GLUCAP in the last 168 hours. D-Dimer No results for input(s): DDIMER in the last 72 hours. Hgb A1c No results for input(s): HGBA1C in the last 72 hours. Lipid Profile No results for input(s): CHOL, HDL, LDLCALC, TRIG, CHOLHDL, LDLDIRECT in the last 72 hours. Thyroid function studies No results for input(s): TSH, T4TOTAL, T3FREE, THYROIDAB in the last 72 hours.  Invalid input(s): FREET3 Anemia work up No results for input(s): VITAMINB12, FOLATE, FERRITIN, TIBC, IRON, RETICCTPCT in the last 72 hours. Urinalysis    Component Value Date/Time   COLORURINE YELLOW 08/20/2016 1422   APPEARANCEUR Clear 06/07/2020 1540   LABSPEC 1.028 08/20/2016 1422   PHURINE 7.0 08/20/2016 1422   GLUCOSEU Negative 06/07/2020 1540   HGBUR SMALL (A) 08/20/2016 1422   BILIRUBINUR Negative 06/07/2020 1540   KETONESUR NEGATIVE 08/20/2016 1422   PROTEINUR 1+ (A) 06/07/2020 1540   PROTEINUR NEGATIVE 08/20/2016 1422   NITRITE Negative 06/07/2020 1540   NITRITE NEGATIVE 08/20/2016 1422   LEUKOCYTESUR Negative 06/07/2020 1540     Sepsis Labs Invalid input(s): PROCALCITONIN,  WBC,  LACTICIDVEN Microbiology Recent Results (from the past 240 hour(s))  SARS CORONAVIRUS 2 (TAT 6-24 HRS) Nasopharyngeal Nasopharyngeal Swab     Status: None   Collection Time: 11/16/20  12:13 PM   Specimen: Nasopharyngeal Swab  Result Value Ref Range Status   SARS Coronavirus 2 NEGATIVE NEGATIVE Final    Comment: (NOTE) SARS-CoV-2 target nucleic acids are NOT DETECTED.  The SARS-CoV-2 RNA is generally detectable in upper and lower respiratory specimens during the acute phase of infection. Negative results do not preclude SARS-CoV-2 infection, do not rule out co-infections with other pathogens, and should not be used as the sole basis for treatment or other patient management decisions. Negative results must be combined with clinical observations, patient history, and epidemiological information. The expected result is Negative.  Fact Sheet for Patients: SugarRoll.be  Fact Sheet for Healthcare Providers: https://www.woods-mathews.com/  This test is not yet approved or  cleared by the [place] and  has been authorized for detection and/or diagnosis of SARS-CoV-2 by FDA under an Emergency Use Authorization (EUA). This EUA will remain  in effect (meaning this test can be used) for the duration of the COVID-19 declaration under Se ction 564(b)(1) of the Act, 21 U.S.C. section 360bbb-3(b)(1), unless the authorization is terminated or revoked sooner.  Performed at [facility], [place] [address] [place], [place] [address]        Inpatient Medications:   Scheduled Meds: . [START ON 11/21/2020] clopidogrel  75 mg Oral Q breakfast  . fentaNYL      . heparin sodium (porcine)      . midazolam      . sodium chloride flush  3 mL Intravenous Q12H   Continuous Infusions: . sodium chloride 75 mL/hr at 11/20/20 1215  . sodium chloride    . sodium chloride    . ceFAZolin    .  ceFAZolin (ANCEF) IV       Radiological Exams on Admission: PERIPHERAL VASCULAR CATHETERIZATION  Result Date: 11/20/2020 See op note   Impression/Recommendations Principal Problem:   PAD (peripheral artery disease) ([place]) Active  Problems:   Acquired hypothyroidism   Benign essential HTN   Major depressive disorder, recurrent, in remission ([place])   Pure hypercholesterolemia   Chest pain   Hypotension   Chest pain -EKG personally reviewed which sinus rhythm with left axis deviation, largely unchanged from previous -Observe on telemetry overnight -Check chest x-ray -Trend troponin -Echocardiogram last completed in 2018.  Update this admission  PAD status post angiography -Per primary service -Plavix, Crestor  Hypotension with pre-syncope  -Postop dropped down to 84/50.  Now stable and improved at 110/63 -Check labs including hemoglobin -Hold antihypertensive including HCTZ, lisinopril, Toprol  Hypothyroidism -Continue Synthroid  Thank you for this consultation.  Our [facility] hospitalist team will follow the patient with you.   Time Spent: 25 minutes   Dessa Phi D.O.  [facility] 11/20/2020, 5:42 PM

## 2020-11-20 NOTE — OR Nursing (Signed)
PT daughter [name] to take home wheelchair

## 2020-11-20 NOTE — OR Nursing (Signed)
MD made aware pt still can't catch her breath and briefly endorsed chest tightness. See new orders.  PT will be admitted. Family member at bedside and aware of plan

## 2020-11-21 ENCOUNTER — Encounter: Payer: Self-pay | Admitting: Vascular Surgery

## 2020-11-21 ENCOUNTER — Observation Stay
Admission: RE | Admit: 2020-11-21 | Discharge: 2020-11-21 | Disposition: A | Discharge status: Home / Self Care | Payer: Medicare Other | Source: Ambulatory Visit | Attending: Internal Medicine | Admitting: Internal Medicine

## 2020-11-21 ENCOUNTER — Other Ambulatory Visit: Payer: Self-pay

## 2020-11-21 DIAGNOSIS — R0602 Shortness of breath: Secondary | ICD-10-CM

## 2020-11-21 DIAGNOSIS — I70229 Atherosclerosis of native arteries of extremities with rest pain, unspecified extremity: Secondary | ICD-10-CM | POA: Diagnosis not present

## 2020-11-21 DIAGNOSIS — I739 Peripheral vascular disease, unspecified: Secondary | ICD-10-CM | POA: Diagnosis not present

## 2020-11-21 LAB — CBC
HCT: 30.5 % — ABNORMAL LOW (ref 36.0–46.0)
Hemoglobin: 10.5 g/dL — ABNORMAL LOW (ref 12.0–15.0)
MCH: 34.3 pg — ABNORMAL HIGH (ref 26.0–34.0)
MCHC: 34.4 g/dL (ref 30.0–36.0)
MCV: 99.7 fL (ref 80.0–100.0)
Platelets: 165 10*3/uL (ref 150–400)
RBC: 3.06 MIL/uL — ABNORMAL LOW (ref 3.87–5.11)
RDW: 11.9 % (ref 11.5–15.5)
WBC: 13.8 10*3/uL — ABNORMAL HIGH (ref 4.0–10.5)
nRBC: 0 % (ref 0.0–0.2)

## 2020-11-21 LAB — BASIC METABOLIC PANEL
Anion gap: 14 (ref 5–15)
BUN: 17 mg/dL (ref 8–23)
CO2: 23 mmol/L (ref 22–32)
Calcium: 9 mg/dL (ref 8.9–10.3)
Chloride: 99 mmol/L (ref 98–111)
Creatinine, Ser: 0.95 mg/dL (ref 0.44–1.00)
GFR, Estimated: 57 mL/min — ABNORMAL LOW (ref >60)
Glucose, Bld: 129 mg/dL — ABNORMAL HIGH (ref 70–99)
Potassium: 3.3 mmol/L — ABNORMAL LOW (ref 3.5–5.1)
Sodium: 136 mmol/L (ref 135–145)

## 2020-11-21 LAB — TROPONIN I (HIGH SENSITIVITY)
Troponin I (High Sensitivity): 255 ng/L (ref <18)
Troponin I (High Sensitivity): 204 ng/L (ref <18)

## 2020-11-21 MED ORDER — POTASSIUM CHLORIDE CRYS ER 20 MEQ PO TBCR
40.0000 meq | EXTENDED_RELEASE_TABLET | Freq: Once | ORAL | Status: AC
  Administered 2020-11-21: 40 meq via ORAL
  Filled 2020-11-21: qty 2

## 2020-11-21 MED ORDER — METOPROLOL TARTRATE 25 MG PO TABS
12.5000 mg | ORAL_TABLET | Freq: Two times a day (BID) | ORAL | Status: DC
  Administered 2020-11-21 – 2020-11-27 (×13): 12.5 mg via ORAL
  Filled 2020-11-21 (×13): qty 1

## 2020-11-21 MED ORDER — ASPIRIN EC 81 MG PO TBEC
81.0000 mg | DELAYED_RELEASE_TABLET | Freq: Every day | ORAL | Status: DC
  Administered 2020-11-21 – 2020-11-27 (×7): 81 mg via ORAL
  Filled 2020-11-21 (×7): qty 1

## 2020-11-21 NOTE — Evaluation (Signed)
Physical Therapy Evaluation Patient Details Name: Yvonne Parrish MRN: [id number] DOB: August 12, 1929 Today's Date: 11/21/2020   History of Present Illness  Patient is 87 year old female admitted for angiography with stent of R LE. Was getting ready to discharge home and had syncope and chest pain. Therefore was admitted fo further work up.  S/P CABD, dyslipidemia, HTN  Clinical Impression  Patient received in bed, she is agreeable to PT session. Daughter present in the room. Patient reports no dizziness during session, but does have significant weakness. She performed bed mobility with mod independence, but was with significant effort. She requires min assist to stand and ambulate 18 or so feet. Patient will continue to benefit from skilled PT while here to improve strength, endurance and functional independence.      Follow Up Recommendations SNF;Supervision/Assistance - 24 hour    Equipment Recommendations  None recommended by PT    Recommendations for Other Services       Precautions / Restrictions Precautions Precautions: Fall Restrictions Weight Bearing Restrictions: No      Mobility  Bed Mobility Overal bed mobility: Modified Independent             General bed mobility comments: increased time and effort needed to perform supine to sit.    Transfers Overall transfer level: Needs assistance Equipment used: Rolling walker (2 wheeled) Transfers: Sit to/from Stand Sit to Stand: Min assist            Ambulation/Gait Ambulation/Gait assistance: Min guard Gait Distance (Feet): 18 Feet Assistive device: Rolling walker (2 wheeled) Gait Pattern/deviations: Step-through pattern;Decreased step length - right;Decreased step length - left;Steppage;Decreased dorsiflexion - right Gait velocity: decr   General Gait Details: Patient is steady with RW, demonstrates steppage gait on right due to foot drop. (Daughter reports she does not use AFO, she used to but now compensates.)  Fatigued quickly with ambulation requesting to turn around just after counter in room. Daughter also reports 2 falls in last 3 mo.  Stairs            Wheelchair Mobility    Modified Rankin (Stroke Patients Only)       Balance Overall balance assessment: Needs assistance Sitting-balance support: Feet supported Sitting balance-Leahy Scale: Good     Standing balance support: Bilateral upper extremity supported;During functional activity Standing balance-Leahy Scale: Fair Standing balance comment: reliant on RW and min guard, weakness creates increased fall risk.                             Pertinent Vitals/Pain Pain Assessment: No/denies pain    Home Living Family/patient expects to be discharged to:: Skilled nursing facility Living Arrangements: Alone Available Help at Discharge: Family;Available PRN/intermittently Type of Home: House Home Access: Stairs to enter   Entrance Stairs-Number of Steps: 2 Home Layout: One level Home Equipment: Walker - 4 wheels;Wheelchair - manual      Prior Function Level of Independence: Independent with assistive device(s)         Comments: Patient reports she does not walk far at baseline. Household distances with rollator, wheelchair used for longer distances.     Hand Dominance   Dominant Hand: Right    Extremity/Trunk Assessment   Upper Extremity Assessment Upper Extremity Assessment: Generalized weakness    Lower Extremity Assessment Lower Extremity Assessment: Generalized weakness    Cervical / Trunk Assessment Cervical / Trunk Assessment: Kyphotic  Communication   Communication: Expressive difficulties  Cognition  Arousal/Alertness: Awake/alert Behavior During Therapy: WFL for tasks assessed/performed Overall Cognitive Status: History of cognitive impairments - at baseline                                        General Comments      Exercises     Assessment/Plan    PT  Assessment Patient needs continued PT services  PT Problem List Decreased strength;Decreased activity tolerance;Decreased balance;Decreased mobility;Decreased cognition;Decreased safety awareness       PT Treatment Interventions Therapeutic exercise;Gait training;Balance training;Stair training;Neuromuscular re-education;Functional mobility training;Therapeutic activities;Patient/family education    PT Goals (Current goals can be found in the Care Plan section)  Acute Rehab PT Goals Patient Stated Goal: to return home, improve strength PT Goal Formulation: With patient/family Time For Goal Achievement: 12/05/20 Potential to Achieve Goals: Fair    Frequency Min 2X/week   Barriers to discharge Decreased caregiver support;Inaccessible home environment      Co-evaluation               AM-PAC PT "6 Clicks" Mobility  Outcome Measure Help needed turning from your back to your side while in a flat bed without using bedrails?: A Little Help needed moving from lying on your back to sitting on the side of a flat bed without using bedrails?: A Little Help needed moving to and from a bed to a chair (including a wheelchair)?: A Little Help needed standing up from a chair using your arms (e.g., wheelchair or bedside chair)?: A Little Help needed to walk in hospital room?: A Lot Help needed climbing 3-5 steps with a railing? : A Lot 6 Click Score: 16    End of Session Equipment Utilized During Treatment: Gait belt Activity Tolerance: Patient limited by fatigue Patient left: in bed;Other (comment) (left sitting on side of bed with OT present in room.) Nurse Communication: Mobility status PT Visit Diagnosis: Other abnormalities of gait and mobility (R26.89);Muscle weakness (generalized) (M62.81);History of falling (Z91.81);Difficulty in walking, not elsewhere classified (R26.2)    Time: 1517-6160 PT Time Calculation (min) (ACUTE ONLY): 23 min   Charges:   PT Evaluation $PT Eval  Moderate Complexity: 1 Mod PT Treatments $Gait Training: 8-22 mins        Janeshia Ciliberto, PT, GCS 11/21/20,4:17 PM

## 2020-11-21 NOTE — Progress Notes (Signed)
PROGRESS NOTE    Yvonne Parrish  YBO:[id number] DOB: Dec 13, 1928 DOA: 11/20/2020 PCP: Tracie Harrier, MD     Brief Narrative:  Yvonne Parrish is an 87 y.o. female with past medical history significant for CHF, hypertension, hypothyroidism, MDS, peripheral artery disease who underwent angiogram of the right lower extremity with stent placement for diagnosis of peripheral artery disease.  She was monitored in the post op recovery area, was getting ready to be discharged home when she got to the bedside commode and felt clammy, lightheaded, dizzy, felt like she might pass out.  Daughter at bedside states that patient looked very pale, was hypotensive with SBP in the 80s.  She got back in bed, started complaining of shortness of breath as well as central chest pain that she described as a tightness.  [facility] hospitalist was consulted for chest pain.    New events last 24 hours / Subjective: Patient with some underlying history of memory loss, delirium that has slowly worsened in the past several months. She talks about her grandchildren who came to visit (they had not as they are currently out of town).  Per daughter at bedside, patient has episodes of confusion especially at nighttime.  Currently, patient lives at home alone, able to ambulate around the house using a walker but does not do much housework, does not drive.   This morning, patient is pleasant, denies any chest pain.     Assessment & Plan:   Principal Problem:   PAD (peripheral artery disease) ([place]) Active Problems:   Acquired hypothyroidism   Benign essential HTN   Major depressive disorder, recurrent, in remission ([place])   Pure hypercholesterolemia   Chest pain   Hypotension   Chest pain CAD s/p CABG in 1999 -EKG personally reviewed which sinus rhythm with left axis deviation, largely unchanged from previous -Chest x-ray without active cardiopulmonary disease  -Trend troponin 35 --> 52 --> 255 --> 204  -Echocardiogram last  completed in 2018. Repeat pending  -Cardiology consulted  -Aspirin, plavix, crestor  PAD status post angiography -Per primary service -Aspirin, plavix, crestor  Hypotension with pre-syncope  -Postop dropped down to 84/50.  Now stable and improved at 110/63 -Resolved, resume lopressor today   Hypothyroidism -Continue Synthroid  Hypokalemia -Replace, trend  Leukocytosis -Likely in setting of stress response, trending downward  CKD stage IIIa -Stable  Chronic normocytic anemia -Stable   Code Status: DNR Family Communication: Daughter at bedside   Antimicrobials:  Anti-infectives (From admission, onward)   Start     Dose/Rate Route Frequency Ordered Stop   11/20/20 1121  ceFAZolin (ANCEF) 2-4 GM/100ML-% IVPB       Note to Pharmacy: Maynor, Erin   : cabinet override      11/20/20 1121 11/20/20 2329   11/20/20 0200  ceFAZolin (ANCEF) IVPB 2g/100 mL premix  Status:  Discontinued        2 g 200 mL/hr over 30 Minutes Intravenous  Once 11/20/20 0158 11/20/20 1950        Objective: Vitals:   11/20/20 1900 11/20/20 2115 11/21/20 0400 11/21/20 0959  BP: (!) 144/73 (!) 143/77 (!) 175/69 (!) 160/69  Pulse: 81 86 75 75  Resp:      Temp:  98 F (36.7 C) (!) 97.5 F (36.4 C) 98.5 F (36.9 C)  TempSrc:  Oral Oral Oral  SpO2: 100% 98% 97% 94%  Weight:      Height:        Intake/Output Summary (Last 24 hours) at 11/21/2020  1150 Last data filed at 11/21/2020 0700 Gross per 24 hour  Intake 0 ml  Output 100 ml  Net -100 ml   Filed Weights   11/20/20 1139  Weight: 59.4 kg    Examination:  General exam: Appears calm and comfortable  Respiratory system: Clear to auscultation. Respiratory effort normal. No respiratory distress. No conversational dyspnea.  Cardiovascular system: S1 & S2 heard, RRR. No pedal edema. Gastrointestinal system: Abdomen is nondistended, soft and nontender. Normal bowel sounds heard. Central nervous system: Alert and oriented. No focal  neurological deficits. Speech clear.  Extremities: Symmetric in appearance  Skin: No rashes, lesions or ulcers on exposed skin  Psychiatry: Slightly impaired insight and judgement, likely underlying dementia, Mood & affect appropriate.   Data Reviewed: I have personally reviewed following labs and imaging studies  CBC: Recent Labs  Lab 11/20/20 1829 11/21/20 0625  WBC 15.6* 13.8*  HGB 10.4* 10.5*  HCT 30.9* 30.5*  MCV 101.6* 99.7  PLT 180 195   Basic Metabolic Panel: Recent Labs  Lab 11/20/20 1213 11/20/20 1829 11/21/20 0625  NA  --  136 136  K  --  3.4* 3.3*  CL  --  98 99  CO2  --  24 23  GLUCOSE  --  136* 129*  BUN 16 16 17   CREATININE 1.07* 1.15* 0.95  CALCIUM  --  9.2 9.0  MG  --  1.7  --    GFR: Estimated Creatinine Clearance: 33.2 mL/min (by C-G formula based on SCr of 0.95 mg/dL). Liver Function Tests: No results for input(s): AST, ALT, ALKPHOS, BILITOT, PROT, ALBUMIN in the last 168 hours. No results for input(s): LIPASE, AMYLASE in the last 168 hours. No results for input(s): AMMONIA in the last 168 hours. Coagulation Profile: No results for input(s): INR, PROTIME in the last 168 hours. Cardiac Enzymes: No results for input(s): CKTOTAL, CKMB, CKMBINDEX, TROPONINI in the last 168 hours. BNP (last 3 results) No results for input(s): PROBNP in the last 8760 hours. HbA1C: No results for input(s): HGBA1C in the last 72 hours. CBG: No results for input(s): GLUCAP in the last 168 hours. Lipid Profile: No results for input(s): CHOL, HDL, LDLCALC, TRIG, CHOLHDL, LDLDIRECT in the last 72 hours. Thyroid Function Tests: No results for input(s): TSH, T4TOTAL, FREET4, T3FREE, THYROIDAB in the last 72 hours. Anemia Panel: No results for input(s): VITAMINB12, FOLATE, FERRITIN, TIBC, IRON, RETICCTPCT in the last 72 hours. Sepsis Labs: No results for input(s): PROCALCITON, LATICACIDVEN in the last 168 hours.  Recent Results (from the past 240 hour(s))  SARS  CORONAVIRUS 2 (TAT 6-24 HRS) Nasopharyngeal Nasopharyngeal Swab     Status: None   Collection Time: 11/16/20 12:13 PM   Specimen: Nasopharyngeal Swab  Result Value Ref Range Status   SARS Coronavirus 2 NEGATIVE NEGATIVE Final    Comment: (NOTE) SARS-CoV-2 target nucleic acids are NOT DETECTED.  The SARS-CoV-2 RNA is generally detectable in upper and lower respiratory specimens during the acute phase of infection. Negative results do not preclude SARS-CoV-2 infection, do not rule out co-infections with other pathogens, and should not be used as the sole basis for treatment or other patient management decisions. Negative results must be combined with clinical observations, patient history, and epidemiological information. The expected result is Negative.  Fact Sheet for Patients: SugarRoll.be  Fact Sheet for Healthcare Providers: https://www.woods-mathews.com/  This test is not yet approved or cleared by the [place] FDA and  has been authorized for detection and/or diagnosis of SARS-CoV-2 by FDA  under an Emergency Use Authorization (EUA). This EUA will remain  in effect (meaning this test can be used) for the duration of the COVID-19 declaration under Se ction 564(b)(1) of the Act, 21 U.S.C. section 360bbb-3(b)(1), unless the authorization is terminated or revoked sooner.  Performed at [facility], [place] [address], [place], [place] [address]       Radiology Studies: DG Chest 2 View  Result Date: 11/20/2020 CLINICAL DATA:  Shortness of breath EXAM: CHEST - 2 VIEW COMPARISON:  12/31/2017 FINDINGS: Post sternotomy changes. Linear atelectasis or scar at the left base. No focal consolidation or effusion. Stable cardiomediastinal silhouette with aortic atherosclerosis. No pneumothorax. Possible calcified loose bodies at the right shoulder. IMPRESSION: No active cardiopulmonary disease. Linear atelectasis or scar at the left base.  Electronically Signed   By: Donavan Foil M.D.   On: 11/20/2020 19:01   PERIPHERAL VASCULAR CATHETERIZATION  Result Date: 11/20/2020 See op note     Scheduled Meds: . aspirin EC  81 mg Oral Daily  . clopidogrel  75 mg Oral Q breakfast  . FLUoxetine  40 mg Oral Daily  . levothyroxine  88 mcg Oral QAC breakfast  . metoprolol tartrate  12.5 mg Oral BID  . rosuvastatin  5 mg Oral Once per day on Mon Wed Fri  . sodium chloride flush  3 mL Intravenous Q12H   Continuous Infusions: . sodium chloride       LOS: 0 days      Time spent: 35 minutes   Dessa Phi, DO [facility] 11/21/2020, 11:50 AM   Available via Epic secure chat 7am-7pm After these hours, please refer to coverage provider listed on [email or web address]

## 2020-11-21 NOTE — Progress Notes (Signed)
[facility] Daily Progress Note  Subjective: (11/20/20)  1. Ultrasound guidance for vascular access left femoral artery 2. Catheter placement into right common femoral artery from left femoral approach 3. Aortogram and selective right lower extremity angiogram 4. Percutaneous transluminal angioplasty of right anterior tibial artery with 2.5 mm diameter by 30 cm length angioplasty balloon 5.  Percutaneous transluminal angioplasty of the right popliteal artery and SFA with 4 and 5 mm diameter Lutonix drug-coated angioplasty balloon             6.  Viabahn stent placement x3 to the right SFA and popliteal arteries with a 6 mm diameter by 25 cm length, 6 mm diameter by 15 cm length, and a 6 mm diameter by 5 cm length stent 7. StarClose closure device left femoral artery  Patient without complaints this AM.  Daughter at bedside.  Objective: Vitals:   11/20/20 1900 11/20/20 2115 11/21/20 0400 11/21/20 0959  BP: (!) 144/73 (!) 143/77 (!) 175/69 (!) 160/69  Pulse: 81 86 75 75  Resp:      Temp:  98 F (36.7 C) (!) 97.5 F (36.4 C) 98.5 F (36.9 C)  TempSrc:  Oral Oral Oral  SpO2: 100% 98% 97% 94%  Weight:      Height:        Intake/Output Summary (Last 24 hours) at 11/21/2020 1131 Last data filed at 11/21/2020 0700 Gross per 24 hour  Intake 0 ml  Output 100 ml  Net -100 ml   Physical Exam: A&Ox3, NAD CV: RRR Pulmonary: CTA Bilaterally Abdomen: Soft, Nontender, Nondistended Left groin: PAD intact.  No drainage, swelling or ecchymosis noted. Vascular:  Right lower extremity: Thigh soft.  Calf soft.  Extremity is warm distally toes.  Good capillary refill.  Motor/sensory is intact.   Laboratory: CBC    Component Value Date/Time   WBC 13.8 (H) 11/21/2020 0625   HGB 10.5 (L) 11/21/2020 0625   HCT 30.5 (L) 11/21/2020 0625   PLT 165 11/21/2020 0625   BMET    Component Value Date/Time    NA 136 11/21/2020 0625   K 3.3 (L) 11/21/2020 0625   CL 99 11/21/2020 0625   CO2 23 11/21/2020 0625   GLUCOSE 129 (H) 11/21/2020 0625   BUN 17 11/21/2020 0625   BUN 11 01/14/2017 1110   CREATININE 0.95 11/21/2020 0625   CALCIUM 9.0 11/21/2020 0625   GFRNONAA 57 (L) 11/21/2020 0625   GFRAA 70 01/14/2017 1110   Assessment/Planning: The patient is an 87 year old female who presented with atherosclerotic disease with rest pain to the right lower extremity - POD#1  1) okay to remove PAD 2) encouraged getting out of bed to chair and ambulation today 3) appreciate cardiology and hospitalist input 4) pending PT evaluation for dispo 5) medical management with aspirin, Plavix and statin 6) doing well from a vascular standpoint can be discharged home when medically stable.  Discussed with Dr. Ellis Parents Tykee Heideman PA-C 11/21/2020 11:31 AM

## 2020-11-21 NOTE — Evaluation (Signed)
Occupational Therapy Evaluation Patient Details Name: Yvonne Parrish MRN: [id number] DOB: August 26, 1928 Today's Date: 11/21/2020    History of Present Illness Patient is 87 year old female admitted for angiography with stent of R LE. Was getting ready to discharge home and had syncope and chest pain. Therefore was admitted fo further work up.  S/P CABD, dyslipidemia, HTN. PMH: stroke, CHF, hypertension, hypothyroidism, MDS, peripheral artery disease   Clinical Impression   Pt seen for OT evaluation on this date. Upon arrival to room, pt returning to bed following PT eval, in which she ambulated ~18 feet with RW. Pt reported feeling fatigued, however was agreeable to OT evaluation. Daughters present during evaluation. Prior to admission, pt was living alone in a 1-story home. Patient reports she does not walk far at baseline; pt walks household distances with rollator, uses wheelchair for longer distances, and receives "quite a bit of" assistance from family to enter home. Pt reports being MOD-I with ADLs (spongebathing at baseline) with increased time and seated rest breaks, and reports receiving assistance from family for IADLs. Pt and family endorse 2 falls within the past 3 months, with family reporting that pt has not been using her [place] following falls. This date, pt required MIN GUARD for LB dressing while seated EOB. Following LB dressing, pt attempted to participate in standing grooming tasks at sink-side, however appeared fatigued, resting b/l forearms on sink and requesting to sit (HR 80-90s throughout session). Pt currently presents with decreased strength and activity tolerance and would benefit from additional skilled OT services to maximize independence with ADLs and functional mobility and minimize risk of future falls, injury, caregiver burden, and readmission. Upon discharge recommend SNF.    Follow Up Recommendations  SNF    Equipment Recommendations  3 in 1 bedside  commode       Precautions / Restrictions Precautions Precautions: Fall Restrictions Weight Bearing Restrictions: No      Mobility Bed Mobility Overal bed mobility: Modified Independent             General bed mobility comments: increased time and effort needed to perform sit to supine    Transfers Overall transfer level: Needs assistance Equipment used: Rolling walker (2 wheeled) Transfers: Sit to/from Stand Sit to Stand: Min assist              Balance Overall balance assessment: Needs assistance Sitting-balance support: No upper extremity supported;Feet supported Sitting balance-Leahy Scale: Good Sitting balance - Comments: Good sitting balance while reaching beyond BOS to don socks and shoes   Standing balance support: Bilateral upper extremity supported;During functional activity Standing balance-Leahy Scale: Fair Standing balance comment: reliant on RW and min guard                           ADL either performed or assessed with clinical judgement   ADL Overall ADL's : Needs assistance/impaired                     Lower Body Dressing: Set up;Min guard;Sitting/lateral leans Lower Body Dressing Details (indicate cue type and reason): MIN GUARD to don/doff socks and don shoes. Required increased time/effort             Functional mobility during ADLs: Min guard;Rolling walker       Vision Baseline Vision/History: Wears glasses Wears Glasses: At all times              Pertinent Vitals/Pain Pain  Assessment: No/denies pain     Hand Dominance Right   Extremity/Trunk Assessment Upper Extremity Assessment Upper Extremity Assessment: Generalized weakness (At baseline, strength of R UE < L UE (since stroke in 2018), however grossly at least 3/5 in all movements)   Lower Extremity Assessment Lower Extremity Assessment: Generalized weakness   Cervical / Trunk Assessment Cervical / Trunk Assessment: Kyphotic   Communication  Communication Communication: Expressive difficulties   Cognition Arousal/Alertness: Awake/alert Behavior During Therapy: WFL for tasks assessed/performed Overall Cognitive Status: History of cognitive impairments - at baseline                                 General Comments: Pt pleasant and agreeable throughout session. Pt demonstrated decreased awareness of deficits and was reporting PLOF slightly different from information provided by daughter.      Exercises Other Exercises Other Exercises: Provided education on role of OT, POC, and discharge recommendations, with family verbalizing understanding of discharge recommendation however pt stating she wishes to go home        Home Living Family/patient expects to be discharged to:: Skilled nursing facility Living Arrangements: Alone Available Help at Discharge: Family;Available PRN/intermittently (Daughters live in [place]) Type of Home: House Home Access: Stairs to enter Technical brewer of Steps: 2   Home Layout: One level     Bathroom Shower/Tub: Walk-in Psychologist, prison and probation services: Handicapped height     Home Equipment: Environmental consultant - 4 wheels;Wheelchair - manual          Prior Functioning/Environment Level of Independence: Independent with assistive device(s)        Comments: Patient reports she does not walk far at baseline (household distances with rollator, wheelchair used for longer distances). Pt reports being independent with ADLs (spongebathing at basline) and reports receiving assistance from family for IADLs        OT Problem List: Decreased strength;Decreased activity tolerance;Impaired balance (sitting and/or standing);Decreased safety awareness      OT Treatment/Interventions: Self-care/ADL training;Therapeutic exercise;Energy conservation;DME and/or AE instruction;Therapeutic activities;Patient/family education    OT Goals(Current goals can be found in the care plan  section) Acute Rehab OT Goals Patient Stated Goal: to return home, improve strength ADL Goals Pt Will Perform Grooming: with modified independence;standing Pt Will Transfer to Toilet: with modified independence;ambulating;regular height toilet Pt Will Perform Toileting - Clothing Manipulation and hygiene: with modified independence;sitting/lateral leans  OT Frequency: Min 1X/week    AM-PAC OT "6 Clicks" Daily Activity     Outcome Measure Help from another person eating meals?: None Help from another person taking care of personal grooming?: A Little Help from another person toileting, which includes using toliet, bedpan, or urinal?: A Little Help from another person bathing (including washing, rinsing, drying)?: A Little Help from another person to put on and taking off regular upper body clothing?: A Little Help from another person to put on and taking off regular lower body clothing?: A Little 6 Click Score: 19   End of Session Equipment Utilized During Treatment: Gait belt;Rolling walker Nurse Communication: Mobility status  Activity Tolerance: Patient tolerated treatment well Patient left: in bed;with call bell/phone within reach;with bed alarm set;with family/visitor present  OT Visit Diagnosis: Unsteadiness on feet (R26.81);History of falling (Z91.81);Muscle weakness (generalized) (M62.81)                Time: 1536-1600 OT Time Calculation (min): 24 min Charges:  OT General Charges $OT Visit:  1 Visit OT Evaluation $OT Eval Moderate Complexity: [place], OTR/L [place]

## 2020-11-21 NOTE — Progress Notes (Signed)
Dr. Maylene Roes notified of critical value: troponin 255, per Dr. Maylene Roes will order cardio consult.

## 2020-11-21 NOTE — Plan of Care (Signed)
  Problem: Education: Goal: Knowledge of General Education information will improve Description: Including pain rating scale, medication(s)/side effects and non-pharmacologic comfort measures Outcome: Progressing   Problem: Health Behavior/Discharge Planning: Goal: Ability to manage health-related needs will improve Outcome: Progressing   Problem: Clinical Measurements: Goal: Will remain free from infection Outcome: Progressing   Problem: Elimination: Goal: Will not experience complications related to bowel motility Outcome: Progressing

## 2020-11-21 NOTE — Progress Notes (Signed)
*  PRELIMINARY RESULTS* Echocardiogram 2D Echocardiogram has been performed.  Yvonne Parrish [name] 11/21/2020, 2:18 PM

## 2020-11-21 NOTE — Plan of Care (Signed)

## 2020-11-21 NOTE — Consult Note (Signed)
CARDIOLOGY CONSULT NOTE               Patient ID: Yvonne Parrish MRN: [id number] DOB/AGE: 85/27/1930 85 y.o.  Admit date: 11/20/2020 Referring Physician Maylene Roes Primary Physician [facility] Primary Cardiologist Paraschos Reason for Consultation chest pain  HPI: 87 year old female referred for evaluation of chest pain. The patient has a history of coronary artery disease, status post CABG x3 in 1999, occluded SVG to PDA/RPL and SVG to OM 3 by cardiac catheterization in 2007, ischemic dilated cardiomyopathy, chronic systolic CHF with LVEF 46-27%, hypertension, hyperlipidemia, mitral and aortic regurgitation, chronic type B thoracic artery aneurysm, and CVA in 2018. The patient was admitted for scheduled lower extremity angiography of the right lower extremity for peripheral artery disease, status post successful stent placement x3 to the right SFA and popliteal arteries 11/20/2020. The patient reportedly was preparing to be discharged yesterday when she sat on the side of the bed, ambulated with assistance to the bedside commode, then complained of feeling dizzy with chest pain and shortness of breath, became clammy and pale, noted to be hypotensive with SBP in the 80s.  ECG revealed sinus rhythm with known LBBB at a rate of 78 bpm, overall unchanged from previous. High sensitivity troponin was mildly elevated with a delta (35-> 52-> 255). Her chest pain and blood pressure gradually improved and all BP medications have been held. Currently, the patient reports mild left sternal border chest pain without shortness of breath. She has no significant pain at this time. The patient's daughter states that the patient has had progressive memory decline in the last 8 weeks, but she denies any acute neurological deficits.    Review of systems complete and found to be negative unless listed above     Past Medical History:  Diagnosis Date  . Arthritis   . Asthma in child    question of interstitial lung  disease  . CHF (congestive heart failure) ([place])   . Chronic back pain   . Chronic pain of right knee    for past 30 years  . Heart murmur   . Heartburn   . History of right foot drop   . Hypertension   . Hypothyroidism   . Myelodysplastic syndrome ([place])   . Stroke (cerebrum) ([place]) 08/2016    Past Surgical History:  Procedure Laterality Date  . CORONARY ANGIOPLASTY    . CORONARY ARTERY BYPASS GRAFT  1999  . KNEE ARTHROSCOPY    . knee fracture     Right knee pinning in '90's   . LOWER EXTREMITY ANGIOGRAPHY Right 11/20/2020   Procedure: LOWER EXTREMITY ANGIOGRAPHY;  Surgeon: Algernon Huxley, MD;  Location: [facility];  Service: Cardiovascular;  Laterality: Right;  . LUMBAR SPINE SURGERY    . TONSILLECTOMY      Medications Prior to Admission  Medication Sig Dispense Refill Last Dose  . azelastine (ASTELIN) 0.1 % nasal spray Place 2 sprays into both nostrils daily as needed for rhinitis or allergies.    11/20/2020 at Unknown time  . Calcium Carbonate-Vitamin D 600-400 MG-UNIT tablet Take by mouth.   11/19/2020 at Unknown time  . famotidine (PEPCID) 20 MG tablet Take 20 mg by mouth 2 (two) times daily.   11/20/2020 at Unknown time  . FLOVENT HFA 44 MCG/ACT inhaler Inhale into the lungs.   11/20/2020 at Unknown time  . FLUoxetine (PROZAC) 40 MG capsule Take 40 mg by mouth daily.   11/20/2020 at Unknown time  . hydrochlorothiazide (HYDRODIURIL) 25  MG tablet Take 25 mg by mouth daily.   11/20/2020 at Unknown time  . ipratropium (ATROVENT) 0.06 % nasal spray Place into the nose.   11/20/2020 at Unknown time  . levothyroxine (SYNTHROID, LEVOTHROID) 88 MCG tablet Take 88 mcg by mouth daily before breakfast.   11/20/2020 at Unknown time  . lisinopril (ZESTRIL) 5 MG tablet Take 5 mg by mouth daily.   11/20/2020 at Unknown time  . loratadine (CLARITIN) 10 MG tablet Take 10 mg by mouth daily.   11/20/2020 at Unknown time  . metoprolol succinate (TOPROL-XL) 25 MG 24 hr tablet Take 1 tablet by mouth  daily.   11/20/2020 at Unknown time  . polyethylene glycol powder (GLYCOLAX/MIRALAX) powder Take 17 g by mouth daily.   11/20/2020 at Unknown time  . potassium chloride (K-DUR) 10 MEQ tablet Take 10 mEq by mouth daily.   11/20/2020 at Unknown time  . albuterol (PROVENTIL HFA;VENTOLIN HFA) 108 (90 Base) MCG/ACT inhaler Inhale 1-2 puffs into the lungs every 4 (four) hours as needed for wheezing.     . hydrochlorothiazide (HYDRODIURIL) 25 MG tablet Take by mouth.     Marland Kitchen LORazepam (ATIVAN) 0.5 MG tablet Take 0.5 mg by mouth daily as needed.     . rosuvastatin (CRESTOR) 5 MG tablet Take 5 mg by mouth 3 (three) times a week.   11/17/2020  . [DISCONTINUED] atorvastatin (LIPITOR) 20 MG tablet Take 20 mg by mouth daily. (Patient not taking: Reported on 11/20/2020)   Not Taking at Unknown time  . [DISCONTINUED] atorvastatin (LIPITOR) 40 MG tablet Take 1 tablet (40 mg total) by mouth daily at 6 PM. (Patient not taking: Reported on 11/20/2020) 90 tablet 3 Not Taking at Unknown time   Social History   Socioeconomic History  . Marital status: Widowed    Spouse name: Not on file  . Number of children: Not on file  . Years of education: Not on file  . Highest education level: Not on file  Occupational History  . Not on file  Tobacco Use  . Smoking status: Never Smoker  . Smokeless tobacco: Never Used  Substance and Sexual Activity  . Alcohol use: No  . Drug use: No  . Sexual activity: Never  Other Topics Concern  . Not on file  Social History Narrative  . Not on file   Social Determinants of Health   Financial Resource Strain: Not on file  Food Insecurity: Not on file  Transportation Needs: Not on file  Physical Activity: Not on file  Stress: Not on file  Social Connections: Not on file  Intimate Partner Violence: Not on file    Family History  Problem Relation Age of Onset  . Stroke Mother   . Heart disease Father   . Kidney cancer Neg Hx   . Bladder Cancer Neg Hx   . Kidney disease Neg Hx        Review of systems complete and found to be negative unless listed above      PHYSICAL EXAM  General: Well developed, well nourished, elderly frail female lying in bed in no acute distress HEENT:  Normocephalic and atramatic Neck:  No JVD.  Lungs: Clear bilaterally to auscultation, normal effort of breathing on room air Heart: HRRR . Normal S1 and S2 without gallops or murmurs.  Abdomen: Bowel sounds are positive, abdomen soft and non-tender  Msk:  Back normal, normal gait. Normal strength and tone for age. Extremities: No clubbing, cyanosis or edema.   Neuro: Alert  and oriented X 3. Psych:  Good affect, responds appropriately  Labs:   Lab Results  Component Value Date   WBC 13.8 (H) 11/21/2020   HGB 10.5 (L) 11/21/2020   HCT 30.5 (L) 11/21/2020   MCV 99.7 11/21/2020   PLT 165 11/21/2020    Recent Labs  Lab 11/21/20 0625  NA 136  K 3.3*  CL 99  CO2 23  BUN 17  CREATININE 0.95  CALCIUM 9.0  GLUCOSE 129*   Lab Results  Component Value Date   TROPONINI <0.03 08/20/2016    Lab Results  Component Value Date   CHOL 168 08/21/2016   Lab Results  Component Value Date   HDL 57 08/21/2016   Lab Results  Component Value Date   LDLCALC 94 08/21/2016   Lab Results  Component Value Date   TRIG 87 08/21/2016   Lab Results  Component Value Date   CHOLHDL 2.9 08/21/2016   No results found for: LDLDIRECT    Radiology: DG Chest 2 View  Result Date: 11/20/2020 CLINICAL DATA:  Shortness of breath EXAM: CHEST - 2 VIEW COMPARISON:  12/31/2017 FINDINGS: Post sternotomy changes. Linear atelectasis or scar at the left base. No focal consolidation or effusion. Stable cardiomediastinal silhouette with aortic atherosclerosis. No pneumothorax. Possible calcified loose bodies at the right shoulder. IMPRESSION: No active cardiopulmonary disease. Linear atelectasis or scar at the left base. Electronically Signed   By: Donavan Foil M.D.   On: 11/20/2020 19:01   PERIPHERAL  VASCULAR CATHETERIZATION  Result Date: 11/20/2020 See op note  VAS [place] ABI WITH/WO TBI  Result Date: 11/01/2020 LOWER EXTREMITY DOPPLER STUDY Indications: Rest pain.  Performing Technologist: Almira Coaster RVS  Examination Guidelines: A complete evaluation includes at minimum, Doppler waveform signals and systolic blood pressure reading at the level of bilateral brachial, anterior tibial, and posterior tibial arteries, when vessel segments are accessible. Bilateral testing is considered an integral part of a complete examination. Photoelectric Plethysmograph (PPG) waveforms and toe systolic pressure readings are included as required and additional duplex testing as needed. Limited examinations for reoccurring indications may be performed as noted.  ABI Findings: +---------+------------------+-----+----------+--------+ Right    Rt Pressure (mmHg)IndexWaveform  Comment  +---------+------------------+-----+----------+--------+ Brachial 187                                       +---------+------------------+-----+----------+--------+ ATA      216               1.16 monophasicNC       +---------+------------------+-----+----------+--------+ Great Toe82                0.44 Abnormal           +---------+------------------+-----+----------+--------+ +---------+------------------+-----+----------+-------+ Left     Lt Pressure (mmHg)IndexWaveform  Comment +---------+------------------+-----+----------+-------+ Brachial 99                                       +---------+------------------+-----+----------+-------+ ATA      116               0.62 monophasic        +---------+------------------+-----+----------+-------+ Great Toe81                0.43 Abnormal          +---------+------------------+-----+----------+-------+ +-------+-----------+-----------+------------+------------+ ABI/TBIToday's ABIToday's TBIPrevious ABIPrevious TBI  +-------+-----------+-----------+------------+------------+  Right  >1.0 [place]    .44                                 +-------+-----------+-----------+------------+------------+ Left   .62        .43                                 +-------+-----------+-----------+------------+------------+  Summary: Right: Resting right ankle-brachial index indicates noncompressible right lower extremity arteries. The right toe-brachial index is abnormal. No Flow detected in the Right PTA. Left: Resting left ankle-brachial index indicates moderate left lower extremity arterial disease. The left toe-brachial index is abnormal. No Flow detected in the Left PTA.  *See table(s) above for measurements and observations.  Electronically signed by Leotis Pain MD on 11/01/2020 at 3:01:00 PM.   Final    VAS [place] LOWER EXTREMITY VENOUS REFLUX  Result Date: 11/01/2020  Lower Venous Reflux Study Indications: Edema.  Performing Technologist: Almira Coaster RVS  Examination Guidelines: A complete evaluation includes B-mode imaging, spectral Doppler, color Doppler, and power Doppler as needed of all accessible portions of each vessel. Bilateral testing is considered an integral part of a complete examination. Limited examinations for reoccurring indications may be performed as noted. The reflux portion of the exam is performed with the patient in reverse Trendelenburg. Significant venous reflux is defined as >500 ms in the superficial venous system, and >1 second in the deep venous system.  Venous Reflux Times +--------------+---------+------+-----------+------------+--------+ RIGHT         Reflux NoRefluxReflux TimeDiameter cmsComments                         Yes                                  +--------------+---------+------+-----------+------------+--------+ CFV           no                                             +--------------+---------+------+-----------+------------+--------+ FV prox       no                                              +--------------+---------+------+-----------+------------+--------+ FV mid        no                                             +--------------+---------+------+-----------+------------+--------+ FV dist       no                                             +--------------+---------+------+-----------+------------+--------+ Popliteal     no                                             +--------------+---------+------+-----------+------------+--------+  GSV at [facility]    no                            .49              +--------------+---------+------+-----------+------------+--------+ GSV prox thighno                            .37              +--------------+---------+------+-----------+------------+--------+ GSV mid thigh no                            .26              +--------------+---------+------+-----------+------------+--------+ GSV dist thighno                            .19              +--------------+---------+------+-----------+------------+--------+ GSV at knee   no                            .24              +--------------+---------+------+-----------+------------+--------+ GSV prox calf no                            .16              +--------------+---------+------+-----------+------------+--------+ SSV Pop Fossa no                            .21              +--------------+---------+------+-----------+------------+--------+  +--------------+---------+------+-----------+------------+--------+ LEFT          Reflux NoRefluxReflux TimeDiameter cmsComments                         Yes                                  +--------------+---------+------+-----------+------------+--------+ CFV           no                                             +--------------+---------+------+-----------+------------+--------+ FV prox       no                                              +--------------+---------+------+-----------+------------+--------+ FV mid        no                                             +--------------+---------+------+-----------+------------+--------+ FV dist       no                                             +--------------+---------+------+-----------+------------+--------+  Popliteal     no                                             +--------------+---------+------+-----------+------------+--------+ GSV at SFJ    no                            .34              +--------------+---------+------+-----------+------------+--------+ GSV prox thighno                            .48              +--------------+---------+------+-----------+------------+--------+ GSV mid thigh no                            .37              +--------------+---------+------+-----------+------------+--------+ GSV dist thighno                            .37              +--------------+---------+------+-----------+------------+--------+ GSV at knee   no                            .34              +--------------+---------+------+-----------+------------+--------+ GSV prox calf no                            .31              +--------------+---------+------+-----------+------------+--------+ SSV Pop Fossa no                            .21              +--------------+---------+------+-----------+------------+--------+   Summary: Bilateral: - No evidence of deep vein thrombosis seen in the lower extremities, bilaterally, from the common femoral through the popliteal veins.  - No evidence of superficial venous thrombosis in the lower extremities, bilaterally.  - No evidence of deep venous insufficiency seen bilaterally in the lower extremity.  - No evidence of superficial venous reflux seen in the greater saphenous veins bilaterally.  - No evidence of superficial venous reflux seen in the short saphenous veins bilaterally.  Right:  - Incidental Findings: The Right SFA appears to be Occluded in the Proximal and Mid segment.  *See table(s) above for measurements and observations. Electronically signed by Leotis Pain MD on 11/01/2020 at 3:01:04 PM.    Final     EKG: sinus rhythm, chronic LBBB  ASSESSMENT AND PLAN:  1. Chest pain, with high sensitivity troponin mildly elevated with a delta (35-> 52-> 255) with known coronary artery disease, status post lower extremity angiography and stent placement 11/20/2020. ECG revealed sinus rhythm with chronic LBBB. 2. Hypotension and near syncope, resolved, now hypertensive 3. Peripheral artery disease, status post right lower extremity angiogram and stent placement x 3 on 11/20/2020 4. Coronary artery disease, status post CABG x3 in 1999, occluded SVG to PDA/RPL and SVG to OM 3 by cardiac catheterization in 2007 5.  Chronic systolic CHF with LVEF 91-44%  Recommendations: 1. Continue to cycle troponin 2. Recommend metoprolol tartrate 12.5 mg BID as blood pressure is now elevated and continue monitoring BP closely; uptitrate as needed 3. 2D echocardiogram to assess for wall motion abnormalities 4. Continue Plavix and statin 5. Add aspirin 81 mg daily 6. Further recommendations pending patient initial course  Signed: Sharolyn Douglas 11/21/2020, 10:26 AM

## 2020-11-22 DIAGNOSIS — I712 Thoracic aortic aneurysm, without rupture: Secondary | ICD-10-CM | POA: Diagnosis present

## 2020-11-22 DIAGNOSIS — Z79899 Other long term (current) drug therapy: Secondary | ICD-10-CM | POA: Diagnosis not present

## 2020-11-22 DIAGNOSIS — R0602 Shortness of breath: Secondary | ICD-10-CM | POA: Diagnosis not present

## 2020-11-22 DIAGNOSIS — I248 Other forms of acute ischemic heart disease: Secondary | ICD-10-CM | POA: Diagnosis present

## 2020-11-22 DIAGNOSIS — Z7982 Long term (current) use of aspirin: Secondary | ICD-10-CM | POA: Diagnosis not present

## 2020-11-22 DIAGNOSIS — R079 Chest pain, unspecified: Secondary | ICD-10-CM | POA: Diagnosis not present

## 2020-11-22 DIAGNOSIS — Z20822 Contact with and (suspected) exposure to covid-19: Secondary | ICD-10-CM | POA: Diagnosis present

## 2020-11-22 DIAGNOSIS — I9581 Postprocedural hypotension: Secondary | ICD-10-CM | POA: Diagnosis not present

## 2020-11-22 DIAGNOSIS — Z66 Do not resuscitate: Secondary | ICD-10-CM | POA: Diagnosis present

## 2020-11-22 DIAGNOSIS — I5042 Chronic combined systolic (congestive) and diastolic (congestive) heart failure: Secondary | ICD-10-CM | POA: Diagnosis present

## 2020-11-22 DIAGNOSIS — I739 Peripheral vascular disease, unspecified: Secondary | ICD-10-CM | POA: Diagnosis not present

## 2020-11-22 DIAGNOSIS — E7800 Pure hypercholesterolemia, unspecified: Secondary | ICD-10-CM | POA: Diagnosis present

## 2020-11-22 DIAGNOSIS — D469 Myelodysplastic syndrome, unspecified: Secondary | ICD-10-CM | POA: Diagnosis present

## 2020-11-22 DIAGNOSIS — Z8249 Family history of ischemic heart disease and other diseases of the circulatory system: Secondary | ICD-10-CM | POA: Diagnosis not present

## 2020-11-22 DIAGNOSIS — E039 Hypothyroidism, unspecified: Secondary | ICD-10-CM | POA: Diagnosis present

## 2020-11-22 DIAGNOSIS — Z8673 Personal history of transient ischemic attack (TIA), and cerebral infarction without residual deficits: Secondary | ICD-10-CM | POA: Diagnosis not present

## 2020-11-22 DIAGNOSIS — I10 Essential (primary) hypertension: Secondary | ICD-10-CM | POA: Diagnosis not present

## 2020-11-22 DIAGNOSIS — F3340 Major depressive disorder, recurrent, in remission, unspecified: Secondary | ICD-10-CM | POA: Diagnosis present

## 2020-11-22 DIAGNOSIS — I70201 Unspecified atherosclerosis of native arteries of extremities, right leg: Secondary | ICD-10-CM | POA: Diagnosis present

## 2020-11-22 DIAGNOSIS — D72829 Elevated white blood cell count, unspecified: Secondary | ICD-10-CM | POA: Diagnosis present

## 2020-11-22 DIAGNOSIS — I959 Hypotension, unspecified: Secondary | ICD-10-CM | POA: Diagnosis present

## 2020-11-22 DIAGNOSIS — Z91018 Allergy to other foods: Secondary | ICD-10-CM | POA: Diagnosis not present

## 2020-11-22 DIAGNOSIS — I70229 Atherosclerosis of native arteries of extremities with rest pain, unspecified extremity: Secondary | ICD-10-CM | POA: Diagnosis not present

## 2020-11-22 DIAGNOSIS — Z823 Family history of stroke: Secondary | ICD-10-CM | POA: Diagnosis not present

## 2020-11-22 DIAGNOSIS — Z9861 Coronary angioplasty status: Secondary | ICD-10-CM | POA: Diagnosis not present

## 2020-11-22 DIAGNOSIS — Z951 Presence of aortocoronary bypass graft: Secondary | ICD-10-CM | POA: Diagnosis not present

## 2020-11-22 DIAGNOSIS — I2510 Atherosclerotic heart disease of native coronary artery without angina pectoris: Secondary | ICD-10-CM | POA: Diagnosis present

## 2020-11-22 DIAGNOSIS — I130 Hypertensive heart and chronic kidney disease with heart failure and stage 1 through stage 4 chronic kidney disease, or unspecified chronic kidney disease: Secondary | ICD-10-CM | POA: Diagnosis present

## 2020-11-22 DIAGNOSIS — Z79890 Hormone replacement therapy (postmenopausal): Secondary | ICD-10-CM | POA: Diagnosis not present

## 2020-11-22 DIAGNOSIS — E876 Hypokalemia: Secondary | ICD-10-CM | POA: Diagnosis present

## 2020-11-22 LAB — CBC
HCT: 25.8 % — ABNORMAL LOW (ref 36.0–46.0)
Hemoglobin: 8.8 g/dL — ABNORMAL LOW (ref 12.0–15.0)
MCH: 34.2 pg — ABNORMAL HIGH (ref 26.0–34.0)
MCHC: 34.1 g/dL (ref 30.0–36.0)
MCV: 100.4 fL — ABNORMAL HIGH (ref 80.0–100.0)
Platelets: 139 10*3/uL — ABNORMAL LOW (ref 150–400)
RBC: 2.57 MIL/uL — ABNORMAL LOW (ref 3.87–5.11)
RDW: 11.8 % (ref 11.5–15.5)
WBC: 14 10*3/uL — ABNORMAL HIGH (ref 4.0–10.5)
nRBC: 0.2 % (ref 0.0–0.2)

## 2020-11-22 LAB — ECHOCARDIOGRAM COMPLETE
AR max vel: 1.39 cm2
AV Area VTI: 1.3 cm2
AV Area mean vel: 1.28 cm2
AV Mean grad: 11 mmHg
AV Peak grad: 16.6 mmHg
Ao pk vel: 2.04 m/s
Area-P 1/2: 3.43 cm2
Height: 61 in
MV VTI: 1.38 cm2
P 1/2 time: 323 msec
S' Lateral: 3.6 cm
Weight: 2096 oz

## 2020-11-22 LAB — BASIC METABOLIC PANEL
Anion gap: 10 (ref 5–15)
BUN: 23 mg/dL (ref 8–23)
CO2: 26 mmol/L (ref 22–32)
Calcium: 8.7 mg/dL — ABNORMAL LOW (ref 8.9–10.3)
Chloride: 96 mmol/L — ABNORMAL LOW (ref 98–111)
Creatinine, Ser: 1.03 mg/dL — ABNORMAL HIGH (ref 0.44–1.00)
GFR, Estimated: 52 mL/min — ABNORMAL LOW (ref >60)
Glucose, Bld: 129 mg/dL — ABNORMAL HIGH (ref 70–99)
Potassium: 3.4 mmol/L — ABNORMAL LOW (ref 3.5–5.1)
Sodium: 132 mmol/L — ABNORMAL LOW (ref 135–145)

## 2020-11-22 LAB — MAGNESIUM: Magnesium: 1.8 mg/dL (ref 1.7–2.4)

## 2020-11-22 MED ORDER — POTASSIUM CHLORIDE CRYS ER 20 MEQ PO TBCR
40.0000 meq | EXTENDED_RELEASE_TABLET | Freq: Once | ORAL | Status: AC
  Administered 2020-11-22: 40 meq via ORAL
  Filled 2020-11-22: qty 2

## 2020-11-22 NOTE — Progress Notes (Signed)
[facility] Cardiology    SUBJECTIVE: The patient is not answering questions, is mostly nonverbal at this time, and is resting comfortably. The patient's daughter at bedside states that the patient has been sleeping, spoke to her briefly last night, but was quite confused.   Vitals:   11/22/20 0320 11/22/20 0432 11/22/20 0926 11/22/20 1122  BP: (!) 167/59 (!) 159/59 (!) 167/56 (!) 122/49  Pulse: 77 74 79   Resp: 17  18   Temp: 98.7 F (37.1 C)   98.7 F (37.1 C)  TempSrc:      SpO2: 98% 97% 97% 96%  Weight: 58.7 kg     Height:         Intake/Output Summary (Last 24 hours) at 11/22/2020 1137 Last data filed at 11/22/2020 1025 Gross per 24 hour  Intake 480 ml  Output 1100 ml  Net -620 ml      PHYSICAL EXAM  General: Well developed, well nourished, frail, elderly female sleeping in bed, in no acute distress HEENT:  Normocephalic and atramatic Neck:  No JVD.  Lungs: normal effort of breathing on room air Heart: HRRR . 2/6 systolic murmurs.  Abdomen: Bowel sounds are positive, abdomen soft and non-tender  Extremities: No clubbing, cyanosis or edema.   Neuro: somnolent, awakens to verbal stimuli Psych:  Somnolent   LABS: Basic Metabolic Panel: Recent Labs    11/20/20 1829 11/21/20 0625 11/22/20 0403  NA 136 136 132*  K 3.4* 3.3* 3.4*  CL 98 99 96*  CO2 24 23 26   GLUCOSE 136* 129* 129*  BUN 16 17 23   CREATININE 1.15* 0.95 1.03*  CALCIUM 9.2 9.0 8.7*  MG 1.7  --  1.8   Liver Function Tests: No results for input(s): AST, ALT, ALKPHOS, BILITOT, PROT, ALBUMIN in the last 72 hours. No results for input(s): LIPASE, AMYLASE in the last 72 hours. CBC: Recent Labs    11/21/20 0625 11/22/20 0403  WBC 13.8* 14.0*  HGB 10.5* 8.8*  HCT 30.5* 25.8*  MCV 99.7 100.4*  PLT 165 139*   Cardiac Enzymes: No results for input(s): CKTOTAL, CKMB, CKMBINDEX, TROPONINI in the last 72 hours. BNP: Invalid input(s): POCBNP D-Dimer: No results for input(s): DDIMER in the last 72  hours. Hemoglobin A1C: No results for input(s): HGBA1C in the last 72 hours. Fasting Lipid Panel: No results for input(s): CHOL, HDL, LDLCALC, TRIG, CHOLHDL, LDLDIRECT in the last 72 hours. Thyroid Function Tests: No results for input(s): TSH, T4TOTAL, T3FREE, THYROIDAB in the last 72 hours.  Invalid input(s): FREET3 Anemia Panel: No results for input(s): VITAMINB12, FOLATE, FERRITIN, TIBC, IRON, RETICCTPCT in the last 72 hours.  DG Chest 2 View  Result Date: 11/20/2020 CLINICAL DATA:  Shortness of breath EXAM: CHEST - 2 VIEW COMPARISON:  12/31/2017 FINDINGS: Post sternotomy changes. Linear atelectasis or scar at the left base. No focal consolidation or effusion. Stable cardiomediastinal silhouette with aortic atherosclerosis. No pneumothorax. Possible calcified loose bodies at the right shoulder. IMPRESSION: No active cardiopulmonary disease. Linear atelectasis or scar at the left base. Electronically Signed   By: Donavan Foil M.D.   On: 11/20/2020 19:01   PERIPHERAL VASCULAR CATHETERIZATION  Result Date: 11/20/2020 See op note  ECHOCARDIOGRAM COMPLETE  Result Date: 11/22/2020    ECHOCARDIOGRAM REPORT   Patient Name:   [name] Date of Exam: 11/21/2020 Medical Rec #:  [id number]      Height:       61.0 in Accession #:    [id number]     Weight:  131.0 lb Date of Birth:  05/09/1929       BSA:          1.578 m Patient Age:    87 years       BP:           160/69 mmHg Patient Gender: F              HR:           80 bpm. Exam Location:  [facility] Procedure: 2D Echo, Color Doppler and Cardiac Doppler Indications:     R07.9 Chest Pain  History:         Patient has prior history of Echocardiogram examinations. CHF,                  Prior CABG, Stroke, Signs/Symptoms:Murmur; Risk                  Factors:Hypertension.  Sonographer:     Charmayne Sheer RDCS (AE) Referring Phys:  [id number] Dessa Phi Diagnosing Phys: Isaias Cowman MD  Sonographer Comments: Suboptimal subcostal window. IMPRESSIONS   1. Left ventricular ejection fraction, by estimation, is 40 to 45%. The left ventricle has mildly decreased function. The left ventricle has no regional wall motion abnormalities. There is mild left ventricular hypertrophy. Left ventricular diastolic parameters are consistent with Grade I diastolic dysfunction (impaired relaxation).  2. Right ventricular systolic function is normal. The right ventricular size is normal.  3. The mitral valve is normal in structure. Moderate mitral valve regurgitation. No evidence of mitral stenosis.  4. Tricuspid valve regurgitation is moderate.  5. The aortic valve is normal in structure. Aortic valve regurgitation is mild. Mild to moderate aortic valve stenosis.  6. The inferior vena cava is normal in size with greater than 50% respiratory variability, suggesting right atrial pressure of 3 mmHg. FINDINGS  Left Ventricle: Left ventricular ejection fraction, by estimation, is 40 to 45%. The left ventricle has mildly decreased function. The left ventricle has no regional wall motion abnormalities. The left ventricular internal cavity size was normal in size. There is mild left ventricular hypertrophy. Left ventricular diastolic parameters are consistent with Grade I diastolic dysfunction (impaired relaxation). Right Ventricle: The right ventricular size is normal. No increase in right ventricular wall thickness. Right ventricular systolic function is normal. Left Atrium: Left atrial size was normal in size. Right Atrium: Right atrial size was normal in size. Pericardium: There is no evidence of pericardial effusion. Mitral Valve: The mitral valve is normal in structure. Moderate mitral valve regurgitation. No evidence of mitral valve stenosis. MV peak gradient, 14.9 mmHg. The mean mitral valve gradient is 6.0 mmHg. Tricuspid Valve: The tricuspid valve is normal in structure. Tricuspid valve regurgitation is moderate . No evidence of tricuspid stenosis. Aortic Valve: The aortic valve  is normal in structure. Aortic valve regurgitation is mild. Aortic regurgitation PHT measures 323 msec. Mild to moderate aortic stenosis is present. Aortic valve mean gradient measures 11.0 mmHg. Aortic valve peak gradient measures 16.6 mmHg. Aortic valve area, by VTI measures 1.30 cm. Pulmonic Valve: The pulmonic valve was normal in structure. Pulmonic valve regurgitation is not visualized. No evidence of pulmonic stenosis. Aorta: The aortic root is normal in size and structure. Venous: The inferior vena cava is normal in size with greater than 50% respiratory variability, suggesting right atrial pressure of 3 mmHg. IAS/Shunts: No atrial level shunt detected by color flow Doppler.  LEFT VENTRICLE PLAX 2D LVIDd:  4.30 cm  Diastology LVIDs:         3.60 cm  LV e' lateral:   6.64 cm/s LV PW:         1.00 cm  LV E/e' lateral: 17.0 LV IVS:        0.80 cm LVOT diam:     1.90 cm LV SV:         54 LV SV Index:   34 LVOT Area:     2.84 cm  LEFT ATRIUM             Index LA diam:        4.40 cm 2.79 cm/m LA Vol (A2C):   51.3 ml 32.51 ml/m LA Vol (A4C):   62.2 ml 39.42 ml/m LA Biplane Vol: 62.6 ml 39.67 ml/m  AORTIC VALVE                    PULMONIC VALVE AV Area (Vmax):    1.39 cm     PV Vmax:       1.22 m/s AV Area (Vmean):   1.28 cm     PV Vmean:      83.500 cm/s AV Area (VTI):     1.30 cm     PV VTI:        0.195 m AV Vmax:           204.00 cm/s  PV Peak grad:  6.0 mmHg AV Vmean:          157.000 cm/s PV Mean grad:  3.0 mmHg AV VTI:            0.418 m AV Peak Grad:      16.6 mmHg AV Mean Grad:      11.0 mmHg LVOT Vmax:         99.80 cm/s LVOT Vmean:        70.900 cm/s LVOT VTI:          0.191 m LVOT/AV VTI ratio: 0.46 AI PHT:            323 msec  AORTA Ao Root diam: 3.30 cm MITRAL VALVE MV Area (PHT): 3.43 cm     SHUNTS MV Area VTI:   1.38 cm     Systemic VTI:  0.19 m MV Peak grad:  14.9 mmHg    Systemic Diam: 1.90 cm MV Mean grad:  6.0 mmHg MV Vmax:       1.93 m/s MV Vmean:      114.0 cm/s MV Decel Time:  221 msec MV E velocity: 113.00 cm/s MV A velocity: 161.00 cm/s MV E/A ratio:  0.70 Isaias Cowman MD Electronically signed by Isaias Cowman MD Signature Date/Time: 11/22/2020/8:41:29 AM    Final      Echo LVEF 40-45% with no regional wall motion abnormalities with moderate MR, TR, mild to moderate AS.   TELEMETRY: sinus rhythm, 68 bpm  ASSESSMENT AND PLAN:  Principal Problem:   PAD (peripheral artery disease) (HCC) Active Problems:   Acquired hypothyroidism   Benign essential HTN   Major depressive disorder, recurrent, in remission ([place])   Pure hypercholesterolemia   Chest pain   Hypotension   1. Chest pain, with high sensitivity troponin mildly elevated with a delta (35-> 52-> 255-> 204) with known coronary artery disease, status post lower extremity angiography and stent placement 11/20/2020. ECG revealed sinus rhythm with chronic LBBB. No apparent recurrent significant chest pain. 2D echocardiogram this admission is stable from previous in 2018, revealing mildly  reduced LV function with LVEF 40-45% with no regional wall motion abnormalities with moderate MR, TR, mild to moderate AS. Patient has been resting and confused.  2. Hypotension and near syncope, resolved 3. Peripheral artery disease, status post right lower extremity angiogram and stent placement x 3 on 11/20/2020 4. Coronary artery disease, status post CABG x3 in 1999, occluded SVG to PDA/RPL and SVG to OM 3 by cardiac catheterization in 2007 5. Chronic systolic CHF with LVEF 20-72% 6. Progressive memory decline and confusion  Plan: 1. Continue aspirin and Plavix and Crestor 2. Continue metoprolol tartrate 12.5 mg BID 3. Continue to hold all other antihypertensives 4. Defer further cardiac diagnostics at this time in light of patient's stable echocardiogram with no RWMA, down-trending troponin, no significant recurrent chest pain, and overall frailty. 5. Recommend physical therapy and discharge to nursing  facility  Sign off for now; please call/[facility] with any questions  Clabe Seal, Hershal Coria 11/22/2020 11:37 AM

## 2020-11-22 NOTE — Progress Notes (Signed)
Progress Note    Yvonne Parrish  NWG:[id number] DOB: 23-Aug-1928  DOA: 11/20/2020 PCP: Tracie Harrier, MD      Brief Narrative:    Medical records reviewed and are as summarized below:  Yvonne Parrish is a 87 y.o. female       Assessment/Plan:   Principal Problem:   PAD (peripheral artery disease) ([place]) Active Problems:   Acquired hypothyroidism   Benign essential HTN   Major depressive disorder, recurrent, in remission ([place])   Pure hypercholesterolemia   Chest pain   Hypotension   Body mass index is 24.43 kg/m.    Chest pain, elevated troponins in a patient with CAD s/p CABG in 1999: Elevated troponins probably from demand ischemia.  2D echo showed EF estimated at 40 to 45% and grade 1 diastolic dysfunction.  No further work-up per cardiologist and cardiologist has signed off.  Hypotension with presyncope: BP has improved.  PVD s/p angioplasty of right anterior tibial artery and right popliteal artery on 11/21/2018  Other comorbidities include CKD stage IIIa, chronic microcytic anemia, asthma, MDS, history of stroke, chronic diastolic and systolic CHF  Debility: PT and OT recommend discharge to SNF.    Diet Order            DIET SOFT Room service appropriate? Yes; Fluid consistency: Thin  Diet effective now                    Consultants:  Cardiologist  Procedures:  Angioplasty of right anterior tibial artery and right popliteal artery on 11/20/2020    Medications:   . aspirin EC  81 mg Oral Daily  . clopidogrel  75 mg Oral Q breakfast  . FLUoxetine  40 mg Oral Daily  . levothyroxine  88 mcg Oral QAC breakfast  . metoprolol tartrate  12.5 mg Oral BID  . rosuvastatin  5 mg Oral Once per day on Mon Wed Fri  . sodium chloride flush  3 mL Intravenous Q12H   Continuous Infusions: . sodium chloride       Anti-infectives (From admission, onward)   Start     Dose/Rate Route Frequency Ordered Stop   11/20/20 1121  ceFAZolin (ANCEF)  2-4 GM/100ML-% IVPB       Note to Pharmacy: Maynor, Erin   : cabinet override      11/20/20 1121 11/20/20 2329   11/20/20 0200  ceFAZolin (ANCEF) IVPB 2g/100 mL premix  Status:  Discontinued        2 g 200 mL/hr over 30 Minutes Intravenous  Once 11/20/20 0158 11/20/20 1950             Family Communication/Anticipated D/C date and plan/Code Status   DVT prophylaxis:      Code Status: DNR  Family Communication: Plan discussed with her daughter, Dario Guardian, at the bedside Disposition Plan:    Status is: Inpatient  Remains inpatient appropriate because:Unsafe d/c plan and Inpatient level of care appropriate due to severity of illness   Dispo: The patient is from: Home              Anticipated d/c is to: SNF              Patient currently is not medically stable to d/c.   Difficult to place patient No           Subjective:   Interval events noted.  She has no recollection of events that transpired yesterday.  No chest pain or shortness  of breath.  Objective:    Vitals:   11/22/20 0320 11/22/20 0432 11/22/20 0926 11/22/20 1122  BP: (!) 167/59 (!) 159/59 (!) 167/56 (!) 122/49  Pulse: 77 74 79   Resp: 17  18   Temp: 98.7 F (37.1 C)   98.7 F (37.1 C)  TempSrc:      SpO2: 98% 97% 97% 96%  Weight: 58.7 kg     Height:       No data found.   Intake/Output Summary (Last 24 hours) at 11/22/2020 1508 Last data filed at 11/22/2020 1406 Gross per 24 hour  Intake 240 ml  Output 1100 ml  Net -860 ml   Filed Weights   11/20/20 1139 11/22/20 0320  Weight: 59.4 kg 58.7 kg    Exam:  GEN: NAD SKIN: Warm and dry EYES: EOMI ENT: MMM CV: RRR PULM: CTA B ABD: soft, ND, NT, +BS CNS: AAO x 2 (person and place), non focal EXT: No edema or tenderness        Data Reviewed:   I have personally reviewed following labs and imaging studies:  Labs: Labs show the following:   Basic Metabolic Panel: Recent Labs  Lab 11/20/20 1213 11/20/20 1829  11/20/20 1829 11/21/20 0625 11/22/20 0403  NA  --  136  --  136 132*  K  --  3.4*   < > 3.3* 3.4*  CL  --  98  --  99 96*  CO2  --  24  --  23 26  GLUCOSE  --  136*  --  129* 129*  BUN 16 16  --  17 23  CREATININE 1.07* 1.15*  --  0.95 1.03*  CALCIUM  --  9.2  --  9.0 8.7*  MG  --  1.7  --   --  1.8   < > = values in this interval not displayed.   GFR Estimated Creatinine Clearance: 30.5 mL/min (A) (by C-G formula based on SCr of 1.03 mg/dL (H)). Liver Function Tests: No results for input(s): AST, ALT, ALKPHOS, BILITOT, PROT, ALBUMIN in the last 168 hours. No results for input(s): LIPASE, AMYLASE in the last 168 hours. No results for input(s): AMMONIA in the last 168 hours. Coagulation profile No results for input(s): INR, PROTIME in the last 168 hours.  CBC: Recent Labs  Lab 11/20/20 1829 11/21/20 0625 11/22/20 0403  WBC 15.6* 13.8* 14.0*  HGB 10.4* 10.5* 8.8*  HCT 30.9* 30.5* 25.8*  MCV 101.6* 99.7 100.4*  PLT 180 165 139*   Cardiac Enzymes: No results for input(s): CKTOTAL, CKMB, CKMBINDEX, TROPONINI in the last 168 hours. BNP (last 3 results) No results for input(s): PROBNP in the last 8760 hours. CBG: No results for input(s): GLUCAP in the last 168 hours. D-Dimer: No results for input(s): DDIMER in the last 72 hours. Hgb A1c: No results for input(s): HGBA1C in the last 72 hours. Lipid Profile: No results for input(s): CHOL, HDL, LDLCALC, TRIG, CHOLHDL, LDLDIRECT in the last 72 hours. Thyroid function studies: No results for input(s): TSH, T4TOTAL, T3FREE, THYROIDAB in the last 72 hours.  Invalid input(s): FREET3 Anemia work up: No results for input(s): VITAMINB12, FOLATE, FERRITIN, TIBC, IRON, RETICCTPCT in the last 72 hours. Sepsis Labs: Recent Labs  Lab 11/20/20 1829 11/21/20 0625 11/22/20 0403  WBC 15.6* 13.8* 14.0*    Microbiology Recent Results (from the past 240 hour(s))  SARS CORONAVIRUS 2 (TAT 6-24 HRS) Nasopharyngeal Nasopharyngeal Swab      Status: None   Collection  Time: 11/16/20 12:13 PM   Specimen: Nasopharyngeal Swab  Result Value Ref Range Status   SARS Coronavirus 2 NEGATIVE NEGATIVE Final    Comment: (NOTE) SARS-CoV-2 target nucleic acids are NOT DETECTED.  The SARS-CoV-2 RNA is generally detectable in upper and lower respiratory specimens during the acute phase of infection. Negative results do not preclude SARS-CoV-2 infection, do not rule out co-infections with other pathogens, and should not be used as the sole basis for treatment or other patient management decisions. Negative results must be combined with clinical observations, patient history, and epidemiological information. The expected result is Negative.  Fact Sheet for Patients: SugarRoll.be  Fact Sheet for Healthcare Providers: https://www.woods-mathews.com/  This test is not yet approved or cleared by the [place] FDA and  has been authorized for detection and/or diagnosis of SARS-CoV-2 by FDA under an Emergency Use Authorization (EUA). This EUA will remain  in effect (meaning this test can be used) for the duration of the COVID-19 declaration under Se ction 564(b)(1) of the Act, 21 U.S.C. section 360bbb-3(b)(1), unless the authorization is terminated or revoked sooner.  Performed at [facility], [place] [address] [place], [place] [address]     Procedures and diagnostic studies:  DG Chest 2 View  Result Date: 11/20/2020 CLINICAL DATA:  Shortness of breath EXAM: CHEST - 2 VIEW COMPARISON:  12/31/2017 FINDINGS: Post sternotomy changes. Linear atelectasis or scar at the left base. No focal consolidation or effusion. Stable cardiomediastinal silhouette with aortic atherosclerosis. No pneumothorax. Possible calcified loose bodies at the right shoulder. IMPRESSION: No active cardiopulmonary disease. Linear atelectasis or scar at the left base. Electronically Signed   By: Donavan Foil M.D.   On:  11/20/2020 19:01   ECHOCARDIOGRAM COMPLETE  Result Date: 11/22/2020    ECHOCARDIOGRAM REPORT   Patient Name:   Yvonne Parrish Date of Exam: 11/21/2020 Medical Rec #:  [id number]      Height:       61.0 in Accession #:    [id number]     Weight:       131.0 lb Date of Birth:  [birth year]       BSA:          1.578 m Patient Age:    14 years       BP:           160/69 mmHg Patient Gender: F              HR:           80 bpm. Exam Location:  [facility] Procedure: 2D Echo, Color Doppler and Cardiac Doppler Indications:     R07.9 Chest Pain  History:         Patient has prior history of Echocardiogram examinations. CHF,                  Prior CABG, Stroke, Signs/Symptoms:Murmur; Risk                  Factors:Hypertension.  Sonographer:     Charmayne Sheer RDCS (AE) Referring Phys:  [id number] Dessa Phi Diagnosing Phys: Isaias Cowman MD  Sonographer Comments: Suboptimal subcostal window. IMPRESSIONS  1. Left ventricular ejection fraction, by estimation, is 40 to 45%. The left ventricle has mildly decreased function. The left ventricle has no regional wall motion abnormalities. There is mild left ventricular hypertrophy. Left ventricular diastolic parameters are consistent with Grade I diastolic dysfunction (impaired relaxation).  2. Right ventricular systolic function is normal. The right ventricular  size is normal.  3. The mitral valve is normal in structure. Moderate mitral valve regurgitation. No evidence of mitral stenosis.  4. Tricuspid valve regurgitation is moderate.  5. The aortic valve is normal in structure. Aortic valve regurgitation is mild. Mild to moderate aortic valve stenosis.  6. The inferior vena cava is normal in size with greater than 50% respiratory variability, suggesting right atrial pressure of 3 mmHg. FINDINGS  Left Ventricle: Left ventricular ejection fraction, by estimation, is 40 to 45%. The left ventricle has mildly decreased function. The left ventricle has no regional wall motion abnormalities.  The left ventricular internal cavity size was normal in size. There is mild left ventricular hypertrophy. Left ventricular diastolic parameters are consistent with Grade I diastolic dysfunction (impaired relaxation). Right Ventricle: The right ventricular size is normal. No increase in right ventricular wall thickness. Right ventricular systolic function is normal. Left Atrium: Left atrial size was normal in size. Right Atrium: Right atrial size was normal in size. Pericardium: There is no evidence of pericardial effusion. Mitral Valve: The mitral valve is normal in structure. Moderate mitral valve regurgitation. No evidence of mitral valve stenosis. MV peak gradient, 14.9 mmHg. The mean mitral valve gradient is 6.0 mmHg. Tricuspid Valve: The tricuspid valve is normal in structure. Tricuspid valve regurgitation is moderate . No evidence of tricuspid stenosis. Aortic Valve: The aortic valve is normal in structure. Aortic valve regurgitation is mild. Aortic regurgitation PHT measures 323 msec. Mild to moderate aortic stenosis is present. Aortic valve mean gradient measures 11.0 mmHg. Aortic valve peak gradient measures 16.6 mmHg. Aortic valve area, by VTI measures 1.30 cm. Pulmonic Valve: The pulmonic valve was normal in structure. Pulmonic valve regurgitation is not visualized. No evidence of pulmonic stenosis. Aorta: The aortic root is normal in size and structure. Venous: The inferior vena cava is normal in size with greater than 50% respiratory variability, suggesting right atrial pressure of 3 mmHg. IAS/Shunts: No atrial level shunt detected by color flow Doppler.  LEFT VENTRICLE PLAX 2D LVIDd:         4.30 cm  Diastology LVIDs:         3.60 cm  LV e' lateral:   6.64 cm/s LV PW:         1.00 cm  LV E/e' lateral: 17.0 LV IVS:        0.80 cm LVOT diam:     1.90 cm LV SV:         54 LV SV Index:   34 LVOT Area:     2.84 cm  LEFT ATRIUM             Index LA diam:        4.40 cm 2.79 cm/m LA Vol (A2C):   51.3 ml  32.51 ml/m LA Vol (A4C):   62.2 ml 39.42 ml/m LA Biplane Vol: 62.6 ml 39.67 ml/m  AORTIC VALVE                    PULMONIC VALVE AV Area (Vmax):    1.39 cm     PV Vmax:       1.22 m/s AV Area (Vmean):   1.28 cm     PV Vmean:      83.500 cm/s AV Area (VTI):     1.30 cm     PV VTI:        0.195 m AV Vmax:           204.00 cm/s  PV  Peak grad:  6.0 mmHg AV Vmean:          157.000 cm/s PV Mean grad:  3.0 mmHg AV VTI:            0.418 m AV Peak Grad:      16.6 mmHg AV Mean Grad:      11.0 mmHg LVOT Vmax:         99.80 cm/s LVOT Vmean:        70.900 cm/s LVOT VTI:          0.191 m LVOT/AV VTI ratio: 0.46 AI PHT:            323 msec  AORTA Ao Root diam: 3.30 cm MITRAL VALVE MV Area (PHT): 3.43 cm     SHUNTS MV Area VTI:   1.38 cm     Systemic VTI:  0.19 m MV Peak grad:  14.9 mmHg    Systemic Diam: 1.90 cm MV Mean grad:  6.0 mmHg MV Vmax:       1.93 m/s MV Vmean:      114.0 cm/s MV Decel Time: 221 msec MV E velocity: 113.00 cm/s MV A velocity: 161.00 cm/s MV E/A ratio:  0.70 Isaias Cowman MD Electronically signed by Isaias Cowman MD Signature Date/Time: 11/22/2020/8:41:29 AM    Final                LOS: 0 days   Tyrika Newman  [facility]   Pager on [email or web address]. If 7PM-7AM, please contact night-coverage at [email or web address]     11/22/2020, 3:08 PM

## 2020-11-22 NOTE — Progress Notes (Signed)
Physical Therapy Treatment Patient Details Name: Yvonne Parrish MRN: [id number] DOB: Jun 02, 1929 Today's Date: 11/22/2020    History of Present Illness Patient is 87 year old female admitted for angiography with stent of R LE. Was getting ready to discharge home and had syncope and chest pain. Therefore was admitted fo further work up.  S/P CABD, dyslipidemia, HTN. PMH: stroke, CHF, hypertension, hypothyroidism, MDS, peripheral artery disease    PT Comments    Pt received in bed with daughter at bedside. Agreeable to OOB activity despite c/c of fatigue.  Pt able to transfer to EOB with CG/MinA, unsupported sitting balance with feet on floor x 5 minutes with good balance.  No dizziness noted, BP 132/44  HR 67bpm, sats 95%.  Pt transferred sit to stand with MinA to RW, short distance gait to recliner with good demonstration of safety awareness.  Pt positioned upright in chair for lunch.  Discussed d/c needs with pt's daughter who continues to agree on short term SNF placement once medically stable.    Follow Up Recommendations  SNF;Supervision/Assistance - 24 hour     Equipment Recommendations  None recommended by PT    Recommendations for Other Services       Precautions / Restrictions Precautions Precautions: Fall Restrictions Weight Bearing Restrictions: No    Mobility  Bed Mobility Overal bed mobility: Needs Assistance             General bed mobility comments: increased time and effort needed to perform sit to supine    Transfers Overall transfer level: Needs assistance Equipment used: Rolling walker (2 wheeled) Transfers: Sit to/from Stand Sit to Stand: Min assist            Ambulation/Gait Ambulation/Gait assistance: Min guard Gait Distance (Feet): 5 Feet Assistive device: Rolling walker (2 wheeled) Gait Pattern/deviations: Step-through pattern;Decreased step length - right;Decreased step length - left;Steppage;Decreased dorsiflexion - right Gait velocity:  decr   General Gait Details: Patient is steady with RW, demonstrates steppage gait on right due to foot drop. (Daughter reports she does not use AFO, she used to but now compensates.) Fatigued quickly with ambulation requesting to turn around just after counter in room. Daughter also reports 2 falls in last 3 mo.   Stairs             Wheelchair Mobility    Modified Rankin (Stroke Patients Only)       Balance                                            Cognition Arousal/Alertness: Awake/alert Behavior During Therapy: WFL for tasks assessed/performed Overall Cognitive Status: History of cognitive impairments - at baseline                                 General Comments: Lethargic, no dizziness with changes in position.      Exercises      General Comments General comments (skin integrity, edema, etc.): VSS througout session, O2 sats remained in upper 90's on RA      Pertinent Vitals/Pain Pain Assessment: No/denies pain    Home Living                      Prior Function            PT  Goals (current goals can now be found in the care plan section) Acute Rehab PT Goals Patient Stated Goal: to return home, improve strength    Frequency    Min 2X/week      PT Plan Current plan remains appropriate    Co-evaluation              AM-PAC PT "6 Clicks" Mobility   Outcome Measure  Help needed turning from your back to your side while in a flat bed without using bedrails?: A Little Help needed moving from lying on your back to sitting on the side of a flat bed without using bedrails?: A Little Help needed moving to and from a bed to a chair (including a wheelchair)?: A Little Help needed standing up from a chair using your arms (e.g., wheelchair or bedside chair)?: A Little Help needed to walk in hospital room?: A Lot Help needed climbing 3-5 steps with a railing? : A Lot 6 Click Score: 16    End of Session  Equipment Utilized During Treatment: Gait belt Activity Tolerance: Patient limited by fatigue Patient left: in chair;with call bell/phone within reach;with family/visitor present Nurse Communication: Mobility status PT Visit Diagnosis: Other abnormalities of gait and mobility (R26.89);Muscle weakness (generalized) (M62.81);History of falling (Z91.81);Difficulty in walking, not elsewhere classified (R26.2)     Time: 6283-1517 PT Time Calculation (min) (ACUTE ONLY): 46 min  Charges:  $Therapeutic Activity: 38-52 mins                     Yvonne Parrish, PTA   Yvonne Parrish 11/22/2020, 1:08 PM

## 2020-11-22 NOTE — NC FL2 (Signed)
[place] LEVEL OF CARE SCREENING TOOL     IDENTIFICATION  Patient Name: Yvonne Parrish Birthdate: [birth year] Sex: female Admission Date (Current Location): 11/20/2020  [place] and [place] Number:  Engineering geologist and Address:  [facility], [address] [place], [place] [address]      Provider Number: [id number]  Attending Physician Name and Address:  Jennye Boroughs, MD  Relative Name and Phone Number:  Raechel Chute [phone]    Current Level of Care: Hospital Recommended Level of Care: [place] Prior Approval Number:    Date Approved/Denied:   PASRR Number: [id number]  Discharge Plan: SNF    Current Diagnoses: Patient Active Problem List   Diagnosis Date Noted  . PAD (peripheral artery disease) ([place]) 11/20/2020  . Chest pain 11/20/2020  . Hypotension 11/20/2020  . Mild left ventricular systolic dysfunction 54/56/2563  . Major depressive disorder, recurrent, in remission ([place]) 06/07/2019  . Anemia 02/03/2019  . Renal insufficiency 02/03/2019  . Heart palpitations 04/15/2018  . History of CVA (cerebrovascular accident) 06/10/2017  . Carpal tunnel syndrome of right wrist 12/04/2016  . Osteoarthritis of finger of right hand 12/04/2016  . Primary osteoarthritis of first carpometacarpal joint of right hand 12/04/2016  . Hemiparesis affecting dominant side as late effect of cerebrovascular accident (CVA) ([place]) 10/01/2016  . Right foot drop 10/01/2016  . Benign essential HTN   . Chronic systolic CHF (congestive heart failure) ([place])   . Acute blood loss anemia   . Hypokalemia   . Sleep disturbance   . Gait disturbance, post-stroke 08/22/2016  . Left pontine stroke ([place]) 08/22/2016  . Slurred speech   . Chronic obstructive pulmonary disease ([place])   . Coronary artery disease involving coronary bypass graft of native heart without angina pectoris   . Leukocytosis   . Hyperglycemia   . Right hemiparesis  ([place])   . COPD exacerbation ([place]) 08/20/2016  . Aphasia 08/20/2016  . Asthma without status asthmaticus 08/20/2016  . CHF (congestive heart failure) ([place]) 08/20/2016  . Hyperlipidemia, unspecified 08/20/2016  . Hypertension 08/20/2016  . Myelodysplastic syndrome ([place]) 08/20/2016  . Osteoporosis, post-menopausal 08/20/2016  . Stroke (cerebrum) ([place]) 08/20/2016  . Ischemic stroke ([place])   . Cardiomyopathy, dilated ([place]) 01/10/2016  . H/O cardiac catheterization 01/10/2016  . SOB (shortness of breath) on exertion 01/10/2016  . Chronic back pain 11/01/2014  . History of coronary artery bypass graft 11/01/2014  . Pure hypercholesterolemia 11/01/2014  . Acquired hypothyroidism 07/05/2014    Orientation RESPIRATION BLADDER Height & Weight     Self  Normal External catheter Weight: 58.7 kg Height:  5\' 1"  (154.9 cm)  BEHAVIORAL SYMPTOMS/MOOD NEUROLOGICAL BOWEL NUTRITION STATUS      Continent Diet  AMBULATORY STATUS COMMUNICATION OF NEEDS Skin   Limited Assist Verbally Normal                       Personal Care Assistance Level of Assistance  Bathing,Feeding,Dressing Bathing Assistance: Limited assistance Feeding assistance: Limited assistance Dressing Assistance: Limited assistance     Functional Limitations Info  Sight,Hearing,Speech Sight Info: Adequate (wears glasses) Hearing Info: Impaired (hearing aids) Speech Info: Adequate    SPECIAL CARE FACTORS FREQUENCY  PT (By licensed PT),OT (By licensed OT)     PT Frequency: 5X WEEK OT Frequency: 5X WEEK            Contractures Contractures Info: Not present    Additional Factors Info  Code Status,Allergies Code Status Info:  DNR Allergies Info: Food, wheat bran           Current Medications (11/22/2020):  This is the current hospital active medication list Current Facility-Administered Medications  Medication Dose Route Frequency Provider Last Rate Last Admin  . 0.9 %  sodium chloride infusion  250 mL  Intravenous PRN Algernon Huxley, MD      . acetaminophen (TYLENOL) tablet 650 mg  650 mg Oral Q4H PRN Algernon Huxley, MD      . aspirin EC tablet 81 mg  81 mg Oral Daily Clabe Seal, PA-C   81 mg at 11/22/20 0925  . clopidogrel (PLAVIX) tablet 75 mg  75 mg Oral Q breakfast Algernon Huxley, MD   75 mg at 11/22/20 0925  . FLUoxetine (PROZAC) capsule 40 mg  40 mg Oral Daily Dessa Phi, DO   40 mg at 11/22/20 1021  . hydrALAZINE (APRESOLINE) injection 5 mg  5 mg Intravenous Q20 Min PRN Algernon Huxley, MD      . labetalol (NORMODYNE) injection 10 mg  10 mg Intravenous Q10 min PRN Algernon Huxley, MD      . levothyroxine (SYNTHROID) tablet 88 mcg  88 mcg Oral QAC breakfast Dessa Phi, DO   88 mcg at 11/22/20 1173  . metoprolol tartrate (LOPRESSOR) tablet 12.5 mg  12.5 mg Oral BID Clabe Seal, PA-C   12.5 mg at 11/22/20 5670  . ondansetron (ZOFRAN) injection 4 mg  4 mg Intravenous Q6H PRN Kris Hartmann, NP      . rosuvastatin (CRESTOR) tablet 5 mg  5 mg Oral Once per day on Mon Wed Fri Dessa Phi, DO   5 mg at 11/22/20 1410  . sodium chloride flush (NS) 0.9 % injection 3 mL  3 mL Intravenous Q12H Algernon Huxley, MD   3 mL at 11/22/20 0926  . sodium chloride flush (NS) 0.9 % injection 3 mL  3 mL Intravenous PRN Lucky Cowboy Erskine Squibb, MD         Discharge Medications: Please see discharge summary for a list of discharge medications.  Relevant Imaging Results:  Relevant Lab Results:   Additional Information SS# [id number]  Kerin Salen, RN

## 2020-11-23 DIAGNOSIS — R0602 Shortness of breath: Secondary | ICD-10-CM | POA: Diagnosis not present

## 2020-11-23 DIAGNOSIS — I70229 Atherosclerosis of native arteries of extremities with rest pain, unspecified extremity: Secondary | ICD-10-CM | POA: Diagnosis not present

## 2020-11-23 LAB — BASIC METABOLIC PANEL
Anion gap: 9 (ref 5–15)
BUN: 27 mg/dL — ABNORMAL HIGH (ref 8–23)
CO2: 27 mmol/L (ref 22–32)
Calcium: 8.6 mg/dL — ABNORMAL LOW (ref 8.9–10.3)
Chloride: 97 mmol/L — ABNORMAL LOW (ref 98–111)
Creatinine, Ser: 1.15 mg/dL — ABNORMAL HIGH (ref 0.44–1.00)
GFR, Estimated: 46 mL/min — ABNORMAL LOW (ref >60)
Glucose, Bld: 116 mg/dL — ABNORMAL HIGH (ref 70–99)
Potassium: 4 mmol/L (ref 3.5–5.1)
Sodium: 133 mmol/L — ABNORMAL LOW (ref 135–145)

## 2020-11-23 LAB — CBC WITH DIFFERENTIAL/PLATELET
Abs Immature Granulocytes: 0.07 10*3/uL (ref 0.00–0.07)
Basophils Absolute: 0.1 10*3/uL (ref 0.0–0.1)
Basophils Relative: 0 %
Eosinophils Absolute: 0.2 10*3/uL (ref 0.0–0.5)
Eosinophils Relative: 2 %
HCT: 25.2 % — ABNORMAL LOW (ref 36.0–46.0)
Hemoglobin: 8.7 g/dL — ABNORMAL LOW (ref 12.0–15.0)
Immature Granulocytes: 1 %
Lymphocytes Relative: 11 %
Lymphs Abs: 1.4 10*3/uL (ref 0.7–4.0)
MCH: 34.7 pg — ABNORMAL HIGH (ref 26.0–34.0)
MCHC: 34.5 g/dL (ref 30.0–36.0)
MCV: 100.4 fL — ABNORMAL HIGH (ref 80.0–100.0)
Monocytes Absolute: 1.9 10*3/uL — ABNORMAL HIGH (ref 0.1–1.0)
Monocytes Relative: 15 %
Neutro Abs: 9 10*3/uL — ABNORMAL HIGH (ref 1.7–7.7)
Neutrophils Relative %: 71 %
Platelets: 139 10*3/uL — ABNORMAL LOW (ref 150–400)
RBC: 2.51 MIL/uL — ABNORMAL LOW (ref 3.87–5.11)
RDW: 11.9 % (ref 11.5–15.5)
WBC: 12.6 10*3/uL — ABNORMAL HIGH (ref 4.0–10.5)
nRBC: 0 % (ref 0.0–0.2)

## 2020-11-23 NOTE — Progress Notes (Signed)
Occupational Therapy Treatment Patient Details Name: Yvonne Parrish MRN: [id number] DOB: 1928-12-17 Today's Date: 11/23/2020    History of present illness Patient is 87 year old female admitted for angiography with stent of R LE. Was getting ready to discharge home and had syncope and chest pain. Therefore was admitted fo further work up.  S/P CABD, dyslipidemia, HTN. PMH: stroke, CHF, hypertension, hypothyroidism, MDS, peripheral artery disease   OT comments  Pt seen for OT treatment on this date. Upon arrival to room, pt awake and daughter Cherlyn Roberts) present. Pt oriented to self, place, and month/year, and agreeable to OT treatment. Following bed mobility, pt appeared to be incontinent of stool, with pt stating that she was unaware. This date, pt with decreased activity tolerance, strength, and balance, requiring MIN GUARD for LB dressing, MIN GUARD for functional mobility of short household distances (~10 feet), MIN GUARD-MIN A for toilet transfers, and MIN A for peri-care (in addition to increased time/effort throughout). Pt with improved awareness of deficits this date, stating "its usually not this hard" and acknowledging the benefits of going to rehab to improve activity tolerance prior to returning home. Pt is making good progress toward goals and continues to benefit from skilled OT services to maximize return to PLOF and minimize risk of future falls, injury, caregiver burden, and readmission. Will continue to follow POC. Discharge recommendation remains appropriate.    Follow Up Recommendations  SNF    Equipment Recommendations  3 in 1 bedside commode       Precautions / Restrictions Precautions Precautions: Fall Restrictions Weight Bearing Restrictions: No       Mobility Bed Mobility Overal bed mobility: Modified Independent             General bed mobility comments: increased time and effort only    Transfers Overall transfer level: Needs assistance Equipment used:  Rolling walker (2 wheeled) Transfers: Sit to/from Stand Sit to Stand: Min guard;Min assist         General transfer comment: MIN GUARD for initial 3 transfers. Pt required MIN A during 4th transfer d/t fatigue    Balance Overall balance assessment: Needs assistance Sitting-balance support: No upper extremity supported;Feet supported Sitting balance-Leahy Scale: Good Sitting balance - Comments: Good sitting balance while reaching beyond BOS to don socks   Standing balance support: Bilateral upper extremity supported;During functional activity Standing balance-Leahy Scale: Fair Standing balance comment: reliant on RW                           ADL either performed or assessed with clinical judgement   ADL Overall ADL's : Needs assistance/impaired     Grooming: Wash/dry hands;Set up;Supervision/safety;Sitting Grooming Details (indicate cue type and reason): to wash hands with washcloth             Lower Body Dressing: Min guard;Sitting/lateral leans Lower Body Dressing Details (indicate cue type and reason): MIN GUARD to don/doff socks and don shoes. Required increased time/effort Toilet Transfer: Min guard;Ambulation;BSC;RW   Toileting- Clothing Manipulation and Hygiene: Minimal assistance;Sit to/from stand Toileting - Clothing Manipulation Details (indicate cue type and reason): MIN A in setting of fatigue     Functional mobility during ADLs: Min guard;Rolling walker (ambulating ~10 feet)                 Cognition Arousal/Alertness: Awake/alert Behavior During Therapy: WFL for tasks assessed/performed Overall Cognitive Status: History of cognitive impairments - at baseline  General Comments: Pt orieneted to self, place, and month/year. Requires increased processing time. Improved awareness of deficits, with pt acknowledging that she is weaker than usual                   Pertinent Vitals/ Pain        Pain Assessment: No/denies pain     Prior Functioning/Environment              Frequency  Min 1X/week        Progress Toward Goals  OT Goals(current goals can now be found in the care plan section)  Progress towards OT goals: Progressing toward goals  Acute Rehab OT Goals Patient Stated Goal: to return home  Plan Discharge plan remains appropriate;Frequency remains appropriate       AM-PAC OT "6 Clicks" Daily Activity     Outcome Measure   Help from another person eating meals?: None Help from another person taking care of personal grooming?: A Little Help from another person toileting, which includes using toliet, bedpan, or urinal?: A Little Help from another person bathing (including washing, rinsing, drying)?: A Little Help from another person to put on and taking off regular upper body clothing?: A Little Help from another person to put on and taking off regular lower body clothing?: A Little 6 Click Score: 19    End of Session Equipment Utilized During Treatment: Gait belt;Rolling walker  OT Visit Diagnosis: Unsteadiness on feet (R26.81);History of falling (Z91.81);Muscle weakness (generalized) (M62.81)   Activity Tolerance Patient tolerated treatment well   Patient Left in chair;with call bell/phone within reach;with chair alarm set;with family/visitor present   Nurse Communication Mobility status        Time: 3734-2876 OT Time Calculation (min): 43 min  Charges: OT General Charges $OT Visit: 1 Visit OT Treatments $Self Care/Home Management : 38-52 mins  Fredirick Maudlin, OTR/L [place]

## 2020-11-23 NOTE — TOC Progression Note (Signed)
Transition of Care [facility]) - Progression Note    Patient Details  Name: Yvonne Parrish MRN: [id number] Date of Birth: [birth year]  Transition of [facility] CM/SW [place], RN Phone Number: 11/23/2020, 8:55 AM  Clinical Narrative:  Received a call from patients daughter, POA Debbie discussed SNF, given the two accepted facilities [facility] and [facility] declined Kathleen Argue will research [place] and get back with me. Patient prefers to call her cell [phone] if needed.    Expected Discharge Plan: Skilled Nursing Facility Barriers to Discharge: Continued Medical Work up  Expected Discharge Plan and Services Expected Discharge Plan: [place] Choice: [place]   Expected Discharge Date: 11/20/20                                     Social Determinants of Health (SDOH) Interventions    Readmission Risk Interventions No flowsheet data found.

## 2020-11-23 NOTE — Progress Notes (Signed)
[facility] Daily Progress Note   Subjective: (11/20/20)             1.Ultrasound guidance for vascular accessleftfemoral artery 2.Catheter placement into right common femoral artery from left femoral approach 3.Aortogram and selectiverightlower extremity angiogram 4.Percutaneous transluminal angioplasty ofright anterior tibial artery with 2.5 mm diameter by 30 cm length angioplasty balloon 5.Percutaneous transluminal angioplasty of the right popliteal artery and SFA with 4 and 5 mm diameter Lutonix drug-coated angioplasty balloon 6.Viabahn stent placement x3 to the right SFA and popliteal arteries with a 6 mm diameter by 25 cm length, 6 mm diameter by 15 cm length, and a 6 mm diameter by 5 cm length stent 7.StarClose closure device leftfemoral artery  Patient lethargic this AM.  Seems to be resting comfortably.  No issues overnight.  Objective: Vitals:   11/22/20 2034 11/22/20 2035 11/23/20 0334 11/23/20 0822  BP: (!) 165/60 (!) 165/60 (!) 153/53 (!) 180/59  Pulse: 71 74 65 71  Resp:  18 18 16   Temp:  98.1 F (36.7 C) (!) 97.4 F (36.3 C) 97.9 F (36.6 C)  TempSrc:  Oral Oral   SpO2:  99% 92% 95%  Weight:      Height:        Intake/Output Summary (Last 24 hours) at 11/23/2020 0931 Last data filed at 11/22/2020 2030 Gross per 24 hour  Intake 240 ml  Output 60 ml  Net 180 ml   Physical Exam: A&Ox3, NAD CV: RRR Pulmonary: CTA Bilaterally Abdomen: Soft, Nontender, Nondistended Left groin: No drainage, swelling or ecchymosis noted. Vascular:             Right lower extremity: Thigh soft.  Calf soft.  Extremity is warm distally toes.  Good capillary refill.  Motor/sensory is intact.   Laboratory: CBC    Component Value Date/Time   WBC 12.6 (H) 11/23/2020 0608   HGB 8.7 (L) 11/23/2020 0608   HCT 25.2 (L) 11/23/2020 0608   PLT 139 (L) 11/23/2020 0608    BMET    Component Value Date/Time   NA 133 (L) 11/23/2020 0608   K 4.0 11/23/2020 0608   CL 97 (L) 11/23/2020 0608   CO2 27 11/23/2020 0608   GLUCOSE 116 (H) 11/23/2020 0608   BUN 27 (H) 11/23/2020 0608   BUN 11 01/14/2017 1110   CREATININE 1.15 (H) 11/23/2020 0608   CALCIUM 8.6 (L) 11/23/2020 0608   GFRNONAA 46 (L) 11/23/2020 0608   GFRAA 70 01/14/2017 1110   Assessment/Planning: The patient is an 87 year old female who presented with atherosclerotic disease with rest pain to the right lower extremity - POD#3  1) medical management with aspirin, Plavix and statin 2) doing well from a vascular standpoint can be discharged home when medically stable. 3) vascular surgery to sign off at this time.  Please reconsult if necessary.  Will see the patient in the outpatient setting.  Seen and examined with Dr. Ellis [facility] PA-C 11/23/2020 9:31 AM

## 2020-11-23 NOTE — Progress Notes (Addendum)
Progress Note    Yvonne Parrish  IPJ:[id number] DOB: 20-Mar-1929  DOA: 11/20/2020 PCP: Tracie Harrier, MD      Brief Narrative:    Medical records reviewed and are as summarized below:  Yvonne Parrish is a 87 y.o. female       Assessment/Plan:   Principal Problem:   PAD (peripheral artery disease) ([place]) Active Problems:   Acquired hypothyroidism   Benign essential HTN   Major depressive disorder, recurrent, in remission ([place])   Pure hypercholesterolemia   Chest pain   Hypotension   Body mass index is 24.43 kg/m.    Chest pain, elevated troponins in a patient with CAD s/p CABG in 1999: Elevated troponins probably from demand ischemia.  2D echo showed EF estimated at 40 to 45% and grade 1 diastolic dysfunction.  No further cardiac work-up per cardiologist.  Cardiologist has signed off.  Hypotension with presyncope: Resolved.  Monitor BP closely.  PVD s/p angioplasty of right anterior tibial artery and right popliteal artery on 11/21/2018: Continue aspirin, Plavix and rosuvastatin  Patient has been encouraged to drink lots of water to stay hydrated.  She and her daughter were told that routine urinalysis was not recommended in the absence of symptoms  Other comorbidities include CKD stage IIIa, chronic microcytic anemia, asthma, MDS, history of stroke, chronic diastolic and systolic CHF  Debility: PT and OT recommend discharge to SNF.  Awaiting placement to SNF.    Diet Order            DIET SOFT Room service appropriate? Yes; Fluid consistency: Thin  Diet effective now                    Consultants:  Cardiologist  Procedures:  Angioplasty of right anterior tibial artery and right popliteal artery on 11/20/2020    Medications:   . aspirin EC  81 mg Oral Daily  . clopidogrel  75 mg Oral Q breakfast  . FLUoxetine  40 mg Oral Daily  . levothyroxine  88 mcg Oral QAC breakfast  . metoprolol tartrate  12.5 mg Oral BID  . rosuvastatin  5 mg  Oral Once per day on Mon Wed Fri  . sodium chloride flush  3 mL Intravenous Q12H   Continuous Infusions: . sodium chloride       Anti-infectives (From admission, onward)   Start     Dose/Rate Route Frequency Ordered Stop   11/20/20 1121  ceFAZolin (ANCEF) 2-4 GM/100ML-% IVPB       Note to Pharmacy: Maynor, Erin   : cabinet override      11/20/20 1121 11/20/20 2329   11/20/20 0200  ceFAZolin (ANCEF) IVPB 2g/100 mL premix  Status:  Discontinued        2 g 200 mL/hr over 30 Minutes Intravenous  Once 11/20/20 0158 11/20/20 1950             Family Communication/Anticipated D/C date and plan/Code Status   DVT prophylaxis:      Code Status: DNR  Family Communication: Plan discussed with her daughter, Dario Guardian, at the bedside Disposition Plan:    Status is: Inpatient  Remains inpatient appropriate because:Unsafe d/c plan and Inpatient level of care appropriate due to severity of illness   Dispo: The patient is from: Home              Anticipated d/c is to: SNF              Patient currently is  not medically stable to d/c.   Difficult to place patient Yes           Subjective:   Interval events noted.  No new complaints today.  Her daughter was at the bedside and she was concerned about dark urine.  She admitted that patient has not been drinking enough fluids.  No vomiting, abdominal pain or any urinary symptoms.  Objective:    Vitals:   11/22/20 2035 11/23/20 0334 11/23/20 0822 11/23/20 1225  BP: (!) 165/60 (!) 153/53 (!) 180/59 (!) 145/49  Pulse: 74 65 71 66  Resp: 18 18 16 18   Temp: 98.1 F (36.7 C) (!) 97.4 F (36.3 C) 97.9 F (36.6 C) 97.9 F (36.6 C)  TempSrc: Oral Oral    SpO2: 99% 92% 95% 100%  Weight:      Height:       No data found.   Intake/Output Summary (Last 24 hours) at 11/23/2020 1622 Last data filed at 11/23/2020 1529 Gross per 24 hour  Intake 603 ml  Output 210 ml  Net 393 ml   Filed Weights   11/20/20 1139 11/22/20 0320   Weight: 59.4 kg 58.7 kg    Exam:  GEN: NAD SKIN: Warm and dry EYES: No pallor or icterus ENT: MMM CV: RRR PULM: CTA B ABD: soft, ND, NT, +BS CNS: AAO x 2 (person and place), non focal EXT: No edema or tenderness        Data Reviewed:   I have personally reviewed following labs and imaging studies:  Labs: Labs show the following:   Basic Metabolic Panel: Recent Labs  Lab 11/20/20 1213 11/20/20 1829 11/20/20 1829 11/21/20 0625 11/22/20 0403 11/23/20 0608  NA  --  136  --  136 132* 133*  K  --  3.4*   < > 3.3* 3.4* 4.0  CL  --  98  --  99 96* 97*  CO2  --  24  --  23 26 27   GLUCOSE  --  136*  --  129* 129* 116*  BUN 16 16  --  17 23 27*  CREATININE 1.07* 1.15*  --  0.95 1.03* 1.15*  CALCIUM  --  9.2  --  9.0 8.7* 8.6*  MG  --  1.7  --   --  1.8  --    < > = values in this interval not displayed.   GFR Estimated Creatinine Clearance: 27.3 mL/min (A) (by C-G formula based on SCr of 1.15 mg/dL (H)). Liver Function Tests: No results for input(s): AST, ALT, ALKPHOS, BILITOT, PROT, ALBUMIN in the last 168 hours. No results for input(s): LIPASE, AMYLASE in the last 168 hours. No results for input(s): AMMONIA in the last 168 hours. Coagulation profile No results for input(s): INR, PROTIME in the last 168 hours.  CBC: Recent Labs  Lab 11/20/20 1829 11/21/20 0625 11/22/20 0403 11/23/20 0608  WBC 15.6* 13.8* 14.0* 12.6*  NEUTROABS  --   --   --  9.0*  HGB 10.4* 10.5* 8.8* 8.7*  HCT 30.9* 30.5* 25.8* 25.2*  MCV 101.6* 99.7 100.4* 100.4*  PLT 180 165 139* 139*   Cardiac Enzymes: No results for input(s): CKTOTAL, CKMB, CKMBINDEX, TROPONINI in the last 168 hours. BNP (last 3 results) No results for input(s): PROBNP in the last 8760 hours. CBG: No results for input(s): GLUCAP in the last 168 hours. D-Dimer: No results for input(s): DDIMER in the last 72 hours. Hgb A1c: No results for input(s): HGBA1C in the  last 72 hours. Lipid Profile: No results for  input(s): CHOL, HDL, LDLCALC, TRIG, CHOLHDL, LDLDIRECT in the last 72 hours. Thyroid function studies: No results for input(s): TSH, T4TOTAL, T3FREE, THYROIDAB in the last 72 hours.  Invalid input(s): FREET3 Anemia work up: No results for input(s): VITAMINB12, FOLATE, FERRITIN, TIBC, IRON, RETICCTPCT in the last 72 hours. Sepsis Labs: Recent Labs  Lab 11/20/20 1829 11/21/20 0625 11/22/20 0403 11/23/20 0608  WBC 15.6* 13.8* 14.0* 12.6*    Microbiology Recent Results (from the past 240 hour(s))  SARS CORONAVIRUS 2 (TAT 6-24 HRS) Nasopharyngeal Nasopharyngeal Swab     Status: None   Collection Time: 11/16/20 12:13 PM   Specimen: Nasopharyngeal Swab  Result Value Ref Range Status   SARS Coronavirus 2 NEGATIVE NEGATIVE Final    Comment: (NOTE) SARS-CoV-2 target nucleic acids are NOT DETECTED.  The SARS-CoV-2 RNA is generally detectable in upper and lower respiratory specimens during the acute phase of infection. Negative results do not preclude SARS-CoV-2 infection, do not rule out co-infections with other pathogens, and should not be used as the sole basis for treatment or other patient management decisions. Negative results must be combined with clinical observations, patient history, and epidemiological information. The expected result is Negative.  Fact Sheet for Patients: SugarRoll.be  Fact Sheet for Healthcare Providers: https://www.woods-mathews.com/  This test is not yet approved or cleared by the [place] FDA and  has been authorized for detection and/or diagnosis of SARS-CoV-2 by FDA under an Emergency Use Authorization (EUA). This EUA will remain  in effect (meaning this test can be used) for the duration of the COVID-19 declaration under Se ction 564(b)(1) of the Act, 21 U.S.C. section 360bbb-3(b)(1), unless the authorization is terminated or revoked sooner.  Performed at [facility], [place] [address]  [place], [place] [address]     Procedures and diagnostic studies:  No results found.             LOS: 1 day   Swain Acree  [facility]   Pager on [email or web address]. If 7PM-7AM, please contact night-coverage at [email or web address]     11/23/2020, 4:22 PM

## 2020-11-23 NOTE — Progress Notes (Signed)
Patient has c/o difficulty urinating with the purewick and bladder scan showed >361 mL. Patient placed on bedpan to attempt to urinate. When rounding on patient after 10-15 minutes, patient was successful with urination and also had a BM. Purewick not replaced per patient preference and patient instructed to use her call bell when she needs to urinate again.  Earleen Reaper, RN

## 2020-11-24 DIAGNOSIS — I10 Essential (primary) hypertension: Secondary | ICD-10-CM

## 2020-11-24 LAB — CBC WITH DIFFERENTIAL/PLATELET
Abs Immature Granulocytes: 0.07 K/uL (ref 0.00–0.07)
Basophils Absolute: 0.1 K/uL (ref 0.0–0.1)
Basophils Relative: 1 %
Eosinophils Absolute: 0.4 K/uL (ref 0.0–0.5)
Eosinophils Relative: 4 %
HCT: 27.6 % — ABNORMAL LOW (ref 36.0–46.0)
Hemoglobin: 9.2 g/dL — ABNORMAL LOW (ref 12.0–15.0)
Immature Granulocytes: 1 %
Lymphocytes Relative: 12 %
Lymphs Abs: 1.2 K/uL (ref 0.7–4.0)
MCH: 33.8 pg (ref 26.0–34.0)
MCHC: 33.3 g/dL (ref 30.0–36.0)
MCV: 101.5 fL — ABNORMAL HIGH (ref 80.0–100.0)
Monocytes Absolute: 1.3 K/uL — ABNORMAL HIGH (ref 0.1–1.0)
Monocytes Relative: 13 %
Neutro Abs: 7.1 K/uL (ref 1.7–7.7)
Neutrophils Relative %: 69 %
Platelets: 151 K/uL (ref 150–400)
RBC: 2.72 MIL/uL — ABNORMAL LOW (ref 3.87–5.11)
RDW: 11.9 % (ref 11.5–15.5)
WBC: 10 K/uL (ref 4.0–10.5)
nRBC: 0 % (ref 0.0–0.2)

## 2020-11-24 LAB — BASIC METABOLIC PANEL WITH GFR
Anion gap: 10 (ref 5–15)
BUN: 30 mg/dL — ABNORMAL HIGH (ref 8–23)
CO2: 24 mmol/L (ref 22–32)
Calcium: 8.6 mg/dL — ABNORMAL LOW (ref 8.9–10.3)
Chloride: 100 mmol/L (ref 98–111)
Creatinine, Ser: 0.94 mg/dL (ref 0.44–1.00)
GFR, Estimated: 58 mL/min — ABNORMAL LOW
Glucose, Bld: 103 mg/dL — ABNORMAL HIGH (ref 70–99)
Potassium: 3.7 mmol/L (ref 3.5–5.1)
Sodium: 134 mmol/L — ABNORMAL LOW (ref 135–145)

## 2020-11-24 MED ORDER — LISINOPRIL 5 MG PO TABS
5.0000 mg | ORAL_TABLET | Freq: Every day | ORAL | Status: DC
  Administered 2020-11-24 – 2020-11-27 (×4): 5 mg via ORAL
  Filled 2020-11-24 (×4): qty 1

## 2020-11-24 NOTE — Progress Notes (Signed)
Physical Therapy Treatment Patient Details Name: Yvonne Parrish MRN: [id number] DOB: June 17, 1929 Today's Date: 11/24/2020    History of Present Illness Patient is 87 year old female admitted for angiography with stent of R LE. Was getting ready to discharge home and had syncope and chest pain. Therefore was admitted fo further work up.  S/P CABD, dyslipidemia, HTN. PMH: stroke, CHF, hypertension, hypothyroidism, MDS, peripheral artery disease    PT Comments    Patient alert, hesitant to work with PT due to feeling like it would be a lot of work but agreeable. The patient demonstrated improvement in mobility this session but still exhibited decreased mobility from PLOF including decreased activity tolerance. Sit <> stand with handheld assist and RW with CGA, several transfers performed to allow pt to void on BSC. Up in chair for rest break, and with breakfast arrival pt declined further mobility. Up in chair all needs in reach, breakfast set up assistance provided. The patient would benefit from further skilled PT intervention to continue to progress towards goals. Recommendation remains appropriate.       Follow Up Recommendations  SNF;Supervision/Assistance - 24 hour     Equipment Recommendations  None recommended by PT    Recommendations for Other Services       Precautions / Restrictions Precautions Precautions: Fall Restrictions Weight Bearing Restrictions: No    Mobility  Bed Mobility Overal bed mobility: Modified Independent             General bed mobility comments: increased time and effort only    Transfers Overall transfer level: Needs assistance Equipment used: Rolling walker (2 wheeled);1 person hand held assist Transfers: Sit to/from Stand Sit to Stand: Min guard         General transfer comment: CGA for two transfers  Ambulation/Gait Ambulation/Gait assistance: Min guard Gait Distance (Feet): 2 Feet Assistive device: Rolling walker (2 wheeled)        General Gait Details: stand pivoted to [facility] with handheld assist; able to take several steps to recliner in room with RW, mildly SOB noted. Further ambulation deferred due to pt wanting to eat breakfast   Stairs             Wheelchair Mobility    Modified Rankin (Stroke Patients Only)       Balance Overall balance assessment: Needs assistance Sitting-balance support: Feet supported Sitting balance-Leahy Scale: Good Sitting balance - Comments: Good sitting balance while reaching beyond BOS, able to perform pericare but assisted to ensure cleanliness after pt   Standing balance support: Bilateral upper extremity supported;During functional activity Standing balance-Leahy Scale: Fair Standing balance comment: reliant on RW                            Cognition Arousal/Alertness: Awake/alert Behavior During Therapy: WFL for tasks assessed/performed Overall Cognitive Status: History of cognitive impairments - at baseline                                 General Comments: Pt orieneted to self, place, and month/year.      Exercises      General Comments        Pertinent Vitals/Pain Pain Assessment: No/denies pain    Home Living                      Prior Function  PT Goals (current goals can now be found in the care plan section) Progress towards PT goals: Progressing toward goals    Frequency    Min 2X/week      PT Plan Current plan remains appropriate    Co-evaluation              AM-PAC PT "6 Clicks" Mobility   Outcome Measure  Help needed turning from your back to your side while in a flat bed without using bedrails?: A Little Help needed moving from lying on your back to sitting on the side of a flat bed without using bedrails?: A Little Help needed moving to and from a bed to a chair (including a wheelchair)?: A Little Help needed standing up from a chair using your arms (e.g., wheelchair or  bedside chair)?: A Little Help needed to walk in hospital room?: A Lot Help needed climbing 3-5 steps with a railing? : A Lot 6 Click Score: 16    End of Session Equipment Utilized During Treatment: Gait belt Activity Tolerance: Patient limited by fatigue Patient left: in chair;with call bell/phone within reach;with chair alarm set Nurse Communication: Mobility status PT Visit Diagnosis: Other abnormalities of gait and mobility (R26.89);Muscle weakness (generalized) (M62.81);History of falling (Z91.81);Difficulty in walking, not elsewhere classified (R26.2)     Time: 2446-9507 PT Time Calculation (min) (ACUTE ONLY): 23 min  Charges:  $Therapeutic Activity: 23-37 mins                     Lieutenant Diego PT, DPT 10:09 AM,11/24/20

## 2020-11-24 NOTE — Progress Notes (Addendum)
Progress Note    Yvonne Parrish  [id number]:[id number] DOB: 1928/12/10  DOA: 11/20/2020 PCP: Tracie Harrier, MD      Brief Narrative:    Medical records reviewed and are as summarized below:  Yvonne Parrish is a 87 y.o. female       Assessment/Plan:   Principal Problem:   PAD (peripheral artery disease) ([place]) Active Problems:   Acquired hypothyroidism   Benign essential HTN   Major depressive disorder, recurrent, in remission ([place])   Pure hypercholesterolemia   Chest pain   Hypotension   Body mass index is 26.36 kg/m.    Chest pain, elevated troponins in a patient with CAD s/p CABG in 1999: Elevated troponins probably from demand ischemia.  2D echo showed EF estimated at 40 to 45% and grade 1 diastolic dysfunction.  No further cardiac work-up per cardiologist.  Cardiologist has signed off.  Hypertension: Continue metoprolol.  Resume lisinopril because BP has been on the high side.  PVD s/p angioplasty of right anterior tibial artery and right popliteal artery on 11/21/2018: Continue aspirin, Plavix and rosuvastatin.  Her urine looked much clearer (amber in color) today.  Encourage adequate oral hydration.  Hypotension with presyncope: Resolved  Other comorbidities include CKD stage IIIa, chronic microcytic anemia, asthma, MDS, history of stroke, chronic diastolic and systolic CHF  Debility: PT and OT recommend discharge to SNF.  Awaiting placement to SNF.    Diet Order            DIET SOFT Room service appropriate? Yes; Fluid consistency: Thin  Diet effective now                    Consultants:  Cardiologist  Procedures:  Angioplasty of right anterior tibial artery and right popliteal artery on 11/20/2020    Medications:   . aspirin EC  81 mg Oral Daily  . clopidogrel  75 mg Oral Q breakfast  . FLUoxetine  40 mg Oral Daily  . levothyroxine  88 mcg Oral QAC breakfast  . metoprolol tartrate  12.5 mg Oral BID  . rosuvastatin  5 mg Oral Once  per day on Mon Wed Fri  . sodium chloride flush  3 mL Intravenous Q12H   Continuous Infusions: . sodium chloride       Anti-infectives (From admission, onward)   Start     Dose/Rate Route Frequency Ordered Stop   11/20/20 1121  ceFAZolin (ANCEF) 2-4 GM/100ML-% IVPB       Note to Pharmacy: Maynor, Erin   : cabinet override      11/20/20 1121 11/20/20 2329   11/20/20 0200  ceFAZolin (ANCEF) IVPB 2g/100 mL premix  Status:  Discontinued        2 g 200 mL/hr over 30 Minutes Intravenous  Once 11/20/20 0158 11/20/20 1950             Family Communication/Anticipated D/C date and plan/Code Status   DVT prophylaxis:      Code Status: DNR  Family Communication: None Disposition Plan:    Status is: Inpatient  Remains inpatient appropriate because:Unsafe d/c plan and Inpatient level of care appropriate due to severity of illness   Dispo: The patient is from: Home              Anticipated d/c is to: SNF              Patient currently is not medically stable to d/c.   Difficult to place patient Yes  Subjective:   No nausea, vomiting or abdominal pain.  She feels well.  Objective:    Vitals:   11/24/20 0556 11/24/20 0737 11/24/20 1119 11/24/20 1518  BP: (!) 167/59 112/60 (!) 181/57 (!) 159/58  Pulse: 66 67 65 74  Resp:  18 17 17   Temp: (!) 97.5 F (36.4 C) 97.6 F (36.4 C)  (!) 97.5 F (36.4 C)  TempSrc: Oral Oral  Oral  SpO2: 100% 99% 100% 100%  Weight:      Height:       No data found.   Intake/Output Summary (Last 24 hours) at 11/24/2020 1552 Last data filed at 11/24/2020 1516 Gross per 24 hour  Intake 960 ml  Output 200 ml  Net 760 ml   Filed Weights   11/20/20 1139 11/22/20 0320 11/24/20 0500  Weight: 59.4 kg 58.7 kg 63.3 kg    Exam:  GEN: NAD SKIN: No rash EYES: EOMI ENT: MMM CV: RRR PULM: CTA B ABD: soft, ND, NT, +BS CNS: AAO x 2 (person and place), non focal EXT: No edema or tenderness       Data Reviewed:    I have personally reviewed following labs and imaging studies:  Labs: Labs show the following:   Basic Metabolic Panel: Recent Labs  Lab 11/20/20 1829 11/21/20 0625 11/22/20 0403 11/23/20 0608 11/24/20 0827  NA 136 136 132* 133* 134*  K 3.4* 3.3* 3.4* 4.0 3.7  CL 98 99 96* 97* 100  CO2 24 23 26 27 24   GLUCOSE 136* 129* 129* 116* 103*  BUN 16 17 23  27* 30*  CREATININE 1.15* 0.95 1.03* 1.15* 0.94  CALCIUM 9.2 9.0 8.7* 8.6* 8.6*  MG 1.7  --  1.8  --   --    GFR Estimated Creatinine Clearance: 34.6 mL/min (by C-G formula based on SCr of 0.94 mg/dL). Liver Function Tests: No results for input(s): AST, ALT, ALKPHOS, BILITOT, PROT, ALBUMIN in the last 168 hours. No results for input(s): LIPASE, AMYLASE in the last 168 hours. No results for input(s): AMMONIA in the last 168 hours. Coagulation profile No results for input(s): INR, PROTIME in the last 168 hours.  CBC: Recent Labs  Lab 11/20/20 1829 11/21/20 0625 11/22/20 0403 11/23/20 0608 11/24/20 0827  WBC 15.6* 13.8* 14.0* 12.6* 10.0  NEUTROABS  --   --   --  9.0* 7.1  HGB 10.4* 10.5* 8.8* 8.7* 9.2*  HCT 30.9* 30.5* 25.8* 25.2* 27.6*  MCV 101.6* 99.7 100.4* 100.4* 101.5*  PLT 180 165 139* 139* 151   Cardiac Enzymes: No results for input(s): CKTOTAL, CKMB, CKMBINDEX, TROPONINI in the last 168 hours. BNP (last 3 results) No results for input(s): PROBNP in the last 8760 hours. CBG: No results for input(s): GLUCAP in the last 168 hours. D-Dimer: No results for input(s): DDIMER in the last 72 hours. Hgb A1c: No results for input(s): HGBA1C in the last 72 hours. Lipid Profile: No results for input(s): CHOL, HDL, LDLCALC, TRIG, CHOLHDL, LDLDIRECT in the last 72 hours. Thyroid function studies: No results for input(s): TSH, T4TOTAL, T3FREE, THYROIDAB in the last 72 hours.  Invalid input(s): FREET3 Anemia work up: No results for input(s): VITAMINB12, FOLATE, FERRITIN, TIBC, IRON, RETICCTPCT in the last 72  hours. Sepsis Labs: Recent Labs  Lab 11/21/20 0625 11/22/20 0403 11/23/20 0608 11/24/20 0827  WBC 13.8* 14.0* 12.6* 10.0    Microbiology Recent Results (from the past 240 hour(s))  SARS CORONAVIRUS 2 (TAT 6-24 HRS) Nasopharyngeal Nasopharyngeal Swab     Status:  None   Collection Time: 11/16/20 12:13 PM   Specimen: Nasopharyngeal Swab  Result Value Ref Range Status   SARS Coronavirus 2 NEGATIVE NEGATIVE Final    Comment: (NOTE) SARS-CoV-2 target nucleic acids are NOT DETECTED.  The SARS-CoV-2 RNA is generally detectable in upper and lower respiratory specimens during the acute phase of infection. Negative results do not preclude SARS-CoV-2 infection, do not rule out co-infections with other pathogens, and should not be used as the sole basis for treatment or other patient management decisions. Negative results must be combined with clinical observations, patient history, and epidemiological information. The expected result is Negative.  Fact Sheet for Patients: SugarRoll.be  Fact Sheet for Healthcare Providers: https://www.woods-mathews.com/  This test is not yet approved or cleared by the [place] FDA and  has been authorized for detection and/or diagnosis of SARS-CoV-2 by FDA under an Emergency Use Authorization (EUA). This EUA will remain  in effect (meaning this test can be used) for the duration of the COVID-19 declaration under Se ction 564(b)(1) of the Act, 21 U.S.C. section 360bbb-3(b)(1), unless the authorization is terminated or revoked sooner.  Performed at [facility], [place] [address] [place], [place] [address]     Procedures and diagnostic studies:  No results found.             LOS: 2 days   Davante Gerke  [facility]   Pager on [email or web address]. If 7PM-7AM, please contact night-coverage at [email or web address]     11/24/2020, 3:52 PM

## 2020-11-24 NOTE — TOC Progression Note (Signed)
Transition of Care [facility]) - Progression Note    Patient Details  Name: Yvonne Parrish MRN: [id number] Date of Birth: 12-25-1928  Transition of Care [facility]) CM/SW [place], RN Phone Number: 11/24/2020, 12:30 PM  Clinical Narrative:  Discussed with daughter, Elie Confer the SNF acceptance and her preference at this time is [facility] in [place]. Call [facility] spoke with Admissions Director [place], [phone] and she approved. Will submitt Authorization forms. Per Perrin Smack she can be admitted on Monday due to holiday weekend. Patient and daughter notified.    Expected Discharge Plan: Skilled Nursing Facility Barriers to Discharge: Continued Medical Work up  Expected Discharge Plan and Services Expected Discharge Plan: [place] Choice: Day   Expected Discharge Date: 11/20/20                                     Social Determinants of Health (SDOH) Interventions    Readmission Risk Interventions No flowsheet data found.

## 2020-11-24 NOTE — Care Management Important Message (Signed)
Important Message  Patient Details  Name: Yvonne Parrish MRN: [id number] Date of Birth: [birth year]   Medicare Important Message Given:  Yes     Dannette Barbara 11/24/2020, 12:31 PM

## 2020-11-25 MED ORDER — METAXALONE 800 MG PO TABS
400.0000 mg | ORAL_TABLET | Freq: Three times a day (TID) | ORAL | Status: DC
  Administered 2020-11-25 – 2020-11-27 (×5): 400 mg via ORAL
  Filled 2020-11-25 (×8): qty 0.5

## 2020-11-25 NOTE — Progress Notes (Signed)
Progress Note    Yvonne Parrish  [id number] DOB: [birth year]  DOA: 11/20/2020 PCP: Tracie Harrier, MD      Brief Narrative:    Medical records reviewed and are as summarized below:  Yvonne Parrish is a 87 y.o. female       Assessment/Plan:   Principal Problem:   PAD (peripheral artery disease) ([place]) Active Problems:   Acquired hypothyroidism   Benign essential HTN   Major depressive disorder, recurrent, in remission ([facility])   Pure hypercholesterolemia   Chest pain   Hypotension   Body mass index is 23.79 kg/m.    Chest pain, elevated troponins in a patient with CAD s/p CABG in 1999: Elevated troponins probably from demand ischemia.  2D echo showed EF estimated at 40 to 45% and grade 1 diastolic dysfunction.  No further cardiac work-up per cardiologist.  Cardiologist has signed off.  Hypertension: Continue metoprolol and lisinopril  PVD s/p angioplasty of right anterior tibial artery and right popliteal artery on 11/21/2018: Continue aspirin, Plavix and rosuvastatin.  Hypotension with presyncope: Resolved  Other comorbidities include CKD stage IIIa, chronic microcytic anemia, asthma, MDS, history of stroke, chronic diastolic and systolic CHF  Debility: PT and OT recommend discharge to SNF.    Transfer to [facility]. Awaiting placement to SNF.  Diet Order            DIET SOFT Room service appropriate? Yes; Fluid consistency: Thin  Diet effective now                    Consultants:  Cardiologist  Procedures:  Angioplasty of right anterior tibial artery and right popliteal artery on 11/20/2020    Medications:   . aspirin EC  81 mg Oral Daily  . clopidogrel  75 mg Oral Q breakfast  . FLUoxetine  40 mg Oral Daily  . levothyroxine  88 mcg Oral QAC breakfast  . lisinopril  5 mg Oral Daily  . metoprolol tartrate  12.5 mg Oral BID  . rosuvastatin  5 mg Oral Once per day on Mon Wed Fri  . sodium chloride flush  3 mL Intravenous Q12H    Continuous Infusions: . sodium chloride       Anti-infectives (From admission, onward)   Start     Dose/Rate Route Frequency Ordered Stop   11/20/20 1121  ceFAZolin (ANCEF) 2-4 GM/100ML-% IVPB       Note to Pharmacy: Maynor, Erin   : cabinet override      11/20/20 1121 11/20/20 2329   11/20/20 0200  ceFAZolin (ANCEF) IVPB 2g/100 mL premix  Status:  Discontinued        2 g 200 mL/hr over 30 Minutes Intravenous  Once 11/20/20 0158 11/20/20 1950             Family Communication/Anticipated D/C date and plan/Code Status   DVT prophylaxis:      Code Status: DNR  Family Communication: None Disposition Plan:    Status is: Inpatient  Remains inpatient appropriate because:Unsafe d/c plan and Inpatient level of care appropriate due to severity of illness   Dispo: The patient is from: Home              Anticipated d/c is to: SNF              Patient currently is not medically stable to d/c.   Difficult to place patient Yes           Subjective:   No  complaints.  No pain in the lower extremities, chest pain, shortness of breath, abdominal pain, vomiting  Objective:    Vitals:   11/25/20 0347 11/25/20 0441 11/25/20 0900 11/25/20 1142  BP: (!) 165/65 (!) 136/51 (!) 138/55 (!) 136/53  Pulse: 71  83 67  Resp: 20 (!) 23 17 17   Temp: 98.2 F (36.8 C)  97.8 F (36.6 C) 97.9 F (36.6 C)  TempSrc: Oral   Oral  SpO2: 98%  100% 98%  Weight: 57.1 kg     Height:       No data found.   Intake/Output Summary (Last 24 hours) at 11/25/2020 1320 Last data filed at 11/25/2020 1150 Gross per 24 hour  Intake 960 ml  Output 1103 ml  Net -143 ml   Filed Weights   11/22/20 0320 11/24/20 0500 11/25/20 0347  Weight: 58.7 kg 63.3 kg 57.1 kg    Exam:  GEN: NAD SKIN: No rash EYES: EOMI ENT: MMM CV: RRR PULM: CTA B ABD: soft, ND, NT, +BS CNS: AAO x 2 (person and place), non focal EXT: No edema or tenderness        Data Reviewed:   I have personally  reviewed following labs and imaging studies:  Labs: Labs show the following:   Basic Metabolic Panel: Recent Labs  Lab 11/20/20 1829 11/21/20 0625 11/22/20 0403 11/23/20 0608 11/24/20 0827  NA 136 136 132* 133* 134*  K 3.4* 3.3* 3.4* 4.0 3.7  CL 98 99 96* 97* 100  CO2 24 23 26 27 24   GLUCOSE 136* 129* 129* 116* 103*  BUN 16 17 23  27* 30*  CREATININE 1.15* 0.95 1.03* 1.15* 0.94  CALCIUM 9.2 9.0 8.7* 8.6* 8.6*  MG 1.7  --  1.8  --   --    GFR Estimated Creatinine Clearance: 30.6 mL/min (by C-G formula based on SCr of 0.94 mg/dL). Liver Function Tests: No results for input(s): AST, ALT, ALKPHOS, BILITOT, PROT, ALBUMIN in the last 168 hours. No results for input(s): LIPASE, AMYLASE in the last 168 hours. No results for input(s): AMMONIA in the last 168 hours. Coagulation profile No results for input(s): INR, PROTIME in the last 168 hours.  CBC: Recent Labs  Lab 11/20/20 1829 11/21/20 0625 11/22/20 0403 11/23/20 0608 11/24/20 0827  WBC 15.6* 13.8* 14.0* 12.6* 10.0  NEUTROABS  --   --   --  9.0* 7.1  HGB 10.4* 10.5* 8.8* 8.7* 9.2*  HCT 30.9* 30.5* 25.8* 25.2* 27.6*  MCV 101.6* 99.7 100.4* 100.4* 101.5*  PLT 180 165 139* 139* 151   Cardiac Enzymes: No results for input(s): CKTOTAL, CKMB, CKMBINDEX, TROPONINI in the last 168 hours. BNP (last 3 results) No results for input(s): PROBNP in the last 8760 hours. CBG: No results for input(s): GLUCAP in the last 168 hours. D-Dimer: No results for input(s): DDIMER in the last 72 hours. Hgb A1c: No results for input(s): HGBA1C in the last 72 hours. Lipid Profile: No results for input(s): CHOL, HDL, LDLCALC, TRIG, CHOLHDL, LDLDIRECT in the last 72 hours. Thyroid function studies: No results for input(s): TSH, T4TOTAL, T3FREE, THYROIDAB in the last 72 hours.  Invalid input(s): FREET3 Anemia work up: No results for input(s): VITAMINB12, FOLATE, FERRITIN, TIBC, IRON, RETICCTPCT in the last 72 hours. Sepsis Labs: Recent  Labs  Lab 11/21/20 0625 11/22/20 0403 11/23/20 0608 11/24/20 0827  WBC 13.8* 14.0* 12.6* 10.0    Microbiology Recent Results (from the past 240 hour(s))  SARS CORONAVIRUS 2 ([place] 6-24 HRS) Nasopharyngeal Nasopharyngeal Swab  Status: None   Collection Time: 11/16/20 12:13 PM   Specimen: Nasopharyngeal Swab  Result Value Ref Range Status   SARS Coronavirus 2 NEGATIVE NEGATIVE Final    Comment: (NOTE) SARS-CoV-2 target nucleic acids are NOT DETECTED.  The SARS-CoV-2 RNA is generally detectable in upper and lower respiratory specimens during the acute phase of infection. Negative results do not preclude SARS-CoV-2 infection, do not rule out co-infections with other pathogens, and should not be used as the sole basis for treatment or other patient management decisions. Negative results must be combined with clinical observations, patient history, and epidemiological information. The expected result is Negative.  Fact Sheet for Patients: SugarRoll.be  Fact Sheet for Healthcare Providers: https://www.woods-mathews.com/  This test is not yet approved or cleared by the [place] FDA and  has been authorized for detection and/or diagnosis of SARS-CoV-2 by FDA under an Emergency Use Authorization (EUA). This EUA will remain  in effect (meaning this test can be used) for the duration of the COVID-19 declaration under Se ction 564(b)(1) of the Act, 21 U.S.C. section 360bbb-3(b)(1), unless the authorization is terminated or revoked sooner.  Performed at [facility], [place] [address] [place], [place] [address]     Procedures and diagnostic studies:  No results found.             LOS: 3 days   Taunja Brickner  [facility]   Pager on [email or web address]. If 7PM-7AM, please contact night-coverage at [email or web address]     11/25/2020, 1:20 PM

## 2020-11-25 NOTE — Progress Notes (Signed)
Received report from [facility] on 2A, pt to transfer to 1C bed 115

## 2020-11-25 NOTE — Progress Notes (Addendum)
Mobility Specialist - Progress Note   11/25/20 1300  Mobility  Range of Motion/Exercises Right leg;Left leg (SLR, ABD, HS)  Level of Assistance Minimal assist, patient does 75% or more  Assistive Device Front wheel walker  Distance Ambulated (ft) 0 ft  Mobility Response Tolerated well  Mobility performed by Mobility specialist  $Mobility charge 1 Mobility    Pre-mobility: 68 HR, 98% SpO2 During mobility: 76 HR, 98% SpO2 Post-mobility: 69 HR, 97% SpO2   Pt c/o fatigue and "pain everywhere" on arrival 7/10. Pt hesitant about session, but agrees to bed exercises. Pt completed straight leg raises AROM x10 / PROM x10, abduction x10, and heel slides x10. Pt expressed difficulty engaging in PLB technique despite breathing cues. States that she does not notice when she's holding her breath during exercises. Voiced mild pain in B hip. Voiced SOB with sats maintaining high 90s throughout session. Family at bedside.    Kathee Delton Mobility Specialist 11/25/20, 1:33 PM

## 2020-11-25 NOTE — Plan of Care (Signed)
  Problem: Health Behavior/Discharge Planning: Goal: Ability to manage health-related needs will improve Outcome: Progressing   Problem: Clinical Measurements: Goal: Respiratory complications will improve Outcome: Progressing   Problem: Clinical Measurements: Goal: Cardiovascular complication will be avoided Outcome: Progressing   Problem: Pain Managment: Goal: General experience of comfort will improve Outcome: Progressing   Problem: Safety: Goal: Ability to remain free from injury will improve Outcome: Progressing   

## 2020-11-26 MED ORDER — HYDROCHLOROTHIAZIDE 25 MG PO TABS
25.0000 mg | ORAL_TABLET | Freq: Every day | ORAL | Status: DC
  Administered 2020-11-26 – 2020-11-27 (×2): 25 mg via ORAL
  Filled 2020-11-26 (×2): qty 1

## 2020-11-26 NOTE — Progress Notes (Signed)
Progress Note    Yvonne Parrish  WUX:[id number] DOB: 1929-03-07  DOA: 11/20/2020 PCP: Tracie Harrier, MD      Brief Narrative:    Medical records reviewed and are as summarized below:  Yvonne Parrish is a 87 y.o. female       Assessment/Plan:   Principal Problem:   PAD (peripheral artery disease) ([place]) Active Problems:   Acquired hypothyroidism   Benign essential HTN   Major depressive disorder, recurrent, in remission ([place])   Pure hypercholesterolemia   Chest pain   Hypotension   Body mass index is 23.79 kg/m.    Chest pain, elevated troponins in a patient with CAD s/p CABG in 1999: Elevated troponins probably from demand ischemia.  2D echo showed EF estimated at 40 to 45% and grade 1 diastolic dysfunction.  No further cardiac work-up per cardiologist.  Cardiologist has signed off.  Hypertension: BP is elevated.  Continue metoprolol and lisinopril.  Resume home hydrochlorothiazide.  PVD s/p angioplasty of right anterior tibial artery and right popliteal artery on 11/21/2018: Continue aspirin, Plavix and rosuvastatin.  Hypotension with presyncope: Resolved  Other comorbidities include CKD stage IIIa, chronic microcytic anemia, asthma, MDS, history of stroke, chronic diastolic and systolic CHF  Debility: PT and OT recommend discharge to SNF.    Awaiting placement to SNF.  Diet Order            DIET SOFT Room service appropriate? Yes; Fluid consistency: Thin  Diet effective now                    Consultants:  Cardiologist  Procedures:  Angioplasty of right anterior tibial artery and right popliteal artery on 11/20/2020    Medications:   . aspirin EC  81 mg Oral Daily  . clopidogrel  75 mg Oral Q breakfast  . FLUoxetine  40 mg Oral Daily  . levothyroxine  88 mcg Oral QAC breakfast  . lisinopril  5 mg Oral Daily  . metaxalone  400 mg Oral TID  . metoprolol tartrate  12.5 mg Oral BID  . rosuvastatin  5 mg Oral Once per day on Mon Wed  Fri  . sodium chloride flush  3 mL Intravenous Q12H   Continuous Infusions: . sodium chloride       Anti-infectives (From admission, onward)   Start     Dose/Rate Route Frequency Ordered Stop   11/20/20 1121  ceFAZolin (ANCEF) 2-4 GM/100ML-% IVPB       Note to Pharmacy: Maynor, Erin   : cabinet override      11/20/20 1121 11/20/20 2329   11/20/20 0200  ceFAZolin (ANCEF) IVPB 2g/100 mL premix  Status:  Discontinued        2 g 200 mL/hr over 30 Minutes Intravenous  Once 11/20/20 0158 11/20/20 1950             Family Communication/Anticipated D/C date and plan/Code Status   DVT prophylaxis:      Code Status: DNR  Family Communication: None Disposition Plan:    Status is: Inpatient  Remains inpatient appropriate because:Unsafe d/c plan and Inpatient level of care appropriate due to severity of illness   Dispo: The patient is from: Home              Anticipated d/c is to: SNF              Patient currently is not medically stable to d/c.   Difficult to place patient Yes  Subjective:   No complaints.  Interval events noted.  Objective:    Vitals:   11/26/20 0007 11/26/20 0449 11/26/20 0758 11/26/20 0800  BP: (!) 163/54 (!) 148/54 (!) 86/50 (!) 160/69  Pulse: 71 67 75   Resp: 16 16 14    Temp: 98.7 F (37.1 C) 98 F (36.7 C) 97.7 F (36.5 C)   TempSrc:      SpO2: 98% 99% 100%   Weight:      Height:       No data found.   Intake/Output Summary (Last 24 hours) at 11/26/2020 1130 Last data filed at 11/26/2020 1012 Gross per 24 hour  Intake 0 ml  Output 200 ml  Net -200 ml   Filed Weights   11/22/20 0320 11/24/20 0500 11/25/20 0347  Weight: 58.7 kg 63.3 kg 57.1 kg    Exam:  GEN: NAD SKIN: No rash EYES: EOMI ENT: MMM CV: RRR PULM: CTA B ABD: soft, ND, NT, +BS CNS: AAO x 2 (person and place), non focal EXT: No edema or tenderness        Data Reviewed:   I have personally reviewed following labs and imaging  studies:  Labs: Labs show the following:   Basic Metabolic Panel: Recent Labs  Lab 11/20/20 1829 11/21/20 0625 11/22/20 0403 11/23/20 0608 11/24/20 0827  NA 136 136 132* 133* 134*  K 3.4* 3.3* 3.4* 4.0 3.7  CL 98 99 96* 97* 100  CO2 24 23 26 27 24   GLUCOSE 136* 129* 129* 116* 103*  BUN 16 17 23  27* 30*  CREATININE 1.15* 0.95 1.03* 1.15* 0.94  CALCIUM 9.2 9.0 8.7* 8.6* 8.6*  MG 1.7  --  1.8  --   --    GFR Estimated Creatinine Clearance: 30.6 mL/min (by C-G formula based on SCr of 0.94 mg/dL). Liver Function Tests: No results for input(s): AST, ALT, ALKPHOS, BILITOT, PROT, ALBUMIN in the last 168 hours. No results for input(s): LIPASE, AMYLASE in the last 168 hours. No results for input(s): AMMONIA in the last 168 hours. Coagulation profile No results for input(s): INR, PROTIME in the last 168 hours.  CBC: Recent Labs  Lab 11/20/20 1829 11/21/20 0625 11/22/20 0403 11/23/20 0608 11/24/20 0827  WBC 15.6* 13.8* 14.0* 12.6* 10.0  NEUTROABS  --   --   --  9.0* 7.1  HGB 10.4* 10.5* 8.8* 8.7* 9.2*  HCT 30.9* 30.5* 25.8* 25.2* 27.6*  MCV 101.6* 99.7 100.4* 100.4* 101.5*  PLT 180 165 139* 139* 151   Cardiac Enzymes: No results for input(s): CKTOTAL, CKMB, CKMBINDEX, TROPONINI in the last 168 hours. BNP (last 3 results) No results for input(s): PROBNP in the last 8760 hours. CBG: No results for input(s): GLUCAP in the last 168 hours. D-Dimer: No results for input(s): DDIMER in the last 72 hours. Hgb A1c: No results for input(s): HGBA1C in the last 72 hours. Lipid Profile: No results for input(s): CHOL, HDL, LDLCALC, TRIG, CHOLHDL, LDLDIRECT in the last 72 hours. Thyroid function studies: No results for input(s): TSH, T4TOTAL, T3FREE, THYROIDAB in the last 72 hours.  Invalid input(s): FREET3 Anemia work up: No results for input(s): VITAMINB12, FOLATE, FERRITIN, TIBC, IRON, RETICCTPCT in the last 72 hours. Sepsis Labs: Recent Labs  Lab 11/21/20 0625  11/22/20 0403 11/23/20 0608 11/24/20 0827  WBC 13.8* 14.0* 12.6* 10.0    Microbiology Recent Results (from the past 240 hour(s))  SARS CORONAVIRUS 2 ([place] 6-24 HRS) Nasopharyngeal Nasopharyngeal Swab     Status: None   Collection  Time: 11/16/20 12:13 PM   Specimen: Nasopharyngeal Swab  Result Value Ref Range Status   SARS Coronavirus 2 NEGATIVE NEGATIVE Final    Comment: (NOTE) SARS-CoV-2 target nucleic acids are NOT DETECTED.  The SARS-CoV-2 RNA is generally detectable in upper and lower respiratory specimens during the acute phase of infection. Negative results do not preclude SARS-CoV-2 infection, do not rule out co-infections with other pathogens, and should not be used as the sole basis for treatment or other patient management decisions. Negative results must be combined with clinical observations, patient history, and epidemiological information. The expected result is Negative.  Fact Sheet for Patients: SugarRoll.be  Fact Sheet for Healthcare Providers: https://www.woods-mathews.com/  This test is not yet approved or cleared by the [place] FDA and  has been authorized for detection and/or diagnosis of SARS-CoV-2 by FDA under an Emergency Use Authorization (EUA). This EUA will remain  in effect (meaning this test can be used) for the duration of the COVID-19 declaration under Se ction 564(b)(1) of the Act, 21 U.S.C. section 360bbb-3(b)(1), unless the authorization is terminated or revoked sooner.  Performed at [facility], [place] [address] [place], [place] [address]     Procedures and diagnostic studies:  No results found.             LOS: 4 days   Tiajah Oyster  [facility]   Pager on [email or web address]. If 7PM-7AM, please contact night-coverage at [email or web address]     11/26/2020, 11:30 AM

## 2020-11-26 NOTE — Progress Notes (Signed)
Pt up to bsc with max assist to stand and barely pivot.  Pt back in bed safely but visibly and audibly short of breath for a couple minutes after getting back in bed.  Daughter now at bedside and stated this is patient's recent baseline.

## 2020-11-27 LAB — RESP PANEL BY RT-PCR (FLU A&B, COVID) ARPGX2
Influenza A by PCR: NEGATIVE
Influenza B by PCR: NEGATIVE
SARS Coronavirus 2 by RT PCR: NEGATIVE

## 2020-11-27 MED ORDER — CLOPIDOGREL BISULFATE 75 MG PO TABS: 75.0000 mg | ORAL_TABLET | Freq: Every day | ORAL | 0 refills | Status: DC

## 2020-11-27 MED ORDER — METOPROLOL SUCCINATE ER 25 MG PO TB24: 25.0000 mg | ORAL_TABLET | Freq: Every day | ORAL | Status: DC

## 2020-11-27 MED ORDER — ASPIRIN 81 MG PO TBEC: 81.0000 mg | DELAYED_RELEASE_TABLET | Freq: Every day | ORAL | Status: AC

## 2020-11-27 NOTE — TOC Progression Note (Signed)
Transition of Care [facility]) - Progression Note    Patient Details  Name: KAIRAH LEONI MRN: [id number] Date of Birth: July 12, 1929  Transition of Care [facility]) CM/SW Contact  Shelbie Hutching, RN Phone Number: 11/27/2020, 2:09 PM  Clinical Narrative:    EMS has been arranged.  EMS will try to go ahead and get her picked up, no other transports ahead of hers.    Expected Discharge Plan: Skilled Nursing Facility Barriers to Discharge: Barriers Resolved  Expected Discharge Plan and Services Expected Discharge Plan: [place] Choice: [facility]   Expected Discharge Date: 11/27/20               DME Arranged: N/A DME Agency: NA       HH Arranged: NA           Social Determinants of Health (SDOH) Interventions    Readmission Risk Interventions No flowsheet data found.

## 2020-11-27 NOTE — Care Management Important Message (Signed)
Important Message  Patient Details  Name: Yvonne Parrish MRN: [id number] Date of Birth: Mar 12, 1929   Medicare Important Message Given:  Yes     Juliann Pulse A Davie Sagona 11/27/2020, 1:02 PM

## 2020-11-27 NOTE — Progress Notes (Signed)
IV removed before discharge. Patient is going to [facility] room 215 in [place]. Called report to [facility] and spoke to [place]. Patient will be transferred via EMS.

## 2020-11-27 NOTE — Discharge Summary (Signed)
Physician Discharge Summary  MAYTHE DERAMO MCN:[id number] DOB: 1929-03-09 DOA: 11/20/2020  PCP: Tracie Harrier, MD  Admit date: 11/20/2020 Discharge date: 11/27/2020  Discharge disposition: Skilled nursing facility   Recommendations for Outpatient Follow-Up:   Follow-up with Dr. Lucky Cowboy, vascular surgeon, on Wednesday Dec 20, 2020. Follow-up with physician at the nursing home within 3 days of discharge   Discharge Diagnosis:   Principal Problem:   PAD (peripheral artery disease) ([place]) Active Problems:   Acquired hypothyroidism   Benign essential HTN   Major depressive disorder, recurrent, in remission ([place])   Pure hypercholesterolemia   Chest pain   Hypotension    Discharge Condition: Stable.  Diet recommendation:  Diet Order            Diet - low sodium heart healthy           DIET SOFT Room service appropriate? Yes; Fluid consistency: Thin  Diet effective now                   Code Status: DNR     Hospital Course:   Ms. Forrestine Lecrone is an 87 year old woman with medical history significant for chronic systolic and diastolic CHF, CKD stage III, history of stroke, hypertension, hypothyroidism, myelodysplastic syndrome, peripheral arterial disease, who was admitted to the hospital for elective angiogram of the right lower extremity with stent placement.  She was about to be discharged home after the procedure.  However, she developed dizziness, clammy skin, near syncopal episode and hypotension with blood pressure in the 80s.  She complained of shortness of breath and chest pain.    The hospitalist team was consulted to admit the patient for further management.  Troponins were mildly elevated but these were attributed to demand ischemia.  Blood pressure improved with IV fluids.  She was evaluated by the cardiologist and no further invasive cardiac work-up was recommended.  2D echo showed EF estimated at 40 to 45% and grade 1 diastolic dysfunction (similar to 2D echo  done in October 2018). She was evaluated by PT and OT who recommended further rehabilitation at the skilled nursing facility.  Her condition is improved.  She is deemed stable for discharge to SNF today.  Discharge plan was discussed with the patient and her daughter, Dario Guardian, at the bedside. .    Medical Consultants:    Cardiologist  Vascular surgeon   Discharge Exam:    Vitals:   11/27/20 0437 11/27/20 0857 11/27/20 0900 11/27/20 1240  BP: (!) 163/56 (!) 142/53 (!) 146/58 117/63  Pulse: 64 67  69  Resp:  18  16  Temp:  97.7 F (36.5 C)  97.8 F (36.6 C)  TempSrc:      SpO2: 99% 100%  97%  Weight:      Height:         GEN: NAD SKIN: Warm and dry EYES: No pallor or icterus ENT: MMM CV: RRR PULM: CTA B ABD: soft, ND, NT, +BS CNS: AAO x 2 (person and place), non focal EXT: No edema or tenderness   The results of significant diagnostics from this hospitalization (including imaging, microbiology, ancillary and laboratory) are listed below for reference.     Procedures and Diagnostic Studies:   ECHOCARDIOGRAM COMPLETE  Result Date: 11/22/2020    ECHOCARDIOGRAM REPORT   Patient Name:   [name] Date of Exam: 11/21/2020 Medical Rec #:  [id number]      Height:       61.0 in Accession #:  [id number]     Weight:       131.0 lb Date of Birth:  [birth year]       BSA:          1.578 m Patient Age:    87 years       BP:           160/69 mmHg Patient Gender: F              HR:           80 bpm. Exam Location:  [facility] Procedure: 2D Echo, Color Doppler and Cardiac Doppler Indications:     R07.9 Chest Pain  History:         Patient has prior history of Echocardiogram examinations. CHF,                  Prior CABG, Stroke, Signs/Symptoms:Murmur; Risk                  Factors:Hypertension.  Sonographer:     Charmayne Sheer RDCS (AE) Referring Phys:  [id number] Dessa Phi Diagnosing Phys: Isaias Cowman MD  Sonographer Comments: Suboptimal subcostal window. IMPRESSIONS  1. Left  ventricular ejection fraction, by estimation, is 40 to 45%. The left ventricle has mildly decreased function. The left ventricle has no regional wall motion abnormalities. There is mild left ventricular hypertrophy. Left ventricular diastolic parameters are consistent with Grade I diastolic dysfunction (impaired relaxation).  2. Right ventricular systolic function is normal. The right ventricular size is normal.  3. The mitral valve is normal in structure. Moderate mitral valve regurgitation. No evidence of mitral stenosis.  4. Tricuspid valve regurgitation is moderate.  5. The aortic valve is normal in structure. Aortic valve regurgitation is mild. Mild to moderate aortic valve stenosis.  6. The inferior vena cava is normal in size with greater than 50% respiratory variability, suggesting right atrial pressure of 3 mmHg. FINDINGS  Left Ventricle: Left ventricular ejection fraction, by estimation, is 40 to 45%. The left ventricle has mildly decreased function. The left ventricle has no regional wall motion abnormalities. The left ventricular internal cavity size was normal in size. There is mild left ventricular hypertrophy. Left ventricular diastolic parameters are consistent with Grade I diastolic dysfunction (impaired relaxation). Right Ventricle: The right ventricular size is normal. No increase in right ventricular wall thickness. Right ventricular systolic function is normal. Left Atrium: Left atrial size was normal in size. Right Atrium: Right atrial size was normal in size. Pericardium: There is no evidence of pericardial effusion. Mitral Valve: The mitral valve is normal in structure. Moderate mitral valve regurgitation. No evidence of mitral valve stenosis. MV peak gradient, 14.9 mmHg. The mean mitral valve gradient is 6.0 mmHg. Tricuspid Valve: The tricuspid valve is normal in structure. Tricuspid valve regurgitation is moderate . No evidence of tricuspid stenosis. Aortic Valve: The aortic valve is normal  in structure. Aortic valve regurgitation is mild. Aortic regurgitation PHT measures 323 msec. Mild to moderate aortic stenosis is present. Aortic valve mean gradient measures 11.0 mmHg. Aortic valve peak gradient measures 16.6 mmHg. Aortic valve area, by VTI measures 1.30 cm. Pulmonic Valve: The pulmonic valve was normal in structure. Pulmonic valve regurgitation is not visualized. No evidence of pulmonic stenosis. Aorta: The aortic root is normal in size and structure. Venous: The inferior vena cava is normal in size with greater than 50% respiratory variability, suggesting right atrial pressure of 3 mmHg. IAS/Shunts: No atrial level shunt detected by color flow Doppler.  LEFT  VENTRICLE PLAX 2D LVIDd:         4.30 cm  Diastology LVIDs:         3.60 cm  LV e' lateral:   6.64 cm/s LV PW:         1.00 cm  LV E/e' lateral: 17.0 LV IVS:        0.80 cm LVOT diam:     1.90 cm LV SV:         54 LV SV Index:   34 LVOT Area:     2.84 cm  LEFT ATRIUM             Index LA diam:        4.40 cm 2.79 cm/m LA Vol (A2C):   51.3 ml 32.51 ml/m LA Vol (A4C):   62.2 ml 39.42 ml/m LA Biplane Vol: 62.6 ml 39.67 ml/m  AORTIC VALVE                    PULMONIC VALVE AV Area (Vmax):    1.39 cm     PV Vmax:       1.22 m/s AV Area (Vmean):   1.28 cm     PV Vmean:      83.500 cm/s AV Area (VTI):     1.30 cm     PV VTI:        0.195 m AV Vmax:           204.00 cm/s  PV Peak grad:  6.0 mmHg AV Vmean:          157.000 cm/s PV Mean grad:  3.0 mmHg AV VTI:            0.418 m AV Peak Grad:      16.6 mmHg AV Mean Grad:      11.0 mmHg LVOT Vmax:         99.80 cm/s LVOT Vmean:        70.900 cm/s LVOT VTI:          0.191 m LVOT/AV VTI ratio: 0.46 AI PHT:            323 msec  AORTA Ao Root diam: 3.30 cm MITRAL VALVE MV Area (PHT): 3.43 cm     SHUNTS MV Area VTI:   1.38 cm     Systemic VTI:  0.19 m MV Peak grad:  14.9 mmHg    Systemic Diam: 1.90 cm MV Mean grad:  6.0 mmHg MV Vmax:       1.93 m/s MV Vmean:      114.0 cm/s MV Decel Time: 221 msec  MV E velocity: 113.00 cm/s MV A velocity: 161.00 cm/s MV E/A ratio:  0.70 Isaias Cowman MD Electronically signed by Isaias Cowman MD Signature Date/Time: 11/22/2020/8:41:29 AM    Final      Labs:   Basic Metabolic Panel: Recent Labs  Lab 11/20/20 1829 11/21/20 6644 11/22/20 0403 11/23/20 0608 11/24/20 0827  NA 136 136 132* 133* 134*  K 3.4* 3.3* 3.4* 4.0 3.7  CL 98 99 96* 97* 100  CO2 24 23 26 27 24   GLUCOSE 136* 129* 129* 116* 103*  BUN 16 17 23  27* 30*  CREATININE 1.15* 0.95 1.03* 1.15* 0.94  CALCIUM 9.2 9.0 8.7* 8.6* 8.6*  MG 1.7  --  1.8  --   --    GFR Estimated Creatinine Clearance: 30.6 mL/min (by C-G formula based on SCr of 0.94 mg/dL). Liver Function Tests: No results for input(s): AST,  ALT, ALKPHOS, BILITOT, PROT, ALBUMIN in the last 168 hours. No results for input(s): LIPASE, AMYLASE in the last 168 hours. No results for input(s): AMMONIA in the last 168 hours. Coagulation profile No results for input(s): INR, PROTIME in the last 168 hours.  CBC: Recent Labs  Lab 11/20/20 1829 11/21/20 0625 11/22/20 0403 11/23/20 0608 11/24/20 0827  WBC 15.6* 13.8* 14.0* 12.6* 10.0  NEUTROABS  --   --   --  9.0* 7.1  HGB 10.4* 10.5* 8.8* 8.7* 9.2*  HCT 30.9* 30.5* 25.8* 25.2* 27.6*  MCV 101.6* 99.7 100.4* 100.4* 101.5*  PLT 180 165 139* 139* 151   Cardiac Enzymes: No results for input(s): CKTOTAL, CKMB, CKMBINDEX, TROPONINI in the last 168 hours. BNP: Invalid input(s): POCBNP CBG: No results for input(s): GLUCAP in the last 168 hours. D-Dimer No results for input(s): DDIMER in the last 72 hours. Hgb A1c No results for input(s): HGBA1C in the last 72 hours. Lipid Profile No results for input(s): CHOL, HDL, LDLCALC, TRIG, CHOLHDL, LDLDIRECT in the last 72 hours. Thyroid function studies No results for input(s): TSH, T4TOTAL, T3FREE, THYROIDAB in the last 72 hours.  Invalid input(s): FREET3 Anemia work up No results for input(s): VITAMINB12, FOLATE,  FERRITIN, TIBC, IRON, RETICCTPCT in the last 72 hours. Microbiology Recent Results (from the past 240 hour(s))  Resp Panel by RT-PCR (Flu A&B, Covid) Nasopharyngeal Swab     Status: None   Collection Time: 11/27/20 10:46 AM   Specimen: Nasopharyngeal Swab; Nasopharyngeal(NP) swabs in vial transport medium  Result Value Ref Range Status   SARS Coronavirus 2 by RT PCR NEGATIVE NEGATIVE Final    Comment: (NOTE) SARS-CoV-2 target nucleic acids are NOT DETECTED.  The SARS-CoV-2 RNA is generally detectable in upper respiratory specimens during the acute phase of infection. The lowest concentration of SARS-CoV-2 viral copies this assay can detect is 138 copies/mL. A negative result does not preclude SARS-Cov-2 infection and should not be used as the sole basis for treatment or other patient management decisions. A negative result may occur with  improper specimen collection/handling, submission of specimen other than nasopharyngeal swab, presence of viral mutation(s) within the areas targeted by this assay, and inadequate number of viral copies(<138 copies/mL). A negative result must be combined with clinical observations, patient history, and epidemiological information. The expected result is Negative.  Fact Sheet for Patients:  EntrepreneurPulse.com.au  Fact Sheet for Healthcare Providers:  IncredibleEmployment.be  This test is no t yet approved or cleared by the [place] FDA and  has been authorized for detection and/or diagnosis of SARS-CoV-2 by FDA under an Emergency Use Authorization (EUA). This EUA will remain  in effect (meaning this test can be used) for the duration of the COVID-19 declaration under Section 564(b)(1) of the Act, 21 U.S.C.section 360bbb-3(b)(1), unless the authorization is terminated  or revoked sooner.       Influenza A by PCR NEGATIVE NEGATIVE Final   Influenza B by PCR NEGATIVE NEGATIVE Final    Comment:  (NOTE) The Xpert Xpress SARS-CoV-2/FLU/RSV plus assay is intended as an aid in the diagnosis of influenza from Nasopharyngeal swab specimens and should not be used as a sole basis for treatment. Nasal washings and aspirates are unacceptable for Xpert Xpress SARS-CoV-2/FLU/RSV testing.  Fact Sheet for Patients: EntrepreneurPulse.com.au  Fact Sheet for Healthcare Providers: IncredibleEmployment.be  This test is not yet approved or cleared by the [place] FDA and has been authorized for detection and/or diagnosis of SARS-CoV-2 by FDA under an Emergency Use Authorization (  EUA). This EUA will remain in effect (meaning this test can be used) for the duration of the COVID-19 declaration under Section 564(b)(1) of the Act, 21 U.S.C. section 360bbb-3(b)(1), unless the authorization is terminated or revoked.  Performed at [facility], [address], [place], [place] [address]      Discharge Instructions:   Discharge Instructions    Call MD for:  redness, tenderness, or signs of infection (pain, swelling, bleeding, redness, odor or green/yellow discharge around incision site)   Complete by: As directed    Call MD for:  severe or increased pain, loss or decreased feeling  in affected limb(s)   Complete by: As directed    Call MD for:  temperature >100.5   Complete by: As directed    Diet - low sodium heart healthy   Complete by: As directed    Driving Restrictions   Complete by: As directed    No driving for 24 hours   Increase activity slowly   Complete by: As directed    Lifting restrictions   Complete by: As directed    No lifting for 24 hours   No dressing needed   Complete by: As directed    Replace only if drainage present   Resume previous diet   Complete by: As directed      Allergies as of 11/27/2020      Reactions   Food [wheat Bran] Shortness Of Breath, Itching   Sweet potatoes and Wheat      Medication List     STOP taking these medications   aspirin 325 MG tablet Replaced by: aspirin 81 MG EC tablet   atorvastatin 20 MG tablet Commonly known as: LIPITOR   atorvastatin 40 MG tablet Commonly known as: LIPITOR   HYDROcodone-acetaminophen 5-325 MG tablet Commonly known as: NORCO/VICODIN   LORazepam 0.5 MG tablet Commonly known as: ATIVAN     TAKE these medications   albuterol 108 (90 Base) MCG/ACT inhaler Commonly known as: VENTOLIN HFA Inhale 1-2 puffs into the lungs every 4 (four) hours as needed for wheezing.   aspirin 81 MG EC tablet Take 1 tablet (81 mg total) by mouth daily. Swallow whole. Start taking on: November 28, 2020 Replaces: aspirin 325 MG tablet   azelastine 0.1 % nasal spray Commonly known as: ASTELIN Place 2 sprays into both nostrils daily as needed for rhinitis or allergies.   Calcium Carbonate-Vitamin D 600-400 MG-UNIT tablet Take 1 tablet by mouth daily.   clopidogrel 75 MG tablet Commonly known as: Plavix Take 1 tablet (75 mg total) by mouth daily.   famotidine 20 MG tablet Commonly known as: PEPCID Take 20 mg by mouth 2 (two) times daily.   Flovent HFA 44 MCG/ACT inhaler Generic drug: fluticasone Inhale 2 puffs into the lungs in the morning and at bedtime.   FLUoxetine 40 MG capsule Commonly known as: PROZAC Take 40 mg by mouth daily.   hydrochlorothiazide 25 MG tablet Commonly known as: HYDRODIURIL Take 25 mg by mouth every Monday, Wednesday, and Friday.   ipratropium 0.06 % nasal spray Commonly known as: ATROVENT Place into the nose.   levothyroxine 88 MCG tablet Commonly known as: SYNTHROID Take 88 mcg by mouth daily before breakfast.   lisinopril 5 MG tablet Commonly known as: ZESTRIL Take 5 mg by mouth daily.   loratadine 10 MG tablet Commonly known as: CLARITIN Take 10 mg by mouth daily.   metaxalone 800 MG tablet Commonly known as: SKELAXIN Take 400 mg by mouth  3 (three) times daily.   metoprolol succinate 25 MG 24 hr  tablet Commonly known as: TOPROL-XL Take 1 tablet (25 mg total) by mouth daily. What changed: how much to take   multivitamin with minerals Tabs tablet Take 1 tablet by mouth daily.   polyethylene glycol powder 17 GM/SCOOP powder Commonly known as: GLYCOLAX/MIRALAX Take 17 g by mouth daily.   potassium chloride SA 20 MEQ tablet Commonly known as: KLOR-CON Take 20 mEq by mouth daily.   rosuvastatin 5 MG tablet Commonly known as: CRESTOR Take 5 mg by mouth every Monday, Wednesday, and Friday.       Contact information for follow-up providers    Dew, Erskine Squibb, MD Follow up in 4 week(s).   Specialties: Vascular Surgery, Radiology, Interventional Cardiology Why: with ABIs and renal duplex Wednesday May 11th at 9:30  Contact information: [place] [address] [phone]            Contact information for after-discharge care    Destination    [facility] .   Service: Skilled Nursing Contact information: [address] [place] [id number]                   Time coordinating discharge: 32 minutes  Signed:  Jennye Boroughs  [facility] 11/27/2020, 12:46 PM   Pager on [email or web address]. If 7PM-7AM, please contact night-coverage at [email or web address]

## 2020-11-27 NOTE — TOC Transition Note (Signed)
Transition of Care [facility]) - CM/SW Discharge Note   Patient Details  Name: Yvonne Parrish MRN: [id number] Date of Birth: [birth year]  Transition of Care [facility]) CM/SW Contact:  Shelbie Hutching, RN Phone Number: 11/27/2020, 10:47 AM   Clinical Narrative:    Insurance authorization received for SNF.  [id number] is auth ID. Reference # [id number].  Patient approved from 4/16- 4/20.  [facility] does require a COVID screen test before admitting today.  Rapid COVID ordered.  Once discharge summary and COVID are in [place] [facility] [place] will provide room number at facility and transport can be arranged.    Final next level of care: Skilled Nursing Facility Barriers to Discharge: Barriers Resolved   Patient Goals and CMS Choice Patient states their goals for this hospitalization and ongoing recovery are:: To SNF CMS Medicare.gov Compare Post Acute Care list provided to:: Patient Represenative (must comment) Choice offered to / list presented to : Adult Children  Discharge Placement              Patient chooses bed at: [facility] Patient to be transferred to facility by: EMS Name of family member notified: Neoma Laming Patient and family notified of of transfer: 11/27/20  Discharge Plan and Services     Post Acute Care Choice: [facility]          DME Arranged: N/A DME Agency: NA       HH Arranged: NA          Social Determinants of Health (SDOH) Interventions     Readmission Risk Interventions No flowsheet data found.

## 2020-11-28 ENCOUNTER — Encounter: Payer: Self-pay | Admitting: Internal Medicine

## 2020-11-28 ENCOUNTER — Non-Acute Institutional Stay (SKILLED_NURSING_FACILITY): Payer: Medicare Other | Admitting: Internal Medicine

## 2020-11-28 DIAGNOSIS — N1831 Chronic kidney disease, stage 3a: Secondary | ICD-10-CM | POA: Diagnosis not present

## 2020-11-28 DIAGNOSIS — D539 Nutritional anemia, unspecified: Secondary | ICD-10-CM

## 2020-11-28 DIAGNOSIS — I9581 Postprocedural hypotension: Secondary | ICD-10-CM | POA: Diagnosis not present

## 2020-11-28 DIAGNOSIS — N183 Chronic kidney disease, stage 3 unspecified: Secondary | ICD-10-CM | POA: Insufficient documentation

## 2020-11-28 DIAGNOSIS — I739 Peripheral vascular disease, unspecified: Secondary | ICD-10-CM | POA: Diagnosis not present

## 2020-11-28 DIAGNOSIS — R29818 Other symptoms and signs involving the nervous system: Secondary | ICD-10-CM | POA: Insufficient documentation

## 2020-11-28 DIAGNOSIS — R4189 Other symptoms and signs involving cognitive functions and awareness: Secondary | ICD-10-CM

## 2020-11-28 NOTE — Assessment & Plan Note (Addendum)
H/H 9.2/27.6; MCV > 100. No B12 in Epic; this will be updated

## 2020-11-28 NOTE — Assessment & Plan Note (Signed)
Blood pressure presently controlled.

## 2020-11-28 NOTE — Assessment & Plan Note (Signed)
Vascular surgery follow-up with Dr. Lucky Cowboy  12/20/2020.

## 2020-11-28 NOTE — Patient Instructions (Signed)
See assessment and plan under each diagnosis in the problem list and acutely for this visit 

## 2020-11-28 NOTE — Progress Notes (Signed)
NURSING HOME LOCATION:  [facility]  ROOM NUMBER:  [id number]  CODE STATUS:  DNR  PCP:  Tracie Harrier, MD  This is a comprehensive admission note to this SNFperformed on this date less than 30 days from date of admission. Included are preadmission medical/surgical history; reconciled medication list; family history; social history and comprehensive review of systems.  Corrections and additions to the records were documented. Comprehensive physical exam was also performed. Additionally a clinical summary was entered for each active diagnosis pertinent to this admission in the Problem List to enhance continuity of care.  HPI: Patient was hospitalized 4/11 - 11/27/2020 admitted for elective angiogram of the right lower extremity with stent placement.  Postop she developed dizziness, diaphoresis, and near syncope with blood pressures in the 80s. She had associated dyspnea and chest pain. Troponins were mildly elevated but this was attributed to demand ischemia.  Blood pressure improved with IV fluids.  Cardiology felt no further invasive cardiac work-up was indicated.  2D echo revealed an EF of 40-45% and grade 1 diastolic dysfunction, similar to echo findings in October 2018. PT and OT recommended further rehab at the SNF. Vascular surgery follow-up was to be with Dr. Lucky Cowboy to 12/20/2020.  Past medical and surgical history: Includes essential hypertension, history of stroke, myelodysplastic syndrome, hypothyroidism, dyslipidemia, GERD,CKD  and history of congestive heart failure. Surgeries and procedures include coronary angioplasty, CABG, knee arthroscopy, and lumbar spine surgery.  Social history: Never smoked; nondrinker.  Family history: Noncontributory due to advanced age of 26.   Review of systems: Clinical neurocognitive deficits made validity of responses questionable . She could not tell me why she was in the hospital stating "not quite sure".  She was unaware  of any surgical procedures.  Despite this she was able to give me the date as "April ?,  2022" and name the president but her responses were markedly delayed. She states that she is tired.  She describes soreness around her ankles.  She has chronic shortness of breath which she relates to her asthma.  She also has chronic constipation. She has no other positive review of systems.  Constitutional: No fever, significant weight change  Eyes: No redness, discharge, pain, vision change ENT/mouth: No nasal congestion, purulent discharge, earache, change in hearing, sore throat  Cardiovascular: No chest pain, palpitations, paroxysmal nocturnal dyspnea, claudication, edema  Respiratory: No cough, sputum production, hemoptysis,  significant snoring, apnea  Gastrointestinal: No heartburn, dysphagia, abdominal pain, nausea /vomiting, rectal bleeding, melena Genitourinary: No dysuria, hematuria, pyuria, incontinence, nocturia Dermatologic: No rash, pruritus, change in appearance of skin Neurologic: No dizziness, headache, syncope, seizures, numbness, tingling Psychiatric: No significant anxiety, depression, insomnia, anorexia Endocrine: No change in hair/skin/nails, excessive thirst, excessive hunger, excessive urination  Hematologic/lymphatic: No significant bruising, lymphadenopathy, abnormal bleeding Allergy/immunology: No itchy/watery eyes, significant sneezing, urticaria, angioedema  Physical exam:  Pertinent or positive findings: She appears her stated age.  She is hard of hearing.  When asked a question she has a puzzled demeanor before attempting to rspond.  She also tends to pause and gasp before answering questions.  She was holding & petting a battery-powered mechanical cat on her lap.  The cat was surprisingly life- like, moving its head and meowing.  She stated that its name was "Jonni Sanger". Eyebrows are essentially absent.  She has a grade 1.5 systolic murmur at the right base.  This second heart  sound is increased.  She has diffuse low-grade rales, greatest at the left lower  lobe.  Pedal pulses are decreased.  She has limb atrophy as well as interosseous wasting.  General appearance: no acute distress, increased work of breathing is present.   Lymphatic: No lymphadenopathy about the head, neck, axilla. Eyes: No conjunctival inflammation or lid edema is present. There is no scleral icterus. Ears:  External ear exam shows no significant lesions or deformities.   Nose:  External nasal examination shows no deformity or inflammation. Nasal mucosa are pink and moist without lesions, exudates Oral exam: Lips and gums are healthy appearing.There is no oropharyngeal erythema or exudate. Neck:  No thyromegaly, masses, tenderness noted.    Heart:  Normal rate and regular rhythm. S1  normal without gallop, click, rub.  Lungs:  without wheezes, rhonchi,  rubs. Abdomen: Bowel sounds are normal.  Abdomen is soft and nontender with no organomegaly, hernias, masses. GU: Deferred  Extremities:  No cyanosis, clubbing, edema. Neurologic exam: Balance, Rhomberg, finger to nose testing could not be completed due to clinical state Skin: Warm & dry w/o tenting. No significant lesions or rash.  See clinical summary under each active problem in the Problem List with associated updated therapeutic plan

## 2020-11-28 NOTE — Assessment & Plan Note (Addendum)
See 11/28/2020: patient was able to give me the correct month and year but not the day.  She also named the president but her responses were markedly delayed.  She was unable to tell me why she had been in the hospital and did not realize she had a surgical procedure.  She seemed to think the mechanical cat she was petting was real. Copy notes to [place]

## 2020-11-28 NOTE — Assessment & Plan Note (Signed)
11/24/2020 creatinine 0.94/GFR 58.  Avoid nephrotoxic drugs.

## 2020-11-29 LAB — VITAMIN B12: Vitamin B-12: 917

## 2020-11-29 LAB — TSH: TSH: 0.74 (ref 0.41–5.90)

## 2020-12-05 ENCOUNTER — Non-Acute Institutional Stay (SKILLED_NURSING_FACILITY): Payer: Medicare Other | Admitting: Adult Health

## 2020-12-05 ENCOUNTER — Encounter: Payer: Self-pay | Admitting: Adult Health

## 2020-12-05 DIAGNOSIS — F3340 Major depressive disorder, recurrent, in remission, unspecified: Secondary | ICD-10-CM

## 2020-12-05 DIAGNOSIS — I10 Essential (primary) hypertension: Secondary | ICD-10-CM

## 2020-12-05 DIAGNOSIS — I739 Peripheral vascular disease, unspecified: Secondary | ICD-10-CM

## 2020-12-05 DIAGNOSIS — E039 Hypothyroidism, unspecified: Secondary | ICD-10-CM

## 2020-12-05 NOTE — Progress Notes (Signed)
Location:  [facility] Room Number: [id number] Place of Service:  SNF (31) Provider:  Durenda Age, DNP, FNP-BC  Patient Care Team: Tracie Harrier, MD as PCP - General (Internal Medicine)  Extended Emergency Contact Information Primary Emergency Contact: [facility] Address: [address]          [place], [place] [address] Home Phone: [phone] Relation: Daughter Secondary Emergency Contact: Kellie Simmering [place] Phone: [phone] Relation: Daughter  Code Status:   DNR  Goals of care: Advanced Directive information Advanced Directives 11/28/2020  Does Patient Have a Medical Advance Directive? Yes  Type of Paramedic of [place];Living will  Does patient want to make changes to medical advance directive? No - Patient declined  Copy of [place] in Chart? Yes - validated most recent copy scanned in chart (See row information)  Pre-existing out of facility DNR order (yellow form or pink MOST form) -     Chief Complaint  Patient presents with  . Acute Visit    Short-term rehabilitation    HPI:  Pt is a 87 y.o. female seen today for medical management of chronic diseases.  She is a short-term care resident of [facility]. She has a PMH of chronic systolic and diastolic CHF, CKD stage III, history of stroke, hypertension, hypothyroidism, myelodysplastic syndrome and peripheral arterial disease. She was admitted to [facility] on 11/27/20 post hospitalization 11/20/20 to 11/27/20.  She had an elective angiogram of the right lower extremity with stent placement on 11/20/2020.  She was about to be discharged home after the procedure but she developed dizziness, clammy skin, near syncopal episode and hypotension with blood pressure in the 80s.  She complained of shortness of breath and chest pain.  Hospitalist was consulted and thought that  troponins were mildly elevated but these were attributed to demand ischemia.  She was given IV fluids which improved blood pressures.  Cardiology evaluation done and no further invasive cardiac work-up was recommended.  She was seen in her room today. No noted crying today. She takes fluoxetine 40 mg daily for depression.  SBPs ranging from 106-144.  She takes hydrochlorothiazide 25 mg on MWF, lisinopril 5 mg daily and metoprolol succinate ER 25 mg daily for hypertension.    Past Medical History:  Diagnosis Date  . Arthritis   . Asthma in child    question of interstitial lung disease  . CHF (congestive heart failure) ([place])   . Chronic back pain   . Chronic pain of right knee    for past 30 years  . Heart murmur   . Heartburn   . History of right foot drop   . Hypertension   . Hypothyroidism   . Myelodysplastic syndrome ([place])   . Stroke (cerebrum) ([place]) 08/2016   Past Surgical History:  Procedure Laterality Date  . CORONARY ANGIOPLASTY    . CORONARY ARTERY BYPASS GRAFT  1999  . KNEE ARTHROSCOPY    . knee fracture     Right knee pinning in '90's   . LOWER EXTREMITY ANGIOGRAPHY Right 11/20/2020   Procedure: LOWER EXTREMITY ANGIOGRAPHY;  Surgeon: Algernon Huxley, MD;  Location: [facility];  Service: Cardiovascular;  Laterality: Right;  . LUMBAR SPINE SURGERY    . TONSILLECTOMY      Allergies  Allergen Reactions  . Food [Wheat Bran] Shortness Of Breath and Itching    Sweet potatoes and Wheat    Outpatient  Encounter Medications as of 12/05/2020  Medication Sig  . albuterol (PROVENTIL HFA;VENTOLIN HFA) 108 (90 Base) MCG/ACT inhaler Inhale 2 puffs into the lungs every 4 (four) hours as needed for wheezing.  Marland Kitchen aspirin EC 81 MG EC tablet Take 1 tablet (81 mg total) by mouth daily. Swallow whole.  Marland Kitchen azelastine (ASTELIN) 0.1 % nasal spray Place 2 sprays into both nostrils daily as needed for rhinitis or allergies.   . Calcium Carbonate-Vitamin D 600-400 MG-UNIT tablet Take 1  tablet by mouth daily.  . clopidogrel (PLAVIX) 75 MG tablet Take 1 tablet (75 mg total) by mouth daily.  . famotidine (PEPCID) 20 MG tablet Take 20 mg by mouth 2 (two) times daily.  Marland Kitchen FLOVENT HFA 44 MCG/ACT inhaler Inhale 2 puffs into the lungs in the morning and at bedtime.  Marland Kitchen FLUoxetine (PROZAC) 40 MG capsule Take 40 mg by mouth daily.  . hydrochlorothiazide (HYDRODIURIL) 25 MG tablet Take 25 mg by mouth every Monday, Wednesday, and Friday.  Marland Kitchen ipratropium (ATROVENT) 0.06 % nasal spray Place into the nose.  . levothyroxine (SYNTHROID, LEVOTHROID) 88 MCG tablet Take 88 mcg by mouth daily before breakfast.  . lisinopril (ZESTRIL) 5 MG tablet Take 5 mg by mouth daily.  Marland Kitchen loratadine (CLARITIN) 10 MG tablet Take 10 mg by mouth daily.  . metaxalone (SKELAXIN) 800 MG tablet Take 400 mg by mouth 3 (three) times daily.  . metoprolol succinate (TOPROL-XL) 25 MG 24 hr tablet Take 1 tablet (25 mg total) by mouth daily.  . Multiple Vitamin (MULTIVITAMIN WITH MINERALS) TABS tablet Take 1 tablet by mouth daily.  . polyethylene glycol powder (GLYCOLAX/MIRALAX) powder Take 17 g by mouth daily.  . potassium chloride SA (KLOR-CON) 20 MEQ tablet Take 20 mEq by mouth daily.  . rosuvastatin (CRESTOR) 5 MG tablet Take 5 mg by mouth every Monday, Wednesday, and Friday.   No facility-administered encounter medications on file as of 12/05/2020.    Review of Systems  GENERAL: No change in appetite, no fatigue, no weight changes, no fever or chills  MOUTH and THROAT: Denies oral discomfort, gingival pain or bleeding RESPIRATORY: no cough, SOB, DOE, wheezing, hemoptysis CARDIAC: No chest pain, edema or palpitations GI: No abdominal pain, diarrhea, constipation, heart burn, nausea or vomiting NEUROLOGICAL: Denies dizziness, syncope, numbness, or headache PSYCHIATRIC: Denies feelings of depression or anxiety. No report of hallucinations, insomnia, paranoia, or agitation    Immunization History  Administered  Date(s) Administered  . Influenza Split 07/05/2014  . Influenza, High Dose Seasonal PF 07/02/2018  . Influenza-Unspecified 04/08/2016, 05/26/2017, 07/02/2018  . PFIZER(Purple Top)SARS-COV-2 Vaccination 11/27/2019, 12/21/2019  . Pneumococcal Conjugate-13 08/28/2015  . Zoster 04/26/2013   Pertinent  Health Maintenance Due  Topic Date Due  . DEXA SCAN  Never done  . PNA vac Low Risk Adult (2 of 2 - PPSV23) 08/27/2016  . INFLUENZA VACCINE  03/12/2021   Fall Risk  10/01/2016  Falls in the past year? No     Vitals:   12/05/20 1000  BP: 137/63  Pulse: 61  Resp: 17  Temp: (!) 97.5 F (36.4 C)  Weight: 124 lb 6.4 oz (56.4 kg)  Height: 5\' 3"  (1.6 m)   Body mass index is 22.04 kg/m.  Physical Exam  GENERAL APPEARANCE: Well nourished. In no acute distress. Normal body habitus SKIN:  Skin is warm and dry.  MOUTH and THROAT: Lips are without lesions. Oral mucosa is moist and without lesions.  RESPIRATORY: Breathing is even & unlabored, BS CTAB CARDIAC: RRR, no  murmur,no extra heart sounds, no edema GI: Abdomen soft, normal BS, no masses, no tenderness NEUROLOGICAL: There is no tremor. Speech is clear. Alert to self and time, disoriented to place. PSYCHIATRIC:  Affect and behavior are appropriate  Labs reviewed: Recent Labs    11/20/20 1829 11/21/20 0625 11/22/20 0403 11/23/20 0608 11/24/20 0827  NA 136   < > 132* 133* 134*  K 3.4*   < > 3.4* 4.0 3.7  CL 98   < > 96* 97* 100  CO2 24   < > 26 27 24   GLUCOSE 136*   < > 129* 116* 103*  BUN 16   < > 23 27* 30*  CREATININE 1.15*   < > 1.03* 1.15* 0.94  CALCIUM 9.2   < > 8.7* 8.6* 8.6*  MG 1.7  --  1.8  --   --    < > = values in this interval not displayed.    Recent Labs    11/22/20 0403 11/23/20 0608 11/24/20 0827  WBC 14.0* 12.6* 10.0  NEUTROABS  --  9.0* 7.1  HGB 8.8* 8.7* 9.2*  HCT 25.8* 25.2* 27.6*  MCV 100.4* 100.4* 101.5*  PLT 139* 139* 151    Lab Results  Component Value Date   HGBA1C 6.0 (H)  08/21/2016   Lab Results  Component Value Date   CHOL 168 08/21/2016   HDL 57 08/21/2016   LDLCALC 94 08/21/2016   TRIG 87 08/21/2016   CHOLHDL 2.9 08/21/2016    Significant Diagnostic Results in last 30 days:  DG Chest 2 View  Result Date: 11/20/2020 CLINICAL DATA:  Shortness of breath EXAM: CHEST - 2 VIEW COMPARISON:  12/31/2017 FINDINGS: Post sternotomy changes. Linear atelectasis or scar at the left base. No focal consolidation or effusion. Stable cardiomediastinal silhouette with aortic atherosclerosis. No pneumothorax. Possible calcified loose bodies at the right shoulder. IMPRESSION: No active cardiopulmonary disease. Linear atelectasis or scar at the left base. Electronically Signed   By: Donavan Foil M.D.   On: 11/20/2020 19:01   PERIPHERAL VASCULAR CATHETERIZATION  Result Date: 11/20/2020 See op note  ECHOCARDIOGRAM COMPLETE  Result Date: 11/22/2020    ECHOCARDIOGRAM REPORT   Patient Name:   [name] Date of Exam: 11/21/2020 Medical Rec #:  [id number]      Height:       61.0 in Accession #:    [id number]     Weight:       131.0 lb Date of Birth:  1929/03/28       BSA:          1.578 m Patient Age:    87 years       BP:           160/69 mmHg Patient Gender: F              HR:           80 bpm. Exam Location:  [facility] Procedure: 2D Echo, Color Doppler and Cardiac Doppler Indications:     R07.9 Chest Pain  History:         Patient has prior history of Echocardiogram examinations. CHF,                  Prior CABG, Stroke, Signs/Symptoms:Murmur; Risk                  Factors:Hypertension.  Sonographer:     Charmayne Sheer RDCS (AE) Referring Phys:  [id number] [facility] Diagnosing Phys: Isaias Cowman MD  Sonographer Comments: Suboptimal subcostal window. IMPRESSIONS  1. Left ventricular ejection fraction, by estimation, is 40 to 45%. The left ventricle has mildly decreased function. The left ventricle has no regional wall motion abnormalities. There is mild left ventricular hypertrophy.  Left ventricular diastolic parameters are consistent with Grade I diastolic dysfunction (impaired relaxation).  2. Right ventricular systolic function is normal. The right ventricular size is normal.  3. The mitral valve is normal in structure. Moderate mitral valve regurgitation. No evidence of mitral stenosis.  4. Tricuspid valve regurgitation is moderate.  5. The aortic valve is normal in structure. Aortic valve regurgitation is mild. Mild to moderate aortic valve stenosis.  6. The inferior vena cava is normal in size with greater than 50% respiratory variability, suggesting right atrial pressure of 3 mmHg. FINDINGS  Left Ventricle: Left ventricular ejection fraction, by estimation, is 40 to 45%. The left ventricle has mildly decreased function. The left ventricle has no regional wall motion abnormalities. The left ventricular internal cavity size was normal in size. There is mild left ventricular hypertrophy. Left ventricular diastolic parameters are consistent with Grade I diastolic dysfunction (impaired relaxation). Right Ventricle: The right ventricular size is normal. No increase in right ventricular wall thickness. Right ventricular systolic function is normal. Left Atrium: Left atrial size was normal in size. Right Atrium: Right atrial size was normal in size. Pericardium: There is no evidence of pericardial effusion. Mitral Valve: The mitral valve is normal in structure. Moderate mitral valve regurgitation. No evidence of mitral valve stenosis. MV peak gradient, 14.9 mmHg. The mean mitral valve gradient is 6.0 mmHg. Tricuspid Valve: The tricuspid valve is normal in structure. Tricuspid valve regurgitation is moderate . No evidence of tricuspid stenosis. Aortic Valve: The aortic valve is normal in structure. Aortic valve regurgitation is mild. Aortic regurgitation PHT measures 323 msec. Mild to moderate aortic stenosis is present. Aortic valve mean gradient measures 11.0 mmHg. Aortic valve peak gradient  measures 16.6 mmHg. Aortic valve area, by VTI measures 1.30 cm. Pulmonic Valve: The pulmonic valve was normal in structure. Pulmonic valve regurgitation is not visualized. No evidence of pulmonic stenosis. Aorta: The aortic root is normal in size and structure. Venous: The inferior vena cava is normal in size with greater than 50% respiratory variability, suggesting right atrial pressure of 3 mmHg. IAS/Shunts: No atrial level shunt detected by color flow Doppler.  LEFT VENTRICLE PLAX 2D LVIDd:         4.30 cm  Diastology LVIDs:         3.60 cm  LV e' lateral:   6.64 cm/s LV PW:         1.00 cm  LV E/e' lateral: 17.0 LV IVS:        0.80 cm LVOT diam:     1.90 cm LV SV:         54 LV SV Index:   34 LVOT Area:     2.84 cm  LEFT ATRIUM             Index LA diam:        4.40 cm 2.79 cm/m LA Vol (A2C):   51.3 ml 32.51 ml/m LA Vol (A4C):   62.2 ml 39.42 ml/m LA Biplane Vol: 62.6 ml 39.67 ml/m  AORTIC VALVE                    PULMONIC VALVE AV Area (Vmax):    1.39 cm     PV Vmax:  1.22 m/s AV Area (Vmean):   1.28 cm     PV Vmean:      83.500 cm/s AV Area (VTI):     1.30 cm     PV VTI:        0.195 m AV Vmax:           204.00 cm/s  PV Peak grad:  6.0 mmHg AV Vmean:          157.000 cm/s PV Mean grad:  3.0 mmHg AV VTI:            0.418 m AV Peak Grad:      16.6 mmHg AV Mean Grad:      11.0 mmHg LVOT Vmax:         99.80 cm/s LVOT Vmean:        70.900 cm/s LVOT VTI:          0.191 m LVOT/AV VTI ratio: 0.46 AI PHT:            323 msec  AORTA Ao Root diam: 3.30 cm MITRAL VALVE MV Area (PHT): 3.43 cm     SHUNTS MV Area VTI:   1.38 cm     Systemic VTI:  0.19 m MV Peak grad:  14.9 mmHg    Systemic Diam: 1.90 cm MV Mean grad:  6.0 mmHg MV Vmax:       1.93 m/s MV Vmean:      114.0 cm/s MV Decel Time: 221 msec MV E velocity: 113.00 cm/s MV A velocity: 161.00 cm/s MV E/A ratio:  0.70 Isaias Cowman MD Electronically signed by Isaias Cowman MD Signature Date/Time: 11/22/2020/8:41:29 AM    Final      Assessment/Plan  1. Benign essential HTN -BPs stable, continue metoprolol succinate ER, hydrochlorothiazide and lisinopril  2. PAD (peripheral artery disease) (HCC) -  S/P elective angiogram of the right lower extremity with stent placement on 11/20/2020 -   Continue Plavix and rosuvastatin -   Continue PT and OT  3. Major depressive disorder, recurrent, in remission ([place]) -Stable, continue fluoxetine  4. Acquired hypothyroidism Lab Results  Component Value Date   TSH 0.74 11/29/2020   -   Continue levothyroxine      Family/ staff Communication: Discussed plan of care with resident and charge nurse.  Labs/tests ordered: None  Goals of care:   Short-term care   Durenda Age, DNP, MSN, FNP-BC [facility] [phone] (Monday-Friday 8:00 a.m. - 5:00 p.m.) [phone] (after hours)

## 2020-12-14 ENCOUNTER — Encounter: Payer: Self-pay | Admitting: Internal Medicine

## 2020-12-14 ENCOUNTER — Non-Acute Institutional Stay (SKILLED_NURSING_FACILITY): Payer: Medicare Other | Admitting: Internal Medicine

## 2020-12-14 DIAGNOSIS — I739 Peripheral vascular disease, unspecified: Secondary | ICD-10-CM | POA: Diagnosis not present

## 2020-12-14 DIAGNOSIS — I10 Essential (primary) hypertension: Secondary | ICD-10-CM | POA: Diagnosis not present

## 2020-12-14 DIAGNOSIS — Z8673 Personal history of transient ischemic attack (TIA), and cerebral infarction without residual deficits: Secondary | ICD-10-CM

## 2020-12-14 DIAGNOSIS — I9581 Postprocedural hypotension: Secondary | ICD-10-CM

## 2020-12-14 DIAGNOSIS — R4189 Other symptoms and signs involving cognitive functions and awareness: Secondary | ICD-10-CM

## 2020-12-14 DIAGNOSIS — R29818 Other symptoms and signs involving the nervous system: Secondary | ICD-10-CM

## 2020-12-14 MED ORDER — ALBUTEROL SULFATE HFA 108 (90 BASE) MCG/ACT IN AERS: 2.0000 | INHALATION_SPRAY | RESPIRATORY_TRACT | 0 refills | Status: AC | PRN

## 2020-12-14 MED ORDER — LEVOTHYROXINE SODIUM 88 MCG PO TABS: 88.0000 ug | ORAL_TABLET | Freq: Every day | ORAL | 0 refills | Status: AC

## 2020-12-14 MED ORDER — FLUOXETINE HCL 40 MG PO CAPS: 40.0000 mg | ORAL_CAPSULE | Freq: Every day | ORAL | 0 refills | Status: AC

## 2020-12-14 MED ORDER — AZELASTINE HCL 0.1 % NA SOLN: 2.0000 | Freq: Every day | NASAL | 0 refills | Status: AC | PRN

## 2020-12-14 MED ORDER — FLOVENT HFA 44 MCG/ACT IN AERO: 2.0000 | INHALATION_SPRAY | Freq: Two times a day (BID) | RESPIRATORY_TRACT | 0 refills | Status: AC

## 2020-12-14 MED ORDER — HYDROCHLOROTHIAZIDE 25 MG PO TABS: 25.0000 mg | ORAL_TABLET | ORAL | 0 refills | Status: AC

## 2020-12-14 MED ORDER — ROSUVASTATIN CALCIUM 5 MG PO TABS: 5.0000 mg | ORAL_TABLET | ORAL | 0 refills | Status: AC

## 2020-12-14 MED ORDER — LISINOPRIL 5 MG PO TABS: 5.0000 mg | ORAL_TABLET | Freq: Every day | ORAL | 0 refills | Status: AC

## 2020-12-14 MED ORDER — METOPROLOL SUCCINATE ER 25 MG PO TB24: 25.0000 mg | ORAL_TABLET | Freq: Every day | ORAL | Status: AC

## 2020-12-14 MED ORDER — IPRATROPIUM BROMIDE 0.06 % NA SOLN: 1.0000 | Freq: Two times a day (BID) | NASAL | 0 refills | Status: AC

## 2020-12-14 MED ORDER — CLOPIDOGREL BISULFATE 75 MG PO TABS: 75.0000 mg | ORAL_TABLET | Freq: Every day | ORAL | 0 refills | Status: AC

## 2020-12-14 NOTE — Assessment & Plan Note (Signed)
12/14/2020 today she was able to tell me that she had been in the hospital for "an operation".  She was unable to name her PCP.

## 2020-12-14 NOTE — Patient Instructions (Signed)
See assessment and plan under each diagnosis in the problem list and acutely for this visit Vascular & Vein follow up appointment 12/20/2020.

## 2020-12-14 NOTE — Progress Notes (Signed)
NURSING HOME LOCATION:  [facility] ROOM NUMBER:  [id number] CODE STATUS:  DNR  PCP:  Tracie Harrier MD  The patient is being discharged from [facility] on this date by Unice Cobble MD.  The medical history in this facility was reviewed and summarized and medical problem list was updated. Time spent and note content is documented as follows.  Summary of [facility] medical records: Patient was admitted to the facility 4/18 after being hospitalized 4/11 - 11/27/2020 for elective angiogram of the right lower extremity with stent placement.  Unfortunately postop she developed dizziness, diaphoresis, and near syncope with hypotension with systolics in the 08M.  This was associated with dyspnea and chest pain.  Mild elevation troponins was attributed to demand ischemia.  IV fluids resulted in improved blood pressure.  Cardiology felt no further evaluation was indicated.  Echo revealed EF of 40-45% and grade 1 diastolic dysfunction, similar to findings in October 2018.  She was admitted to the SNF for PT/OT.  Significant past history includes essential hypertension, history of stroke, myelodysplastic syndrome, hypothyroidism, dyslipidemia, GERD, CKD, and history of congestive heart failure.  Surgeries and procedures include coronary angioplasty, CABG, and lumbar spine surgery.  When she was seen for the admission history and physical there were obvious neurocognitive deficits.  She could tell me why she had been in the hospital.  She was unaware that she had had any invasive procedures.   When asked questions she had a puzzled demeanor before responding slowly.  She was cradling a battery-powered mechanical cat on her lap which she seemed to think was real and which she had named "Jonni Sanger". She did complain of soreness around her ankles and chronic dyspnea which she relates to asthma.  She also had chronic constipation.  Remainder the review of systems was negative.  Review of  systems: Pertinent or active symptoms include: Today at the exit interview she was able to tell me that she been in the hospital for "an operation".  She then said it was "what you said ...vascular".  She states that she has minimal pain in the right lower extremity.  Her shortness of breath is stable.  Constipation is resolved but now she says she has diarrhea.  Actually she describes it as "runny".  The remainder the review of systems is negative.  Negative ROS: Constitutional: No fever, significant weight change, fatigue  Eyes: No redness, discharge, pain, vision change ENT/mouth: No nasal congestion, purulent discharge, earache, change in hearing, sore throat  Cardiovascular: No chest pain, palpitations, paroxysmal nocturnal dyspnea, claudication, edema  Respiratory: No cough, sputum production, hemoptysis,  significant snoring, apnea   Gastrointestinal: No heartburn, dysphagia, abdominal pain, nausea /vomiting, rectal bleeding, melena Genitourinary: No dysuria, hematuria, pyuria, incontinence, nocturia Musculoskeletal: No joint stiffness, joint swelling Dermatologic: No rash, pruritus, change in appearance of skin Neurologic: No dizziness, headache, syncope, seizures Psychiatric: No significant anxiety, depression, insomnia, anorexia Endocrine: No change in hair/skin/nails, excessive thirst, excessive hunger, excessive urination  Hematologic/lymphatic: No significant bruising, lymphadenopathy, abnormal bleeding  Physical exam:  Pertinent or positive findings: Again facies are blank and responses to queries are very slow.  Today's she told me that " Jonni Sanger went home to be with his sister".  When I asked what he ate she said that he simply "meows  and does not eat".  She could not give me her PCPs name. Eyebrows are essentially absent.  There is a crescendo decrescendo right basilar murmur with slight increase in S2.  She has  fine rales at the bases.  Dorsalis pedis pulses are stronger than  posterior tibial pulses.  There is trace edema at the sock line.  General appearance: no acute distress, increased work of breathing is present.   Lymphatic: No lymphadenopathy about the head, neck, axilla. Eyes: No conjunctival inflammation or lid edema is present. There is no scleral icterus. Ears:  External ear exam shows no significant lesions or deformities.   Nose:  External nasal examination shows no deformity or inflammation. Nasal mucosa are pink and moist without lesions, exudates Oral exam: Lips and gums are healthy appearing.There is no oropharyngeal erythema or exudate. Neck:  No thyromegaly, masses, tenderness noted.    Heart:  Normal rate and regular rhythm. S1 and S2 normal without gallop, click, rub.  Lungs:  without wheezes, rhonchi, rubs. Abdomen: Bowel sounds are normal. Abdomen is soft and nontender with no organomegaly, hernias, masses. GU: Deferred as previously addressed. Extremities:  No cyanosis, clubbing Neurologic exam: Balance, Rhomberg, finger to nose testing could not be completed due to clinical state Skin: Warm & dry w/o tenting. No significant lesions or rash.  See clinical summary of Discharge Diagnoses in the Problem List with associated updated therapeutic plan  Discharge instructions were written and discharge instructions provided. Follow-up will be by [facility] 5/11 and  the primary care physician in 10 - 14 days.

## 2020-12-14 NOTE — Assessment & Plan Note (Signed)
SNF Wound Care Nurse reports no op site issues. Vascular & Vein follow up appt 12/20/2020.

## 2020-12-14 NOTE — Assessment & Plan Note (Signed)
Blood pressure presently controlled on HCTZ, ACE inhibitor, and beta-blocker without documented hypotension at the SNF.

## 2020-12-14 NOTE — Assessment & Plan Note (Signed)
BP controlled; no change in antihypertensive medications  

## 2020-12-14 NOTE — Assessment & Plan Note (Signed)
12/14/2020 she is weak in all extremities to opposition without clinically significant hemiparesis. Low-dose aspirin and clopidogrel will be continued.  Secondary prevention with focus on hypertension and dyslipidemia.

## 2020-12-19 ENCOUNTER — Telehealth (INDEPENDENT_AMBULATORY_CARE_PROVIDER_SITE_OTHER): Payer: Self-pay | Admitting: Vascular Surgery

## 2020-12-19 NOTE — Telephone Encounter (Signed)
Patient can be schedule to see Dr Lucky Cowboy for follow up and let's keep upcoming appointment (01/10/21)as schedule

## 2020-12-19 NOTE — Telephone Encounter (Signed)
Patient daughter called in stating patient is refusing to have any type of screening until she has a discussion with Dr. Lucky Cowboy.  Daughter states she had a bad experience with her last angiogram and is refusing to have her regular appointment on 6/1.  I let daughter know I can't remove appointments and schedule with Dr. Lucky Cowboy, the provider will have to discuss if the the studies are truly needed. Patient has only seen Arna Medici for follow ups, Dr. Lucky Cowboy did her procedures.  Please advise.

## 2020-12-19 NOTE — Telephone Encounter (Signed)
Patient appointment scheduled for May 24th at 3:45.

## 2020-12-20 ENCOUNTER — Ambulatory Visit (INDEPENDENT_AMBULATORY_CARE_PROVIDER_SITE_OTHER): Payer: Medicare Other | Admitting: Nurse Practitioner

## 2020-12-20 ENCOUNTER — Encounter (INDEPENDENT_AMBULATORY_CARE_PROVIDER_SITE_OTHER): Payer: Medicare Other

## 2021-01-02 ENCOUNTER — Ambulatory Visit (INDEPENDENT_AMBULATORY_CARE_PROVIDER_SITE_OTHER): Payer: Medicare Other | Admitting: Vascular Surgery

## 2021-01-09 ENCOUNTER — Ambulatory Visit (INDEPENDENT_AMBULATORY_CARE_PROVIDER_SITE_OTHER): Payer: Medicare Other | Admitting: Vascular Surgery

## 2021-01-09 ENCOUNTER — Encounter (INDEPENDENT_AMBULATORY_CARE_PROVIDER_SITE_OTHER): Payer: Self-pay | Admitting: Vascular Surgery

## 2021-01-09 ENCOUNTER — Other Ambulatory Visit (INDEPENDENT_AMBULATORY_CARE_PROVIDER_SITE_OTHER): Payer: Self-pay | Admitting: Vascular Surgery

## 2021-01-09 ENCOUNTER — Other Ambulatory Visit: Payer: Self-pay

## 2021-01-09 VITALS — BP 126/67 | HR 67 | Ht 62.0 in | Wt 130.0 lb

## 2021-01-09 DIAGNOSIS — N1831 Chronic kidney disease, stage 3a: Secondary | ICD-10-CM | POA: Diagnosis not present

## 2021-01-09 DIAGNOSIS — I701 Atherosclerosis of renal artery: Secondary | ICD-10-CM

## 2021-01-09 DIAGNOSIS — E785 Hyperlipidemia, unspecified: Secondary | ICD-10-CM

## 2021-01-09 DIAGNOSIS — I739 Peripheral vascular disease, unspecified: Secondary | ICD-10-CM

## 2021-01-09 DIAGNOSIS — I10 Essential (primary) hypertension: Secondary | ICD-10-CM | POA: Diagnosis not present

## 2021-01-09 DIAGNOSIS — Z95820 Peripheral vascular angioplasty status with implants and grafts: Secondary | ICD-10-CM

## 2021-01-09 DIAGNOSIS — I70221 Atherosclerosis of native arteries of extremities with rest pain, right leg: Secondary | ICD-10-CM

## 2021-01-09 NOTE — Assessment & Plan Note (Signed)
lipid control important in reducing the progression of atherosclerotic disease. Continue statin therapy  

## 2021-01-09 NOTE — Assessment & Plan Note (Signed)
blood pressure control important in reducing the progression of atherosclerotic disease. On appropriate oral medications.  

## 2021-01-09 NOTE — Assessment & Plan Note (Signed)
Patient had a prolonged difficult recovery after an angiogram with revascularization 2 months ago.  She is not going to have any other procedures no matter what at this point.  She understands that some point this could lead to limb loss or death.  She is comfortable with that.  As such, I am not going to plan on checking her flow any further with noninvasive studies and will only see her back as needed.

## 2021-01-09 NOTE — Progress Notes (Signed)
MRN : [id number]  Yvonne Parrish is a 87 y.o. ([birth year]) female who presents with chief complaint of  Chief Complaint  Patient presents with  . Follow-up    Add on per phone note   .  History of Present Illness: Patient returns today in follow up of her PAD and other vascular issues.  She had a angiogram with intervention in the right lower extremity almost 2 months ago now.  She did not do well with this.  She had a prolonged hospitalization with renal insufficiency, heart failure and hypoxia issues, and went to rehab but has ultimately gone home.  Her foot feels better although she has significant swelling in both legs.  She has gained 6 pounds in the last week and feels poorly in general.  Her daughter has lots of questions about Plavix and the other medications were using.  The patient has decided she does not want to have any other procedures done no matter what.  Current Outpatient Medications  Medication Sig Dispense Refill  . acetaminophen (TYLENOL) 325 MG tablet Take 650 mg by mouth every 6 (six) hours as needed.    Marland Kitchen albuterol (VENTOLIN HFA) 108 (90 Base) MCG/ACT inhaler Inhale 2 puffs into the lungs every 4 (four) hours as needed for wheezing. 18 g 0  . aspirin EC 81 MG EC tablet Take 1 tablet (81 mg total) by mouth daily. Swallow whole.    Marland Kitchen azelastine (ASTELIN) 0.1 % nasal spray Place 2 sprays into both nostrils daily as needed for rhinitis or allergies. 30 mL 0  . bisacodyl (DULCOLAX) 10 MG suppository Place 10 mg rectally as needed for moderate constipation.    . Calcium Carbonate-Vitamin D 600-400 MG-UNIT tablet Take 1 tablet by mouth daily.    . clopidogrel (PLAVIX) 75 MG tablet Take 1 tablet (75 mg total) by mouth daily. 30 tablet 0  . famotidine (PEPCID) 20 MG tablet Take 20 mg by mouth 2 (two) times daily.    Marland Kitchen FLOVENT HFA 44 MCG/ACT inhaler Inhale 2 puffs into the lungs in the morning and at bedtime. 10.6 g 0  . FLUoxetine (PROZAC) 40 MG capsule Take 1 capsule (40 mg  total) by mouth daily. 30 capsule 0  . hydrochlorothiazide (HYDRODIURIL) 25 MG tablet Take 1 tablet (25 mg total) by mouth every Monday, Wednesday, and Friday. 12 tablet 0  . ipratropium (ATROVENT) 0.06 % nasal spray Place 1 spray into the nose in the morning and at bedtime. 6 mL 0  . levothyroxine (SYNTHROID) 88 MCG tablet Take 1 tablet (88 mcg total) by mouth daily before breakfast. 30 tablet 0  . lisinopril (ZESTRIL) 5 MG tablet Take 1 tablet (5 mg total) by mouth daily. 30 tablet 0  . loratadine (CLARITIN) 10 MG tablet Take 10 mg by mouth daily.    . Magnesium Hydroxide (MILK OF MAGNESIA PO) Take 30 mLs by mouth as needed.    . metaxalone (SKELAXIN) 800 MG tablet Take 400 mg by mouth 3 (three) times daily.    . metoprolol succinate (TOPROL-XL) 25 MG 24 hr tablet Take 1 tablet (25 mg total) by mouth daily.    . Multiple Vitamin (MULTIVITAMIN WITH MINERALS) TABS tablet Take 1 tablet by mouth daily.    . polyethylene glycol powder (GLYCOLAX/MIRALAX) powder Take 17 g by mouth daily.    . potassium chloride SA (KLOR-CON) 20 MEQ tablet Take 20 mEq by mouth daily.    . rosuvastatin (CRESTOR) 5 MG tablet Take 1 tablet (5  mg total) by mouth every Monday, Wednesday, and Friday. 12 tablet 0  . Sodium Phosphates (RA SALINE ENEMA RE) Place rectally as needed.     No current facility-administered medications for this visit.    Past Medical History:  Diagnosis Date  . Arthritis   . Asthma in child    question of interstitial lung disease  . CHF (congestive heart failure) ([place])   . Chronic back pain   . Chronic pain of right knee    for past 30 years  . Heart murmur   . Heartburn   . History of right foot drop   . Hypertension   . Hypothyroidism   . Myelodysplastic syndrome ([place])   . Stroke (cerebrum) ([place]) 08/2016    Past Surgical History:  Procedure Laterality Date  . CORONARY ANGIOPLASTY    . CORONARY ARTERY BYPASS GRAFT  1999  . KNEE ARTHROSCOPY    . knee fracture     Right knee  pinning in '90's   . LOWER EXTREMITY ANGIOGRAPHY Right 11/20/2020   Procedure: LOWER EXTREMITY ANGIOGRAPHY;  Surgeon: Algernon Huxley, MD;  Location: [facility];  Service: Cardiovascular;  Laterality: Right;  . LUMBAR SPINE SURGERY    . TONSILLECTOMY       Social History   Tobacco Use  . Smoking status: Never Smoker  . Smokeless tobacco: Never Used  Vaping Use  . Vaping Use: Never used  Substance Use Topics  . Alcohol use: No  . Drug use: No     Family History  Problem Relation Age of Onset  . Stroke Mother   . Heart disease Father   . Kidney cancer Neg Hx   . Bladder Cancer Neg Hx   . Kidney disease Neg Hx     Allergies  Allergen Reactions  . Food [Wheat Bran] Shortness Of Breath and Itching    Sweet potatoes and Wheat     REVIEW OF SYSTEMS (Negative unless checked)  Constitutional: [] Weight loss  [] Fever  [] Chills Cardiac: [] Chest pain   [] Chest pressure   [] Palpitations   [] Shortness of breath when laying flat   [] Shortness of breath at rest   [x] Shortness of breath with exertion. Vascular:  [] Pain in legs with walking   [] Pain in legs at rest   [] Pain in legs when laying flat   [] Claudication   [] Pain in feet when walking  [] Pain in feet at rest  [] Pain in feet when laying flat   [] History of DVT   [] Phlebitis   [x] Swelling in legs   [] Varicose veins   [] Non-healing ulcers Pulmonary:   [] Uses home oxygen   [] Productive cough   [] Hemoptysis   [] Wheeze  [] COPD   [] Asthma Neurologic:  [x] Dizziness  [] Blackouts   [] Seizures   [] History of stroke   [] History of TIA  [] Aphasia   [] Temporary blindness   [] Dysphagia   [] Weakness or numbness in arms   [] Weakness or numbness in legs Musculoskeletal:  [x] Arthritis   [] Joint swelling   [x] Joint pain   [] Low back pain Hematologic:  [] Easy bruising  [] Easy bleeding   [] Hypercoagulable state   [x] Anemic   Gastrointestinal:  [] Blood in stool   [] Vomiting blood  [] Gastroesophageal reflux/heartburn   [] Abdominal  pain Genitourinary:  [x] Chronic kidney disease   [] Difficult urination  [] Frequent urination  [] Burning with urination   [] Hematuria Skin:  [] Rashes   [] Ulcers   [] Wounds Psychological:  [] History of anxiety   []  History of major depression.  Physical Examination  BP 126/67  Pulse 67   Ht 5\' 2"  (1.575 m)   Wt 130 lb (59 kg)   BMI 23.78 kg/m  Gen:  WD/WN, NAD Head: [place]/AT, No temporalis wasting. Ear/Nose/Throat: Hearing grossly intact, nares w/o erythema or drainage Eyes: Conjunctiva clear. Sclera non-icteric Neck: Supple.  Trachea midline Pulmonary:  Good air movement, no use of accessory muscles.  Cardiac: RRR, no JVD Vascular:  Vessel Right Left  Radial Palpable Palpable                          PT Not Palpable Not Palpable  DP 1+ Palpable 1+ Palpable   Gastrointestinal: soft, non-tender/non-distended. No guarding/reflex.  Musculoskeletal: M/S 5/5 throughout.  No deformity or atrophy. 2+ BLE edema. Neurologic: Sensation grossly intact in extremities.  Symmetrical.  Speech is fluent.  Psychiatric: Judgment intact, Mood & affect appropriate for pt's clinical situation. Dermatologic: No rashes or ulcers noted.  No cellulitis or open wounds.       Labs Recent Results (from the past 2160 hour(s))  SARS CORONAVIRUS 2 (TAT 6-24 HRS) Nasopharyngeal Nasopharyngeal Swab     Status: None   Collection Time: 11/16/20 12:13 PM   Specimen: Nasopharyngeal Swab  Result Value Ref Range   SARS Coronavirus 2 NEGATIVE NEGATIVE    Comment: (NOTE) SARS-CoV-2 target nucleic acids are NOT DETECTED.  The SARS-CoV-2 RNA is generally detectable in upper and lower respiratory specimens during the acute phase of infection. Negative results do not preclude SARS-CoV-2 infection, do not rule out co-infections with other pathogens, and should not be used as the sole basis for treatment or other patient management decisions. Negative results must be combined with clinical  observations, patient history, and epidemiological information. The expected result is Negative.  Fact Sheet for Patients: SugarRoll.be  Fact Sheet for Healthcare Providers: https://www.woods-mathews.com/  This test is not yet approved or cleared by the [place] FDA and  has been authorized for detection and/or diagnosis of SARS-CoV-2 by FDA under an Emergency Use Authorization (EUA). This EUA will remain  in effect (meaning this test can be used) for the duration of the COVID-19 declaration under Se ction 564(b)(1) of the Act, 21 U.S.C. section 360bbb-3(b)(1), unless the authorization is terminated or revoked sooner.  Performed at [facility], [place] [address], [place], [place] [address]   BUN     Status: None   Collection Time: 11/20/20 12:13 PM  Result Value Ref Range   BUN 16 8 - 23 mg/dL    Comment: Performed at [facility], [address] [place], [place] [address]  Creatinine, serum     Status: Abnormal   Collection Time: 11/20/20 12:13 PM  Result Value Ref Range   Creatinine, Ser 1.07 (H) 0.44 - 1.00 mg/dL   GFR, Estimated 50 (L) >60 mL/min    Comment: (NOTE) Calculated using the CKD-EPI Creatinine Equation (2021) Performed at [facility], [place], [place] [address]   Troponin I (High Sensitivity)     Status: Abnormal   Collection Time: 11/20/20  5:24 PM  Result Value Ref Range   Troponin I (High Sensitivity) 35 (H) <18 ng/L    Comment: (NOTE) Elevated high sensitivity troponin I (hsTnI) values and significant  changes across serial measurements may suggest ACS but many other  chronic and acute conditions are known to elevate hsTnI results.  Refer to the "Links" section for chest pain algorithms and additional  guidance. Performed at [facility], [address] [place], [place] [address]   CBC     Status: Abnormal   Collection Time: 11/20/20  6:29 PM  Result  Value Ref Range   WBC 15.6 (H) 4.0 - 10.5 K/uL   RBC 3.04 (L) 3.87 - 5.11 MIL/uL   Hemoglobin 10.4 (L) 12.0 - 15.0 g/dL   HCT 30.9 (L) 36.0 - 46.0 %   MCV 101.6 (H) 80.0 - 100.0 fL   MCH 34.2 (H) 26.0 - 34.0 pg   MCHC 33.7 30.0 - 36.0 g/dL   RDW 11.7 11.5 - 15.5 %   Platelets 180 150 - 400 K/uL   nRBC 0.0 0.0 - 0.2 %    Comment: Performed at [facility], [address] [place], [place] [address]  Basic metabolic panel     Status: Abnormal   Collection Time: 11/20/20  6:29 PM  Result Value Ref Range   Sodium 136 135 - 145 mmol/L   Potassium 3.4 (L) 3.5 - 5.1 mmol/L   Chloride 98 98 - 111 mmol/L   CO2 24 22 - 32 mmol/L   Glucose, Bld 136 (H) 70 - 99 mg/dL    Comment: Glucose reference range applies only to samples taken after fasting for at least 8 hours.   BUN 16 8 - 23 mg/dL   Creatinine, Ser 1.15 (H) 0.44 - 1.00 mg/dL   Calcium 9.2 8.9 - 10.3 mg/dL   GFR, Estimated 46 (L) >60 mL/min    Comment: (NOTE) Calculated using the CKD-EPI Creatinine Equation (2021)    Anion gap 14 5 - 15    Comment: Performed at [facility], [address] [place], [place] [address]  Magnesium     Status: None   Collection Time: 11/20/20  6:29 PM  Result Value Ref Range   Magnesium 1.7 1.7 - 2.4 mg/dL    Comment: Performed at [facility], [place], [place] [address]  Troponin I (High Sensitivity)     Status: Abnormal   Collection Time: 11/20/20  7:31 PM  Result Value Ref Range   Troponin I (High Sensitivity) 52 (H) <18 ng/L    Comment: (NOTE) Elevated high sensitivity troponin I (hsTnI) values and significant  changes across serial measurements may suggest ACS but many other  chronic and acute conditions are known to elevate hsTnI results.  Refer to the "Links" section for chest pain algorithms and additional  guidance. Performed at [facility], [address] [place], [place] [address]   CBC     Status: Abnormal   Collection Time:  11/21/20  6:25 AM  Result Value Ref Range   WBC 13.8 (H) 4.0 - 10.5 K/uL   RBC 3.06 (L) 3.87 - 5.11 MIL/uL   Hemoglobin 10.5 (L) 12.0 - 15.0 g/dL   HCT 30.5 (L) 36.0 - 46.0 %   MCV 99.7 80.0 - 100.0 fL   MCH 34.3 (H) 26.0 - 34.0 pg   MCHC 34.4 30.0 - 36.0 g/dL   RDW 11.9 11.5 - 15.5 %   Platelets 165 150 - 400 K/uL   nRBC 0.0 0.0 - 0.2 %    Comment: Performed at [facility], [address] [place], [place] [address]  Basic metabolic panel     Status: Abnormal   Collection Time: 11/21/20  6:25 AM  Result Value Ref Range   Sodium 136 135 - 145 mmol/L   Potassium 3.3 (L) 3.5 - 5.1 mmol/L   Chloride 99 98 - 111 mmol/L  CO2 23 22 - 32 mmol/L   Glucose, Bld 129 (H) 70 - 99 mg/dL    Comment: Glucose reference range applies only to samples taken after fasting for at least 8 hours.   BUN 17 8 - 23 mg/dL   Creatinine, Ser 0.95 0.44 - 1.00 mg/dL   Calcium 9.0 8.9 - 10.3 mg/dL   GFR, Estimated 57 (L) >60 mL/min    Comment: (NOTE) Calculated using the CKD-EPI Creatinine Equation (2021)    Anion gap 14 5 - 15    Comment: Performed at [facility], [address] [place], [place] [address]  Troponin I (High Sensitivity)     Status: Abnormal   Collection Time: 11/21/20  6:25 AM  Result Value Ref Range   Troponin I (High Sensitivity) 255 (HH) <18 ng/L    Comment: CRITICAL RESULT CALLED TO, READ BACK BY AND VERIFIED WITH ERICA [email or web address]  ON 11/21/20 BY HKP (NOTE) Elevated high sensitivity troponin I (hsTnI) values and significant  changes across serial measurements may suggest ACS but many other  chronic and acute conditions are known to elevate hsTnI results.  Refer to the "Links" section for chest pain algorithms and additional  guidance. Performed at [facility], [place], [place] [address]   Troponin I (High Sensitivity)     Status: Abnormal   Collection Time: 11/21/20  9:48 AM  Result Value Ref Range   Troponin I (High  Sensitivity) 204 (HH) <18 ng/L    Comment: CRITICAL VALUE NOTED. VALUE IS CONSISTENT WITH PREVIOUSLY REPORTED/CALLED VALUE SCS/HKP (NOTE) Elevated high sensitivity troponin I (hsTnI) values and significant  changes across serial measurements may suggest ACS but many other  chronic and acute conditions are known to elevate hsTnI results.  Refer to the "Links" section for chest pain algorithms and additional  guidance. Performed at [facility], [address] [place], [place] [address]   ECHOCARDIOGRAM COMPLETE     Status: None   Collection Time: 11/21/20  2:17 PM  Result Value Ref Range   Weight 2,096 oz   Height 61 in   BP 160/69 mmHg   Ao pk vel 2.04 m/s   AV Area VTI 1.30 cm2   AR max vel 1.39 cm2   AV Mean grad 11.0 mmHg   AV Peak grad 16.6 mmHg   S' Lateral 3.60 cm   AV Area mean vel 1.28 cm2   Area-P 1/2 3.43 cm2   P 1/2 time 323 msec   MV VTI 1.38 cm2  Basic metabolic panel     Status: Abnormal   Collection Time: 11/22/20  4:03 AM  Result Value Ref Range   Sodium 132 (L) 135 - 145 mmol/L   Potassium 3.4 (L) 3.5 - 5.1 mmol/L   Chloride 96 (L) 98 - 111 mmol/L   CO2 26 22 - 32 mmol/L   Glucose, Bld 129 (H) 70 - 99 mg/dL    Comment: Glucose reference range applies only to samples taken after fasting for at least 8 hours.   BUN 23 8 - 23 mg/dL   Creatinine, Ser 1.03 (H) 0.44 - 1.00 mg/dL   Calcium 8.7 (L) 8.9 - 10.3 mg/dL   GFR, Estimated 52 (L) >60 mL/min    Comment: (NOTE) Calculated using the CKD-EPI Creatinine Equation (2021)    Anion gap 10 5 - 15    Comment: Performed at [facility], [address], [place], [place] [address]  Magnesium     Status: None  Collection Time: 11/22/20  4:03 AM  Result Value Ref Range   Magnesium 1.8 1.7 - 2.4 mg/dL    Comment: Performed at [facility], [address] [place], [place] [address]  CBC     Status: Abnormal   Collection Time: 11/22/20  4:03 AM  Result Value Ref Range   WBC 14.0 (H)  4.0 - 10.5 K/uL   RBC 2.57 (L) 3.87 - 5.11 MIL/uL   Hemoglobin 8.8 (L) 12.0 - 15.0 g/dL   HCT 25.8 (L) 36.0 - 46.0 %   MCV 100.4 (H) 80.0 - 100.0 fL   MCH 34.2 (H) 26.0 - 34.0 pg   MCHC 34.1 30.0 - 36.0 g/dL   RDW 11.8 11.5 - 15.5 %   Platelets 139 (L) 150 - 400 K/uL   nRBC 0.2 0.0 - 0.2 %    Comment: Performed at [facility], [address] [place], [place] [address]  CBC with Differential/Platelet     Status: Abnormal   Collection Time: 11/23/20  6:08 AM  Result Value Ref Range   WBC 12.6 (H) 4.0 - 10.5 K/uL   RBC 2.51 (L) 3.87 - 5.11 MIL/uL   Hemoglobin 8.7 (L) 12.0 - 15.0 g/dL   HCT 25.2 (L) 36.0 - 46.0 %   MCV 100.4 (H) 80.0 - 100.0 fL   MCH 34.7 (H) 26.0 - 34.0 pg   MCHC 34.5 30.0 - 36.0 g/dL   RDW 11.9 11.5 - 15.5 %   Platelets 139 (L) 150 - 400 K/uL   nRBC 0.0 0.0 - 0.2 %   Neutrophils Relative % 71 %   Neutro Abs 9.0 (H) 1.7 - 7.7 K/uL   Lymphocytes Relative 11 %   Lymphs Abs 1.4 0.7 - 4.0 K/uL   Monocytes Relative 15 %   Monocytes Absolute 1.9 (H) 0.1 - 1.0 K/uL   Eosinophils Relative 2 %   Eosinophils Absolute 0.2 0.0 - 0.5 K/uL   Basophils Relative 0 %   Basophils Absolute 0.1 0.0 - 0.1 K/uL   Immature Granulocytes 1 %   Abs Immature Granulocytes 0.07 0.00 - 0.07 K/uL    Comment: Performed at [facility]: [address] [place], [place] [address]  Basic metabolic panel     Status: Abnormal   Collection Time: 11/23/20  6:08 AM  Result Value Ref Range   Sodium 133 (L) 135 - 145 mmol/L   Potassium 4.0 3.5 - 5.1 mmol/L   Chloride 97 (L) 98 - 111 mmol/L   CO2 27 22 - 32 mmol/L   Glucose, Bld 116 (H) 70 - 99 mg/dL    Comment: Glucose reference range applies only to samples taken after fasting for at least 8 hours.   BUN 27 (H) 8 - 23 mg/dL   Creatinine, Ser 1.15 (H) 0.44 - 1.00 mg/dL   Calcium 8.6 (L) 8.9 - 10.3 mg/dL   GFR, Estimated 46 (L) >60 mL/min    Comment: (NOTE) Calculated using the CKD-EPI Creatinine Equation (2021)    Anion  gap 9 5 - 15    Comment: Performed at [facility], [address], [place], [place] [address]  Basic metabolic panel     Status: Abnormal   Collection Time: 11/24/20  8:27 AM  Result Value Ref Range   Sodium 134 (L) 135 - 145 mmol/L   Potassium 3.7 3.5 - 5.1 mmol/L   Chloride 100 98 - 111 mmol/L   CO2 24 22 - 32 mmol/L   Glucose, Bld  103 (H) 70 - 99 mg/dL    Comment: Glucose reference range applies only to samples taken after fasting for at least 8 hours.   BUN 30 (H) 8 - 23 mg/dL   Creatinine, Ser 0.94 0.44 - 1.00 mg/dL   Calcium 8.6 (L) 8.9 - 10.3 mg/dL   GFR, Estimated 58 (L) >60 mL/min    Comment: (NOTE) Calculated using the CKD-EPI Creatinine Equation (2021)    Anion gap 10 5 - 15    Comment: Performed at [facility], [address] [place], [place] [address]  CBC with Differential/Platelet     Status: Abnormal   Collection Time: 11/24/20  8:27 AM  Result Value Ref Range   WBC 10.0 4.0 - 10.5 K/uL   RBC 2.72 (L) 3.87 - 5.11 MIL/uL   Hemoglobin 9.2 (L) 12.0 - 15.0 g/dL   HCT 27.6 (L) 36.0 - 46.0 %   MCV 101.5 (H) 80.0 - 100.0 fL   MCH 33.8 26.0 - 34.0 pg   MCHC 33.3 30.0 - 36.0 g/dL   RDW 11.9 11.5 - 15.5 %   Platelets 151 150 - 400 K/uL   nRBC 0.0 0.0 - 0.2 %   Neutrophils Relative % 69 %   Neutro Abs 7.1 1.7 - 7.7 K/uL   Lymphocytes Relative 12 %   Lymphs Abs 1.2 0.7 - 4.0 K/uL   Monocytes Relative 13 %   Monocytes Absolute 1.3 (H) 0.1 - 1.0 K/uL   Eosinophils Relative 4 %   Eosinophils Absolute 0.4 0.0 - 0.5 K/uL   Basophils Relative 1 %   Basophils Absolute 0.1 0.0 - 0.1 K/uL   Immature Granulocytes 1 %   Abs Immature Granulocytes 0.07 0.00 - 0.07 K/uL    Comment: Performed at [facility], [address] [place], [place] [address]  Resp Panel by RT-PCR (Flu A&B, Covid) Nasopharyngeal Swab     Status: None   Collection Time: 11/27/20 10:46 AM   Specimen: Nasopharyngeal Swab; Nasopharyngeal(NP) swabs in vial transport medium   Result Value Ref Range   SARS Coronavirus 2 by RT PCR NEGATIVE NEGATIVE    Comment: (NOTE) SARS-CoV-2 target nucleic acids are NOT DETECTED.  The SARS-CoV-2 RNA is generally detectable in upper respiratory specimens during the acute phase of infection. The lowest concentration of SARS-CoV-2 viral copies this assay can detect is 138 copies/mL. A negative result does not preclude SARS-Cov-2 infection and should not be used as the sole basis for treatment or other patient management decisions. A negative result may occur with  improper specimen collection/handling, submission of specimen other than nasopharyngeal swab, presence of viral mutation(s) within the areas targeted by this assay, and inadequate number of viral copies(<138 copies/mL). A negative result must be combined with clinical observations, patient history, and epidemiological information. The expected result is Negative.  Fact Sheet for Patients:  EntrepreneurPulse.com.au  Fact Sheet for Healthcare Providers:  IncredibleEmployment.be  This test is no t yet approved or cleared by the [place] FDA and  has been authorized for detection and/or diagnosis of SARS-CoV-2 by FDA under an Emergency Use Authorization (EUA). This EUA will remain  in effect (meaning this test can be used) for the duration of the COVID-19 declaration under Section 564(b)(1) of the Act, 21 U.S.C.section 360bbb-3(b)(1), unless the authorization is terminated  or revoked sooner.       Influenza A by PCR NEGATIVE NEGATIVE   Influenza B by PCR NEGATIVE NEGATIVE    Comment: (NOTE) The Xpert Xpress SARS-CoV-2/FLU/RSV  plus assay is intended as an aid in the diagnosis of influenza from Nasopharyngeal swab specimens and should not be used as a sole basis for treatment. Nasal washings and aspirates are unacceptable for Xpert Xpress SARS-CoV-2/FLU/RSV testing.  Fact Sheet for  Patients: EntrepreneurPulse.com.au  Fact Sheet for Healthcare Providers: IncredibleEmployment.be  This test is not yet approved or cleared by the [place] FDA and has been authorized for detection and/or diagnosis of SARS-CoV-2 by FDA under an Emergency Use Authorization (EUA). This EUA will remain in effect (meaning this test can be used) for the duration of the COVID-19 declaration under Section 564(b)(1) of the Act, 21 U.S.C. section 360bbb-3(b)(1), unless the authorization is terminated or revoked.  Performed at [facility], [address] [place], [place] [address]   Vitamin B12     Status: None   Collection Time: 11/29/20 12:00 AM  Result Value Ref Range   Vitamin B-12 917   TSH     Status: None   Collection Time: 11/29/20 12:00 AM  Result Value Ref Range   TSH 0.74 0.41 - 5.90    Radiology No results found.  Assessment/Plan  PAD (peripheral artery disease) ([place]) Patient had a prolonged difficult recovery after an angiogram with revascularization 2 months ago.  She is not going to have any other procedures no matter what at this point.  She understands that some point this could lead to limb loss or death.  She is comfortable with that.  As such, I am not going to plan on checking her flow any further with noninvasive studies and will only see her back as needed.  Hypertension blood pressure control important in reducing the progression of atherosclerotic disease. On appropriate oral medications.   CKD (chronic kidney disease), stage III ([place]) Worsened after her procedure.  She has volume overload now on a low dose of diuretics.  I have asked him to discuss this with her primary care physician.  Hyperlipidemia, unspecified lipid control important in reducing the progression of atherosclerotic disease. Continue statin therapy     Leotis Pain, MD  01/09/2021 5:51 PM    This note was created with Dragon medical  transcription system.  Any errors from dictation are purely unintentional

## 2021-01-09 NOTE — Assessment & Plan Note (Signed)
Worsened after her procedure.  She has volume overload now on a low dose of diuretics.  I have asked him to discuss this with her primary care physician.

## 2021-01-10 ENCOUNTER — Ambulatory Visit (INDEPENDENT_AMBULATORY_CARE_PROVIDER_SITE_OTHER): Payer: Medicare Other | Admitting: Nurse Practitioner

## 2021-01-10 ENCOUNTER — Encounter (INDEPENDENT_AMBULATORY_CARE_PROVIDER_SITE_OTHER): Payer: Medicare Other

## 2021-03-03 ENCOUNTER — Emergency Department
Admission: EM | Admit: 2021-03-03 | Discharge: 2021-03-03 | Disposition: A | Discharge status: Home / Self Care | Payer: Medicare Other | Attending: Student in an Organized Health Care Education/Training Program | Admitting: Student in an Organized Health Care Education/Training Program

## 2021-03-03 ENCOUNTER — Emergency Department: Payer: Medicare Other

## 2021-03-03 ENCOUNTER — Encounter: Payer: Self-pay | Admitting: Emergency Medicine

## 2021-03-03 ENCOUNTER — Other Ambulatory Visit: Payer: Self-pay

## 2021-03-03 DIAGNOSIS — I5022 Chronic systolic (congestive) heart failure: Secondary | ICD-10-CM | POA: Insufficient documentation

## 2021-03-03 DIAGNOSIS — Z7982 Long term (current) use of aspirin: Secondary | ICD-10-CM | POA: Diagnosis not present

## 2021-03-03 DIAGNOSIS — N183 Chronic kidney disease, stage 3 unspecified: Secondary | ICD-10-CM | POA: Diagnosis not present

## 2021-03-03 DIAGNOSIS — Z79899 Other long term (current) drug therapy: Secondary | ICD-10-CM | POA: Diagnosis not present

## 2021-03-03 DIAGNOSIS — E039 Hypothyroidism, unspecified: Secondary | ICD-10-CM | POA: Diagnosis not present

## 2021-03-03 DIAGNOSIS — R531 Weakness: Secondary | ICD-10-CM | POA: Insufficient documentation

## 2021-03-03 DIAGNOSIS — I130 Hypertensive heart and chronic kidney disease with heart failure and stage 1 through stage 4 chronic kidney disease, or unspecified chronic kidney disease: Secondary | ICD-10-CM | POA: Insufficient documentation

## 2021-03-03 DIAGNOSIS — Z7951 Long term (current) use of inhaled steroids: Secondary | ICD-10-CM | POA: Diagnosis not present

## 2021-03-03 DIAGNOSIS — R079 Chest pain, unspecified: Secondary | ICD-10-CM

## 2021-03-03 DIAGNOSIS — I2510 Atherosclerotic heart disease of native coronary artery without angina pectoris: Secondary | ICD-10-CM | POA: Insufficient documentation

## 2021-03-03 DIAGNOSIS — J449 Chronic obstructive pulmonary disease, unspecified: Secondary | ICD-10-CM | POA: Diagnosis not present

## 2021-03-03 DIAGNOSIS — J45909 Unspecified asthma, uncomplicated: Secondary | ICD-10-CM | POA: Insufficient documentation

## 2021-03-03 DIAGNOSIS — Z20822 Contact with and (suspected) exposure to covid-19: Secondary | ICD-10-CM | POA: Insufficient documentation

## 2021-03-03 DIAGNOSIS — R0789 Other chest pain: Secondary | ICD-10-CM | POA: Diagnosis present

## 2021-03-03 DIAGNOSIS — R61 Generalized hyperhidrosis: Secondary | ICD-10-CM | POA: Diagnosis not present

## 2021-03-03 LAB — BRAIN NATRIURETIC PEPTIDE: B Natriuretic Peptide: 615.2 pg/mL — ABNORMAL HIGH (ref 0.0–100.0)

## 2021-03-03 LAB — CBC
HCT: 31.3 % — ABNORMAL LOW (ref 36.0–46.0)
Hemoglobin: 10.5 g/dL — ABNORMAL LOW (ref 12.0–15.0)
MCH: 35.2 pg — ABNORMAL HIGH (ref 26.0–34.0)
MCHC: 33.5 g/dL (ref 30.0–36.0)
MCV: 105 fL — ABNORMAL HIGH (ref 80.0–100.0)
Platelets: 158 10*3/uL (ref 150–400)
RBC: 2.98 MIL/uL — ABNORMAL LOW (ref 3.87–5.11)
RDW: 11.6 % (ref 11.5–15.5)
WBC: 5.7 10*3/uL (ref 4.0–10.5)
nRBC: 0 % (ref 0.0–0.2)

## 2021-03-03 LAB — RESP PANEL BY RT-PCR (FLU A&B, COVID) ARPGX2
Influenza A by PCR: NEGATIVE
Influenza B by PCR: NEGATIVE
SARS Coronavirus 2 by RT PCR: NEGATIVE

## 2021-03-03 LAB — BASIC METABOLIC PANEL
Anion gap: 6 (ref 5–15)
BUN: 17 mg/dL (ref 8–23)
CO2: 29 mmol/L (ref 22–32)
Calcium: 9.3 mg/dL (ref 8.9–10.3)
Chloride: 101 mmol/L (ref 98–111)
Creatinine, Ser: 1.12 mg/dL — ABNORMAL HIGH (ref 0.44–1.00)
GFR, Estimated: 47 mL/min — ABNORMAL LOW (ref >60)
Glucose, Bld: 96 mg/dL (ref 70–99)
Potassium: 3.6 mmol/L (ref 3.5–5.1)
Sodium: 136 mmol/L (ref 135–145)

## 2021-03-03 LAB — TROPONIN I (HIGH SENSITIVITY)
Troponin I (High Sensitivity): 40 ng/L — ABNORMAL HIGH (ref <18)
Troponin I (High Sensitivity): 38 ng/L — ABNORMAL HIGH (ref <18)

## 2021-03-03 MED ORDER — ISOSORBIDE MONONITRATE ER 60 MG PO TB24
30.0000 mg | ORAL_TABLET | Freq: Every day | ORAL | Status: DC
  Administered 2021-03-03: 30 mg via ORAL
  Filled 2021-03-03: qty 1

## 2021-03-03 MED ORDER — IOHEXOL 350 MG/ML SOLN
75.0000 mL | Freq: Once | INTRAVENOUS | Status: AC | PRN
  Administered 2021-03-03: 75 mL via INTRAVENOUS

## 2021-03-03 MED ORDER — ISOSORBIDE MONONITRATE ER 30 MG PO TB24: 30.0000 mg | ORAL_TABLET | Freq: Every day | ORAL | 0 refills | Status: AC

## 2021-03-03 MED ORDER — AMLODIPINE BESYLATE 5 MG PO TABS
5.0000 mg | ORAL_TABLET | Freq: Once | ORAL | Status: AC
  Administered 2021-03-03: 5 mg via ORAL
  Filled 2021-03-03: qty 1

## 2021-03-03 NOTE — ED Provider Notes (Signed)
[facility] Emergency Department Provider Note    None    (approximate)  I have reviewed the triage vital signs and the nursing notes.   HISTORY  Chief Complaint Chest Pain and Shortness of Breath    HPI Yvonne Parrish is a 87 y.o. female who presents to the ER with the below listed past medical history here with 3 episodes of chest tightness and pressure nonradiating does go through to her back and does have some pleuritic discomfort.  Did have some associated diaphoresis with these episodes.  Last episode was last night.  Has been compliant with her medications.  Recently started Lasix 1 month ago has not noted any increasing orthopnea or leg swelling.  States she does feel generalized weakness at this time.  No cough or congestion.   Past Medical History:  Diagnosis Date   Arthritis    Asthma in child    question of interstitial lung disease   CHF (congestive heart failure) (HCC)    Chronic back pain    Chronic pain of right knee    for past 30 years   Heart murmur    Heartburn    History of right foot drop    Hypertension    Hypothyroidism    Myelodysplastic syndrome ([place])    Stroke (cerebrum) ([place]) 08/2016   Family History  Problem Relation Age of Onset   Stroke Mother    Heart disease Father    Kidney cancer Neg Hx    Bladder Cancer Neg Hx    Kidney disease Neg Hx    Past Surgical History:  Procedure Laterality Date   CORONARY ANGIOPLASTY     CORONARY ARTERY BYPASS GRAFT  1999   KNEE ARTHROSCOPY     knee fracture     Right knee pinning in '90's    LOWER EXTREMITY ANGIOGRAPHY Right 11/20/2020   Procedure: LOWER EXTREMITY ANGIOGRAPHY;  Surgeon: Algernon Huxley, MD;  Location: [facility];  Service: Cardiovascular;  Laterality: Right;   LUMBAR SPINE SURGERY     TONSILLECTOMY     Patient Active Problem List   Diagnosis Date Noted   CKD (chronic kidney disease), stage III ([place]) 11/28/2020   Neurocognitive deficits 11/28/2020    PAD (peripheral artery disease) ([place]) 11/20/2020   Hypotension 11/20/2020   Mild left ventricular systolic dysfunction [id number]   Major depressive disorder, recurrent, in remission ([place]) 06/07/2019   Macrocytic anemia 02/03/2019   Renal insufficiency 02/03/2019   Heart palpitations 04/15/2018   History of CVA (cerebrovascular accident) 06/10/2017   Carpal tunnel syndrome of right wrist 12/04/2016   Osteoarthritis of finger of right hand 12/04/2016   Primary osteoarthritis of first carpometacarpal joint of right hand 12/04/2016   Hemiparesis affecting dominant side as late effect of cerebrovascular accident (CVA) ([place]) 10/01/2016   Right foot drop 10/01/2016   Benign essential HTN    Chronic systolic CHF (congestive heart failure) ([place])    Acute blood loss anemia    Hypokalemia    Sleep disturbance    Gait disturbance, post-stroke 08/22/2016   Left pontine stroke ([place]) 08/22/2016   Slurred speech    Chronic obstructive pulmonary disease ([place])    Coronary artery disease involving coronary bypass graft of native heart without angina pectoris    Leukocytosis    Hyperglycemia    Right hemiparesis ([place])    COPD exacerbation ([place]) 08/20/2016   Aphasia 08/20/2016   Asthma without status asthmaticus 08/20/2016   CHF (congestive heart failure) (  [place]) 08/20/2016   Hyperlipidemia, unspecified 08/20/2016   Hypertension 08/20/2016   Myelodysplastic syndrome ([place]) 08/20/2016   Osteoporosis, post-menopausal 08/20/2016   Stroke (cerebrum) ([place]) 08/20/2016   Ischemic stroke ([place])    Cardiomyopathy, dilated ([place]) 01/10/2016   H/O cardiac catheterization 01/10/2016   SOB (shortness of breath) on exertion 01/10/2016   Chronic back pain 11/01/2014   History of coronary artery bypass graft 11/01/2014   Pure hypercholesterolemia 11/01/2014   Acquired hypothyroidism 07/05/2014      Prior to Admission medications   Medication Sig Start Date End Date Taking? Authorizing Provider   acetaminophen (TYLENOL) 325 MG tablet Take 650 mg by mouth every 6 (six) hours as needed.    [provider]  albuterol (VENTOLIN HFA) 108 (90 Base) MCG/ACT inhaler Inhale 2 puffs into the lungs every 4 (four) hours as needed for wheezing. 12/14/20   Hendricks Limes, MD  aspirin EC 81 MG EC tablet Take 1 tablet (81 mg total) by mouth daily. Swallow whole. 11/28/20 11/28/21  Jennye Boroughs, MD  azelastine (ASTELIN) 0.1 % nasal spray Place 2 sprays into both nostrils daily as needed for rhinitis or allergies. 12/14/20   Hendricks Limes, MD  bisacodyl (DULCOLAX) 10 MG suppository Place 10 mg rectally as needed for moderate constipation.    [provider]  Calcium Carbonate-Vitamin D 600-400 MG-UNIT tablet Take 1 tablet by mouth daily.    [provider]  clopidogrel (PLAVIX) 75 MG tablet Take 1 tablet (75 mg total) by mouth daily. 12/14/20   Hendricks Limes, MD  famotidine (PEPCID) 20 MG tablet Take 20 mg by mouth 2 (two) times daily.    [provider]  FLOVENT HFA 44 MCG/ACT inhaler Inhale 2 puffs into the lungs in the morning and at bedtime. 12/14/20   Hendricks Limes, MD  FLUoxetine (PROZAC) 40 MG capsule Take 1 capsule (40 mg total) by mouth daily. 12/14/20   Hendricks Limes, MD  hydrochlorothiazide (HYDRODIURIL) 25 MG tablet Take 1 tablet (25 mg total) by mouth every Monday, Wednesday, and Friday. 12/15/20   Hendricks Limes, MD  ipratropium (ATROVENT) 0.06 % nasal spray Place 1 spray into the nose in the morning and at bedtime. 12/14/20 01/13/21  Hendricks Limes, MD  levothyroxine (SYNTHROID) 88 MCG tablet Take 1 tablet (88 mcg total) by mouth daily before breakfast. 12/14/20   Hendricks Limes, MD  lisinopril (ZESTRIL) 5 MG tablet Take 1 tablet (5 mg total) by mouth daily. 12/14/20   Hendricks Limes, MD  loratadine (CLARITIN) 10 MG tablet Take 10 mg by mouth daily.    [provider]  Magnesium Hydroxide (MILK OF MAGNESIA PO) Take 30 mLs by mouth as  needed.    [provider]  metaxalone (SKELAXIN) 800 MG tablet Take 400 mg by mouth 3 (three) times daily. 11/14/20   [provider]  metoprolol succinate (TOPROL-XL) 25 MG 24 hr tablet Take 1 tablet (25 mg total) by mouth daily. 12/14/20   Hendricks Limes, MD  Multiple Vitamin (MULTIVITAMIN WITH MINERALS) TABS tablet Take 1 tablet by mouth daily.    [provider]  polyethylene glycol powder (GLYCOLAX/MIRALAX) powder Take 17 g by mouth daily.    [provider]  potassium chloride SA (KLOR-CON) 20 MEQ tablet Take 20 mEq by mouth daily. 02/08/19   [provider]  rosuvastatin (CRESTOR) 5 MG tablet Take 1 tablet (5 mg total) by mouth every Monday, Wednesday, and Friday. 12/15/20   Unice Cobble  F, MD  Sodium Phosphates (RA SALINE ENEMA RE) Place rectally as needed.    [provider]    Allergies Food [wheat bran]    Social History Social History   Tobacco Use   Smoking status: Never   Smokeless tobacco: Never  Vaping Use   Vaping Use: Never used  Substance Use Topics   Alcohol use: No   Drug use: No    Review of Systems Patient denies headaches, rhinorrhea, blurry vision, numbness, shortness of breath, chest pain, edema, cough, abdominal pain, nausea, vomiting, diarrhea, dysuria, fevers, rashes or hallucinations unless otherwise stated above in HPI. ____________________________________________   PHYSICAL EXAM:  VITAL SIGNS: Vitals:   03/03/21 1156  BP: (!) 160/75  Pulse: 67  Resp: 16  Temp: 98.6 F (37 C)  SpO2: 96%    Constitutional: Alert and oriented.  Eyes: Conjunctivae are normal.  Head: Atraumatic. Nose: No congestion/rhinnorhea. Mouth/Throat: Mucous membranes are moist.   Neck: No stridor. Painless ROM.  Cardiovascular: Normal rate, regular rhythm. Grossly normal heart sounds.  Good peripheral circulation. Respiratory: Normal respiratory effort.  No retractions. Lungs CTAB. Gastrointestinal: Soft and  nontender. No distention. No abdominal bruits. No CVA tenderness. Genitourinary:  Musculoskeletal: No lower extremity tenderness nor edema.  No joint effusions. Neurologic:  Normal speech and language. No gross focal neurologic deficits are appreciated. No facial droop Skin:  Skin is warm, dry and intact. No rash noted. Psychiatric: Mood and affect are normal. Speech and behavior are normal.  ____________________________________________   LABS (all labs ordered are listed, but only abnormal results are displayed)  Results for orders placed or performed during the hospital encounter of 03/03/21 (from the past 24 hour(s))  Basic metabolic panel     Status: Abnormal   Collection Time: 03/03/21 11:49 AM  Result Value Ref Range   Sodium 136 135 - 145 mmol/L   Potassium 3.6 3.5 - 5.1 mmol/L   Chloride 101 98 - 111 mmol/L   CO2 29 22 - 32 mmol/L   Glucose, Bld 96 70 - 99 mg/dL   BUN 17 8 - 23 mg/dL   Creatinine, Ser 1.12 (H) 0.44 - 1.00 mg/dL   Calcium 9.3 8.9 - 10.3 mg/dL   GFR, Estimated 47 (L) >60 mL/min   Anion gap 6 5 - 15  CBC     Status: Abnormal   Collection Time: 03/03/21 11:49 AM  Result Value Ref Range   WBC 5.7 4.0 - 10.5 K/uL   RBC 2.98 (L) 3.87 - 5.11 MIL/uL   Hemoglobin 10.5 (L) 12.0 - 15.0 g/dL   HCT 31.3 (L) 36.0 - 46.0 %   MCV 105.0 (H) 80.0 - 100.0 fL   MCH 35.2 (H) 26.0 - 34.0 pg   MCHC 33.5 30.0 - 36.0 g/dL   RDW 11.6 11.5 - 15.5 %   Platelets 158 150 - 400 K/uL   nRBC 0.0 0.0 - 0.2 %  Troponin I (High Sensitivity)     Status: Abnormal   Collection Time: 03/03/21 11:49 AM  Result Value Ref Range   Troponin I (High Sensitivity) 40 (H) <18 ng/L  Brain natriuretic peptide     Status: Abnormal   Collection Time: 03/03/21 11:53 AM  Result Value Ref Range   B Natriuretic Peptide 615.2 (H) 0.0 - 100.0 pg/mL  Troponin I (High Sensitivity)     Status: Abnormal   Collection Time: 03/03/21  2:43 PM  Result Value Ref Range   Troponin I (High Sensitivity) 38 (H) <18  ng/L  ____________________________________________  EKG My review and personal interpretation at Time: 11:48   Indication: chest pain  Rate: 70  Rhythm: sinus Axis: left Other: no stemi, lbbb, consistent with previous tracing ____________________________________________  RADIOLOGY  I personally reviewed all radiographic images ordered to evaluate for the above acute complaints and reviewed radiology reports and findings.  These findings were personally discussed with the patient.  Please see medical record for radiology report.  ____________________________________________   PROCEDURES  Procedure(s) performed:  Procedures    Critical Care performed: no ____________________________________________   INITIAL IMPRESSION / ASSESSMENT AND PLAN / ED COURSE  Pertinent labs & imaging results that were available during my care of the patient were reviewed by me and considered in my medical decision making (see chart for details).   DDX: ACS, pericarditis, esophagitis, boerhaaves, pe, dissection, pna, bronchitis, costochondritis   Yvonne Parrish is a 87 y.o. who presents to the ED with presentation as described above.  She is currently pain-free with no significant EKG changes and a stable troponin elevation from previous.  Have a lower suspicion for acute ACS but symptoms are concerning for angina but given her pleuritic pain with history of immobility will order CTA to rule out PE.  Clinical Course as of 03/03/21 2309  Sat Mar 03, 2021  1803 CT is reassuring.  She is not having any active chest pain or discomfort at this time.  Discussion with patient family prefer to go home if at all possible with outpatient follow-up.  I will consult with cardiology [PR]  1825 Patient remains pain-free and feels comfortable plan to go home.  After discussion with cardiology plan will be to start patient on low-dose Imdur and conservative management.  Patient and family agree with plan.  Discussed  return precautions need for outpatient follow-up. [PR]    Clinical Course User Index [PR] Merlyn Lot, MD    The patient was evaluated in Emergency Department today for the symptoms described in the history of present illness. He/she was evaluated in the context of the global COVID-19 pandemic, which necessitated consideration that the patient might be at risk for infection with the SARS-CoV-2 virus that causes COVID-19. Institutional protocols and algorithms that pertain to the evaluation of patients at risk for COVID-19 are in a state of rapid change based on information released by regulatory bodies including the CDC and federal and state organizations. These policies and algorithms were followed during the patient's care in the ED.  As part of my medical decision making, I reviewed the following data within the [place] notes reviewed and incorporated, Labs reviewed, notes from prior ED visits and [place] Controlled Substance Database   ____________________________________________   FINAL CLINICAL IMPRESSION(S) / ED DIAGNOSES  Final diagnoses:  Chest pain, unspecified type      NEW MEDICATIONS STARTED DURING THIS VISIT:  New Prescriptions   No medications on file     Note:  This document was prepared using Dragon voice recognition software and may include unintentional dictation errors.    Merlyn Lot, MD 03/03/21 [phone]

## 2021-03-03 NOTE — ED Triage Notes (Signed)
Pt via BIB POV from home. Per daughter, pt has been having intermittent episodes for L sided non-radiating CP for the past week. Per daughter, pt also c/o SOB. Denies cough/fever. Pt has a hx CAD, mitral valve, MI, and bypass surgery.  Pt is A&Ox4 and NAD.

## 2021-03-03 NOTE — ED Notes (Signed)
Ct notified that pt has 20g iv for ct scan.

## 2021-03-23 ENCOUNTER — Other Ambulatory Visit: Payer: Self-pay | Admitting: Internal Medicine

## 2021-03-23 NOTE — Telephone Encounter (Signed)
Pharmacy needs to contact PCP

## 2021-11-10 DEATH — deceased

## 2022-06-10 ENCOUNTER — Encounter (INDEPENDENT_AMBULATORY_CARE_PROVIDER_SITE_OTHER): Payer: Self-pay
